# Patient Record
Sex: Female | Born: 1950
Health system: Southern US, Community
[De-identification: ages and names within clinical notes are randomized; demographics above are authoritative.]

## PROBLEM LIST (undated history)

## (undated) DIAGNOSIS — L509 Urticaria, unspecified: Secondary | ICD-10-CM

## (undated) DIAGNOSIS — R55 Syncope and collapse: Secondary | ICD-10-CM

## (undated) DIAGNOSIS — M1990 Unspecified osteoarthritis, unspecified site: Secondary | ICD-10-CM

## (undated) DIAGNOSIS — I6523 Occlusion and stenosis of bilateral carotid arteries: Secondary | ICD-10-CM

## (undated) DIAGNOSIS — E119 Type 2 diabetes mellitus without complications: Secondary | ICD-10-CM

## (undated) DIAGNOSIS — K219 Gastro-esophageal reflux disease without esophagitis: Secondary | ICD-10-CM

## (undated) DIAGNOSIS — E669 Obesity, unspecified: Secondary | ICD-10-CM

## (undated) DIAGNOSIS — D649 Anemia, unspecified: Secondary | ICD-10-CM

## (undated) DIAGNOSIS — I10 Essential (primary) hypertension: Secondary | ICD-10-CM

## (undated) DIAGNOSIS — R011 Cardiac murmur, unspecified: Secondary | ICD-10-CM

## (undated) DIAGNOSIS — H9319 Tinnitus, unspecified ear: Secondary | ICD-10-CM

## (undated) HISTORY — DX: Gastro-esophageal reflux disease without esophagitis: K21.9

## (undated) HISTORY — DX: Essential (primary) hypertension: I10

## (undated) HISTORY — DX: Obesity, unspecified: E66.9

## (undated) HISTORY — DX: Syncope and collapse: R55

## (undated) HISTORY — DX: Urticaria, unspecified: L50.9

---

## 1985-10-24 HISTORY — PX: TUBAL LIGATION: SHX77

## 1993-04-20 DIAGNOSIS — E669 Obesity, unspecified: Secondary | ICD-10-CM

## 1993-04-20 HISTORY — DX: Obesity, unspecified: E66.9

## 1993-11-11 DIAGNOSIS — I10 Essential (primary) hypertension: Secondary | ICD-10-CM

## 1993-11-11 HISTORY — DX: Essential (primary) hypertension: I10

## 2000-01-26 DIAGNOSIS — E1165 Type 2 diabetes mellitus with hyperglycemia: Secondary | ICD-10-CM | POA: Insufficient documentation

## 2001-04-02 ENCOUNTER — Other Ambulatory Visit: Admission: RE | Admit: 2001-04-02 | Discharge: 2001-04-02 | Payer: Self-pay | Admitting: Family Medicine

## 2001-04-02 DIAGNOSIS — K219 Gastro-esophageal reflux disease without esophagitis: Secondary | ICD-10-CM

## 2001-04-02 HISTORY — DX: Gastro-esophageal reflux disease without esophagitis: K21.9

## 2004-11-29 ENCOUNTER — Ambulatory Visit: Payer: Self-pay | Admitting: General Surgery

## 2005-12-01 DIAGNOSIS — D509 Iron deficiency anemia, unspecified: Secondary | ICD-10-CM | POA: Insufficient documentation

## 2006-02-10 ENCOUNTER — Ambulatory Visit: Payer: Self-pay | Admitting: General Surgery

## 2006-05-26 ENCOUNTER — Ambulatory Visit: Payer: Self-pay | Admitting: Internal Medicine

## 2008-11-25 DIAGNOSIS — Z8249 Family history of ischemic heart disease and other diseases of the circulatory system: Secondary | ICD-10-CM | POA: Insufficient documentation

## 2008-11-28 DIAGNOSIS — R519 Headache, unspecified: Secondary | ICD-10-CM | POA: Insufficient documentation

## 2008-12-02 ENCOUNTER — Ambulatory Visit: Payer: Self-pay | Admitting: Family Medicine

## 2008-12-08 ENCOUNTER — Ambulatory Visit: Payer: Self-pay | Admitting: Family Medicine

## 2009-01-20 ENCOUNTER — Ambulatory Visit: Payer: Self-pay

## 2009-01-20 DIAGNOSIS — R55 Syncope and collapse: Secondary | ICD-10-CM

## 2009-01-20 HISTORY — DX: Syncope and collapse: R55

## 2009-02-17 ENCOUNTER — Ambulatory Visit: Payer: Self-pay | Admitting: General Surgery

## 2009-02-17 LAB — HM COLONOSCOPY

## 2009-04-15 ENCOUNTER — Ambulatory Visit: Payer: Self-pay | Admitting: Family Medicine

## 2009-04-16 ENCOUNTER — Ambulatory Visit: Payer: Self-pay | Admitting: Family Medicine

## 2009-06-24 DIAGNOSIS — D649 Anemia, unspecified: Secondary | ICD-10-CM | POA: Insufficient documentation

## 2009-06-24 DIAGNOSIS — R141 Gas pain: Secondary | ICD-10-CM | POA: Insufficient documentation

## 2010-01-12 DIAGNOSIS — R0989 Other specified symptoms and signs involving the circulatory and respiratory systems: Secondary | ICD-10-CM | POA: Insufficient documentation

## 2010-01-13 ENCOUNTER — Ambulatory Visit: Payer: Self-pay

## 2010-03-01 ENCOUNTER — Ambulatory Visit: Payer: Self-pay | Admitting: Gastroenterology

## 2010-03-09 ENCOUNTER — Ambulatory Visit: Payer: Self-pay | Admitting: Gastroenterology

## 2010-03-12 DIAGNOSIS — R634 Abnormal weight loss: Secondary | ICD-10-CM | POA: Insufficient documentation

## 2010-03-12 DIAGNOSIS — R209 Unspecified disturbances of skin sensation: Secondary | ICD-10-CM | POA: Insufficient documentation

## 2010-06-17 HISTORY — PX: OTHER SURGICAL HISTORY: SHX169

## 2012-03-21 ENCOUNTER — Ambulatory Visit: Payer: Self-pay | Admitting: Family Medicine

## 2013-02-07 ENCOUNTER — Ambulatory Visit: Payer: Self-pay | Admitting: Family Medicine

## 2013-08-22 ENCOUNTER — Ambulatory Visit: Payer: Self-pay | Admitting: Family Medicine

## 2015-03-30 ENCOUNTER — Other Ambulatory Visit: Payer: Self-pay | Admitting: Family Medicine

## 2015-03-30 DIAGNOSIS — I10 Essential (primary) hypertension: Secondary | ICD-10-CM

## 2015-03-30 DIAGNOSIS — E7800 Pure hypercholesterolemia, unspecified: Secondary | ICD-10-CM | POA: Insufficient documentation

## 2015-04-29 ENCOUNTER — Telehealth: Payer: Self-pay

## 2015-04-29 ENCOUNTER — Encounter: Payer: Self-pay | Admitting: Family Medicine

## 2015-04-29 ENCOUNTER — Ambulatory Visit: Payer: Self-pay | Admitting: Physician Assistant

## 2015-04-29 ENCOUNTER — Ambulatory Visit (INDEPENDENT_AMBULATORY_CARE_PROVIDER_SITE_OTHER): Payer: 59 | Admitting: Family Medicine

## 2015-04-29 VITALS — BP 128/70 | HR 60 | Temp 98.1°F | Resp 16 | Ht 64.0 in | Wt 177.0 lb

## 2015-04-29 DIAGNOSIS — H9313 Tinnitus, bilateral: Secondary | ICD-10-CM | POA: Insufficient documentation

## 2015-04-29 DIAGNOSIS — R079 Chest pain, unspecified: Secondary | ICD-10-CM | POA: Diagnosis not present

## 2015-04-29 DIAGNOSIS — R591 Generalized enlarged lymph nodes: Secondary | ICD-10-CM | POA: Insufficient documentation

## 2015-04-29 DIAGNOSIS — D709 Neutropenia, unspecified: Secondary | ICD-10-CM | POA: Diagnosis not present

## 2015-04-29 DIAGNOSIS — F321 Major depressive disorder, single episode, moderate: Secondary | ICD-10-CM | POA: Diagnosis not present

## 2015-04-29 DIAGNOSIS — K219 Gastro-esophageal reflux disease without esophagitis: Secondary | ICD-10-CM

## 2015-04-29 DIAGNOSIS — E7800 Pure hypercholesterolemia, unspecified: Secondary | ICD-10-CM | POA: Insufficient documentation

## 2015-04-29 DIAGNOSIS — F419 Anxiety disorder, unspecified: Secondary | ICD-10-CM | POA: Insufficient documentation

## 2015-04-29 DIAGNOSIS — R58 Hemorrhage, not elsewhere classified: Secondary | ICD-10-CM | POA: Insufficient documentation

## 2015-04-29 DIAGNOSIS — R599 Enlarged lymph nodes, unspecified: Secondary | ICD-10-CM | POA: Insufficient documentation

## 2015-04-29 DIAGNOSIS — E559 Vitamin D deficiency, unspecified: Secondary | ICD-10-CM | POA: Insufficient documentation

## 2015-04-29 DIAGNOSIS — R0989 Other specified symptoms and signs involving the circulatory and respiratory systems: Secondary | ICD-10-CM | POA: Insufficient documentation

## 2015-04-29 MED ORDER — RANITIDINE HCL 150 MG PO TABS: ORAL_TABLET | ORAL | Status: DC

## 2015-04-29 NOTE — Progress Notes (Signed)
Subjective:    Patient ID: Virginia Weaver, female    DOB: 1951-07-12, 64 y.o.   MRN: [id number]  Chest Pain  This is a new problem. The current episode started in the past 7 days (Has been going on for about three days. ). The onset quality is sudden. The problem occurs daily (The last three days, worse at night or late afternoon. ). The problem has been waxing and waning. The pain is present in the substernal region. The pain is at a severity of 1/10 (Last night was 8/10.). The pain is mild. The quality of the pain is described as pressure. The pain radiates to the lower back. Associated symptoms include back pain, a cough, dizziness, headaches, numbness (Left arm, but only yesterday.) and palpitations. Pertinent negatives include no abdominal pain, diaphoresis, fever, nausea, shortness of breath, vomiting or weakness. The pain is aggravated by nothing. She has tried antacids for the symptoms. The treatment provided no relief.  Pertinent negatives for past medical history include no seizures.   Patient Active Problem List   Diagnosis Date Noted  . Anxiety 04/29/2015  . Weak pulse 04/29/2015  . Ecchymosis 04/29/2015  . Adenopathy 04/29/2015  . Depression, major, single episode, moderate 04/29/2015  . Hypercholesterolemia without hypertriglyceridemia 04/29/2015  . Bilateral tinnitus 04/29/2015  . Avitaminosis D 04/29/2015  . Hypertension 03/30/2015  . Hypercholesteremia 03/30/2015  . Abnormal loss of weight 03/12/2010  . Disturbance of skin sensation 03/12/2010  . Cardiovascular symptoms 01/12/2010  . Absolute anemia 06/24/2009  . Flatulence, eructation and gas pain 06/24/2009  . Syncope and collapse 01/20/2009  . Cephalalgia 11/28/2008  . Fam hx-ischem heart disease 11/25/2008  . Anemia, iron deficiency 12/01/2005  . Acid reflux 04/02/2001  . Diabetes mellitus type 2, uncontrolled 01/26/2000  . Benign essential HTN 11/11/1993  . Adiposity 04/20/1993   Family History  Problem  Relation Age of Onset  . Heart attack Mother   . Stroke Mother   . Diabetes Mother   . Throat cancer Mother   . Heart attack Father   . Hypertension Father   . Glaucoma Father   . AAA (abdominal aortic aneurysm) Father   . Obesity Sister   . Throat cancer Maternal Grandmother   . Heart attack Maternal Grandfather   . CVA Paternal Grandmother   . Congestive Heart Failure Paternal Grandmother   . Heart attack Paternal Grandfather    History   Social History  . Marital Status: Widowed    Spouse Name: N/A  . Number of Children: 2  . Years of Education: H/S   Occupational History  . [employer] [place] History Main Topics  . Smoking status: Never Smoker   . Smokeless tobacco: Never Used  . Alcohol Use: No  . Drug Use: No  . Sexual Activity: Not on file   Other Topics Concern  . Not on file   Social History Narrative   Past Surgical History  Procedure Laterality Date  . Tubal ligation  1987  . Tummy tuck  06/17/2010   Allergies  Allergen Reactions  . Nsaids     Can't take NSAIDS secondary to erosions due to NSAIDS.   Previous Medications   ALPRAZOLAM (XANAX) 0.5 MG TABLET    Take 1 tablet by mouth every 8 (eight) hours as needed.   BIOFLAVONOID PRODUCTS PO    1 tablet daily.   CALCIUM CARBONATE-VITAMIN D 600-200 MG-UNIT CAPS    1 tablet daily.  CHOLECALCIFEROL 1000 UNITS CAPSULE    Take 1 capsule by mouth daily.   CYANOCOBALAMIN 1000 MCG TABLET    Take 1 tablet by mouth daily.   HORSE CHESTNUT 300 MG CAPS    Take 1 capsule by mouth 2 (two) times daily.   LEVOCETIRIZINE (XYZAL) 5 MG TABLET    Take 1 tablet by mouth daily.   MONTELUKAST (SINGULAIR) 10 MG TABLET    Take 10 mg by mouth daily.   OMEGA-3 FATTY ACIDS PO    Take 1 capsule by mouth daily.   PRAVASTATIN (PRAVACHOL) 20 MG TABLET    TAKE 1 TABLET BY MOUTH AT BEDTIME   RAMIPRIL (ALTACE) 2.5 MG CAPSULE    TAKE 1 CAPSULE BY MOUTH EVERY DAY   SERTRALINE (ZOLOFT) 50 MG TABLET    Take  50 mg by mouth daily.   BP 128/70 mmHg  Pulse 60  Temp(Src) 98.1 F (36.7 C) (Oral)  Resp 16  Ht 5\' 4"  (1.626 m)  Wt 177 lb (80.287 kg)  BMI 30.37 kg/m2    Review of Systems  Constitutional: Positive for fatigue. Negative for fever, chills, diaphoresis, activity change, appetite change and unexpected weight change.  HENT: Positive for tinnitus.   Respiratory: Positive for cough and chest tightness. Negative for apnea, choking, shortness of breath, wheezing and stridor.   Cardiovascular: Positive for chest pain, palpitations and leg swelling.  Gastrointestinal: Negative for nausea, vomiting, abdominal pain, diarrhea, constipation, blood in stool, abdominal distention, anal bleeding and rectal pain.  Musculoskeletal: Positive for back pain.  Neurological: Positive for dizziness, numbness (Left arm, but only yesterday.) and headaches. Negative for tremors, seizures, syncope, facial asymmetry, speech difficulty, weakness and light-headedness.  Psychiatric/Behavioral: Negative for agitation. The patient is nervous/anxious (Father recently past away last week.  ).        Objective:   Physical Exam  Constitutional: She is oriented to person, place, and time. She appears well-developed and well-nourished.  Cardiovascular: Normal rate and regular rhythm.   Pulmonary/Chest: Effort normal and breath sounds normal.  Neurological: She is alert and oriented to person, place, and time.  Psychiatric: She has a normal mood and affect. Her behavior is normal. Thought content normal.   BP 128/70 mmHg  Pulse 60  Temp(Src) 98.1 F (36.7 C) (Oral)  Resp 16  Ht 5\' 4"  (1.626 m)  Wt 177 lb (80.287 kg)  BMI 30.37 kg/m2       Assessment & Plan:   1. Chest pain, unspecified chest pain type EKG normal today. No new symptoms. Will follow up with cardiologist as scheduled.  Will call if worsens or does not improve.  - EKG 12-Lead  2. Neutropenia Check labs.  - CBC with  Differential/Platelet  3. Gastroesophageal reflux disease without esophagitis Will start Zantac short term and taper as tolerated. Will call if worsens or does not improve.  4. Depression, major, single episode, moderate Currently stable. Will continue current medication.  Will call if does not continue to improve.    Margarita Rana, MD

## 2015-04-29 NOTE — Telephone Encounter (Signed)
Pt called c/o "tight" chest pain. Pt reports her dad recently died, and believes this pain is secondary to stress. Pt reports she is also experiencing left arm "tingling", and nausea (pt's baseline due to vertigo). Pt denies SOB, jaw pain, neck pain, crushing chest sensation, vomiting, cold sweats. Scheduled appointment with Dr. Venia Minks. Advised pt to go to ED if sx appear/worsen. Pt agrees. Renaldo Fiddler, CMA

## 2015-05-18 ENCOUNTER — Other Ambulatory Visit: Payer: Self-pay | Admitting: Family Medicine

## 2015-05-19 NOTE — Telephone Encounter (Signed)
She is requesting refill on Xanax.

## 2015-05-19 NOTE — Telephone Encounter (Signed)
Ok to call in rx.  Thanks.  

## 2015-06-04 ENCOUNTER — Ambulatory Visit (INDEPENDENT_AMBULATORY_CARE_PROVIDER_SITE_OTHER): Payer: 59 | Admitting: Family Medicine

## 2015-06-04 ENCOUNTER — Encounter: Payer: Self-pay | Admitting: Family Medicine

## 2015-06-04 VITALS — BP 134/66 | HR 58 | Temp 98.0°F | Resp 16 | Wt 178.4 lb

## 2015-06-04 DIAGNOSIS — L509 Urticaria, unspecified: Secondary | ICD-10-CM

## 2015-06-04 DIAGNOSIS — S8010XA Contusion of unspecified lower leg, initial encounter: Secondary | ICD-10-CM | POA: Insufficient documentation

## 2015-06-04 DIAGNOSIS — S8012XA Contusion of left lower leg, initial encounter: Secondary | ICD-10-CM

## 2015-06-04 HISTORY — DX: Urticaria, unspecified: L50.9

## 2015-06-04 MED ORDER — HYDROXYZINE HCL 25 MG PO TABS: 25.0000 mg | ORAL_TABLET | Freq: Three times a day (TID) | ORAL | Status: DC | PRN

## 2015-06-04 NOTE — Progress Notes (Signed)
Subjective:     Patient ID: Virginia Weaver, female   DOB: 07/19/1951, 64 y.o.   MRN: [id number]  HPI  Chief Complaint  Patient presents with  . Cyst    left calf, on June the 28th I hurted with the [employer]  . Follow-up    itching all over, hands, back of neck and feet, started about 3 weeks ago.  She reports increased duress in her life as her father was killed in a motor vehicle accident at the end of June. Reports intermittent itchy bumps have been occurring over the last 3 weeks. She had been cleaning out her dad's house when she bumped her calf with a lawnmower. Remains on Xyzal and takes Xanax once daily.   Review of Systems     Objective:   Physical Exam  Constitutional: She appears well-developed and well-nourished. No distress.  Pulmonary/Chest: Breath sounds normal.  Skin:  Left posterior calf with 4 cm tender mass c/w hematoma. Dependent ecchymosis is noted distally. Two non-specific papules are noted on her upper extremity.       Assessment:    1. Hematoma of leg, left, initial encounter  2. Urticaria: c/w stress/grief reaction - hydrOXYzine (ATARAX/VISTARIL) 25 MG tablet; Take 1 tablet (25 mg total) by mouth 3 (three) times daily as needed.  Dispense: 30 tablet; Refill: 0    Plan:    Continue Xyzal and increase Xanax to twice daily. May use hydroxyzine as needed for rash recurrence. Discussed warm compresses to left calf hematoma.

## 2015-06-04 NOTE — Patient Instructions (Signed)
Increase Xanax to twice daily. May use hydroxyzine as needed for hives. Continue Xyzal.

## 2015-06-05 ENCOUNTER — Telehealth: Payer: Self-pay

## 2015-06-05 LAB — CBC WITH DIFFERENTIAL/PLATELET
BASOS ABS: 0 10*3/uL (ref 0.0–0.2)
Basos: 1 %
EOS (ABSOLUTE): 0.2 10*3/uL (ref 0.0–0.4)
Eos: 4 %
Hematocrit: 41.3 % (ref 34.0–46.6)
Hemoglobin: 13.7 g/dL (ref 11.1–15.9)
IMMATURE GRANS (ABS): 0 10*3/uL (ref 0.0–0.1)
IMMATURE GRANULOCYTES: 0 %
LYMPHS: 28 %
Lymphocytes Absolute: 1.3 10*3/uL (ref 0.7–3.1)
MCH: 30.4 pg (ref 26.6–33.0)
MCHC: 33.2 g/dL (ref 31.5–35.7)
MCV: 92 fL (ref 79–97)
MONOCYTES: 8 %
Monocytes Absolute: 0.4 10*3/uL (ref 0.1–0.9)
Neutrophils Absolute: 2.8 10*3/uL (ref 1.4–7.0)
Neutrophils: 59 %
Platelets: 192 10*3/uL (ref 150–379)
RBC: 4.5 x10E6/uL (ref 3.77–5.28)
RDW: 12.9 % (ref 12.3–15.4)
WBC: 4.7 10*3/uL (ref 3.4–10.8)

## 2015-06-05 NOTE — Telephone Encounter (Signed)
-----   Message from Margarita Rana, MD sent at 06/05/2015  9:13 AM EDT ----- Labs stable.  Please notify patient. Thanks.

## 2015-06-05 NOTE — Telephone Encounter (Signed)
Advised pt of lab results. Pt verbally acknowledges understanding. Shrita Thien Drozdowski, CMA   

## 2015-06-18 ENCOUNTER — Other Ambulatory Visit: Payer: Self-pay | Admitting: Family Medicine

## 2015-07-30 ENCOUNTER — Ambulatory Visit (INDEPENDENT_AMBULATORY_CARE_PROVIDER_SITE_OTHER): Payer: 59 | Admitting: Family Medicine

## 2015-07-30 ENCOUNTER — Encounter: Payer: Self-pay | Admitting: Family Medicine

## 2015-07-30 VITALS — BP 104/58 | HR 60 | Temp 97.7°F | Resp 16 | Ht 64.0 in | Wt 185.4 lb

## 2015-07-30 DIAGNOSIS — M222X1 Patellofemoral disorders, right knee: Secondary | ICD-10-CM | POA: Diagnosis not present

## 2015-07-30 DIAGNOSIS — M7051 Other bursitis of knee, right knee: Secondary | ICD-10-CM | POA: Diagnosis not present

## 2015-07-30 MED ORDER — CELECOXIB 200 MG PO CAPS: 200.0000 mg | ORAL_CAPSULE | Freq: Every day | ORAL | Status: DC

## 2015-07-30 NOTE — Patient Instructions (Signed)
Continue with tylenol as needed and ice your knee for 20 minutes after you get home from work.

## 2015-07-30 NOTE — Progress Notes (Signed)
Subjective:     Patient ID: Virginia Weaver, female   DOB: 09/14/1951, 64 y.o.   MRN: [id number]  HPI  Chief Complaint  Patient presents with  . Knee Pain    Patient comes in office today with concerns of knee pain for several weeks. 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 Patient reports taking otc Tylenol   Continues to have pain with weight bearing especially when going up and down stairs and when she moves her knee laterally: "I have to lift my leg to get out of my car." Has been avoiding nsaid's due to remote hx of ulcer. No prior knee injury or surgery reported but remembers knee injections in the past.   Review of Systems  Musculoskeletal:       Wearing a knee sleeve today which does not help.       Objective:   Physical Exam  Constitutional: She appears well-developed and well-nourished. She appears distressed (mild due to knee pain).  Musculoskeletal:  Antalgic gait. Can not flex her right knee>  90 degrees without pain .Minimal effusion/no erythema. Tender over patella and right lateral knee. Ligaments stable with no increased pain on collateral ligament testing.       Assessment:    1. Patellofemoral disorder, right - celecoxib (CELEBREX) 200 MG capsule; Take 1 capsule (200 mg total) by mouth daily.  Dispense: 15 capsule; Refill: 0  2. Bursitis, knee, right - celecoxib (CELEBREX) 200 MG capsule; Take 1 capsule (200 mg total) by mouth daily.  Dispense: 15 capsule; Refill: 0    Plan:    Ice for 20 minutes after work. If not improving after a week to call for x-ray and referral.

## 2015-08-11 ENCOUNTER — Other Ambulatory Visit: Payer: Self-pay | Admitting: Family Medicine

## 2015-08-11 DIAGNOSIS — I10 Essential (primary) hypertension: Secondary | ICD-10-CM

## 2015-08-11 DIAGNOSIS — E7800 Pure hypercholesterolemia, unspecified: Secondary | ICD-10-CM

## 2015-09-28 ENCOUNTER — Other Ambulatory Visit: Payer: Self-pay | Admitting: Family Medicine

## 2015-11-10 ENCOUNTER — Ambulatory Visit (INDEPENDENT_AMBULATORY_CARE_PROVIDER_SITE_OTHER): Payer: 59 | Admitting: Physician Assistant

## 2015-11-10 ENCOUNTER — Encounter: Payer: Self-pay | Admitting: Physician Assistant

## 2015-11-10 VITALS — BP 120/60 | HR 65 | Temp 97.6°F | Resp 16 | Wt 191.4 lb

## 2015-11-10 DIAGNOSIS — R300 Dysuria: Secondary | ICD-10-CM | POA: Diagnosis not present

## 2015-11-10 DIAGNOSIS — N390 Urinary tract infection, site not specified: Secondary | ICD-10-CM | POA: Diagnosis not present

## 2015-11-10 DIAGNOSIS — IMO0001 Reserved for inherently not codable concepts without codable children: Secondary | ICD-10-CM

## 2015-11-10 DIAGNOSIS — L409 Psoriasis, unspecified: Secondary | ICD-10-CM | POA: Diagnosis not present

## 2015-11-10 DIAGNOSIS — R350 Frequency of micturition: Secondary | ICD-10-CM

## 2015-11-10 LAB — POCT URINALYSIS DIPSTICK
Bilirubin, UA: NEGATIVE
Glucose, UA: NEGATIVE
KETONES UA: NEGATIVE
Leukocytes, UA: NEGATIVE
Nitrite, UA: NEGATIVE
PH UA: 5
PROTEIN UA: NEGATIVE
RBC UA: NEGATIVE
SPEC GRAV UA: 1.015
UROBILINOGEN UA: 0.2

## 2015-11-10 LAB — POCT GLYCOSYLATED HEMOGLOBIN (HGB A1C): HEMOGLOBIN A1C: 5.6

## 2015-11-10 MED ORDER — TRIAMCINOLONE ACETONIDE 0.1 % EX CREA: 1.0000 "application " | TOPICAL_CREAM | Freq: Two times a day (BID) | CUTANEOUS | Status: DC

## 2015-11-10 MED ORDER — PHENAZOPYRIDINE HCL 200 MG PO TABS: 200.0000 mg | ORAL_TABLET | Freq: Three times a day (TID) | ORAL | Status: DC | PRN

## 2015-11-10 NOTE — Progress Notes (Signed)
Patient: Virginia Weaver Female    DOB: [birth year]   65 y.o.   MRN: [id number] Visit Date: 11/10/2015  Today's Provider: [name], PA-C   Chief Complaint  Patient presents with  . Urinary Tract Infection    Patient concer about burning when voiding and frequency   Subjective:    Urinary Tract Infection  This is a new problem. Episode onset: Has been going for a while at least 6 months. The problem occurs every urination. The quality of the pain is described as burning. The pain is at a severity of 8/10. There has been no fever. Associated symptoms include frequency and urgency. Pertinent negatives include no discharge, flank pain, hematuria, nausea or vomiting. She has tried increased fluids and acetaminophen (Patient thinks needs a referral to the Urologist) for the symptoms. The treatment provided no relief.  She has had multiple urine checks which have all been negative for UTI.    Allergies  Allergen Reactions  . Nsaids     Can't take NSAIDS secondary to erosions due to NSAIDS.   Previous Medications   ALPRAZOLAM (XANAX) 0.5 MG TABLET    TAKE 1 TABLET BY MOUTH EVERY 8 HOURS AS NEEDED   BIOFLAVONOID PRODUCTS PO    1 tablet daily.   CALCIUM CARBONATE-VITAMIN D 600-200 MG-UNIT CAPS    1 tablet daily.   CELECOXIB (CELEBREX) 200 MG CAPSULE    Take 1 capsule (200 mg total) by mouth daily.   CHOLECALCIFEROL 1000 UNITS CAPSULE    Take 1 capsule by mouth daily.   CYANOCOBALAMIN 1000 MCG TABLET    Take 1 tablet by mouth daily.   HORSE CHESTNUT 300 MG CAPS    Take 1 capsule by mouth 2 (two) times daily.   HYDROXYZINE (ATARAX/VISTARIL) 25 MG TABLET    Take 1 tablet (25 mg total) by mouth every 8 (eight) hours as needed (for hives/itching).   LEVOCETIRIZINE (XYZAL) 5 MG TABLET    Take 1 tablet by mouth daily.   MONTELUKAST (SINGULAIR) 10 MG TABLET    Take 10 mg by mouth daily.   OMEGA-3 FATTY ACIDS PO    Take 1 capsule by mouth daily.   PRAVASTATIN (PRAVACHOL) 20 MG  TABLET    TAKE 1 TABLET BY MOUTH AT BEDTIME   RAMIPRIL (ALTACE) 2.5 MG CAPSULE    TAKE 1 CAPSULE BY MOUTH EVERY DAY   RANITIDINE (ZANTAC) 150 MG TABLET    Two times daily for 1 to 2 weeks and then taper as tolerated.   SERTRALINE (ZOLOFT) 50 MG TABLET    Take 50 mg by mouth daily.    Review of Systems  Constitutional: Negative.   HENT: Negative.   Eyes: Negative.   Respiratory: Negative.   Cardiovascular: Negative.   Gastrointestinal: Negative for nausea, vomiting, abdominal pain, diarrhea and constipation.  Endocrine: Negative.   Genitourinary: Positive for dysuria, urgency and frequency. Negative for hematuria, flank pain, decreased urine volume, vaginal bleeding, vaginal discharge, enuresis, difficulty urinating, genital sores, vaginal pain, menstrual problem and pelvic pain.  Musculoskeletal: Negative.   Skin: Positive for rash (psoriasis).  Allergic/Immunologic: Negative.   Neurological: Negative.   Hematological: Negative.   Psychiatric/Behavioral: Negative.     Social History  Substance Use Topics  . Smoking status: Never Smoker   . Smokeless tobacco: Never Used  . Alcohol Use: No   Objective:   BP 120/60 mmHg  Pulse 65  Temp(Src) 97.6 F (36.4 C) (Oral)  Resp 16  Wt 191 lb 6.4 oz (86.818 kg)  SpO2 97%  Physical Exam  Constitutional: She is oriented to person, place, and time. She appears well-developed and well-nourished. No distress.  Cardiovascular: Normal rate, regular rhythm and normal heart sounds.  Exam reveals no gallop and no friction rub.   No murmur heard. Pulmonary/Chest: Effort normal and breath sounds normal. No respiratory distress. She has no wheezes. She has no rales.  Abdominal: Soft. Normal appearance and bowel sounds are normal. She exhibits no shifting dullness, no distension, no abdominal bruit, no ascites, no pulsatile midline mass and no mass. There is no hepatosplenomegaly. There is no tenderness. There is no rebound, no guarding and no CVA  tenderness.  Neurological: She is alert and oriented to person, place, and time.  Skin: Skin is warm and dry. Rash noted. She is not diaphoretic.     Vitals reviewed.       Assessment & Plan:     1. Urinary tract infection without hematuria, site unspecified UA was normal.  Will still send for culture due to symptoms. Will give pyridium for discomfort to see if that helps.  She is asking for referral to Urology for further evaluation.  I did discuss with her it could be OAB, interstitial cystitis, or estrogen deficient vaginitis as well.  I will refer her to Urology for further evaluation and treatment. I will add antibiotic treatment if necessary depending on culture results. She is to call if symptoms worsen in the meantime.  - POCT urinalysis dipstick  2. Dysuria See above medical treatment plan. - Urine culture - Ambulatory referral to Urology - phenazopyridine (PYRIDIUM) 200 MG tablet; Take 1 tablet (200 mg total) by mouth 3 (three) times daily as needed for pain.  Dispense: 10 tablet; Refill: 0  3. Frequency See above medical treatment plan. HgBA1c was 5.6. - POCT HgB A1C  4. Psoriasis Will give triamcinolone cream for annular psoriasis flare that is located on left upper arm.  She is to call if symptoms worsen. - triamcinolone cream (KENALOG) 0.1 %; Apply 1 application topically 2 (two) times daily.  Dispense: 30 g; Refill: 0       [name], PA-C  [facility]

## 2015-11-10 NOTE — Patient Instructions (Addendum)
Interstitial Cystitis Interstitial cystitis is a condition that causes inflammation of the bladder. The bladder is a hollow organ in the lower part of your abdomen. It stores urine after the urine is made by your kidneys. With interstitial cystitis, you may have pain in the bladder area. You may also have a frequent and urgent need to urinate. The severity of interstitial cystitis can vary from person to person. You may have flare-ups of the condition, and then it may go away for a while. For many people who have this condition, it becomes a long-term problem. CAUSES The cause of this condition is not known. RISK FACTORS This condition is more likely to develop in women. SYMPTOMS Symptoms of interstitial cystitis vary, and they can change over time. Symptoms may include:  Discomfort or pain in the bladder area. This can range from mild to severe. The pain may change in intensity as the bladder fills with urine or as it empties.  Pelvic pain.  An urgent need to urinate.  Frequent urination.  Pain during sexual intercourse.  Pinpoint bleeding on the bladder wall. For women, the symptoms often get worse during menstruation. DIAGNOSIS This condition is diagnosed by evaluating your symptoms and ruling out other causes. A physical exam will be done. Various tests may be done to rule out other conditions. Common tests include:  Urine tests.  Cystoscopy. In this test, a tool that is like a very thin telescope is used to look into your bladder.  Biopsy. This involves taking a sample of tissue from the bladder wall to be examined under a microscope. TREATMENT There is no cure for interstitial cystitis, but treatment methods are available to control your symptoms. Work closely with your health care provider to find the treatments that will be most effective for you. Treatment options may include:  Medicines to relieve pain and to help reduce the number of times that you feel the need to  urinate.  Bladder training. This involves learning ways to control when you urinate, such as:  Urinating at scheduled times.  Training yourself to delay urination.  Doing exercises (Kegel exercises) to strengthen the muscles that control urine flow.  Lifestyle changes, such as changing your diet or taking steps to control stress.  Use of a device that provides electrical stimulation in order to reduce pain.  A procedure that stretches your bladder by filling it with air or fluid.  Surgery. This is rare. It is only done for extreme cases if other treatments do not help. HOME CARE INSTRUCTIONS  Take medicines only as directed by your health care provider.  Use bladder training techniques as directed.  Keep a bladder diary to find out which foods, liquids, or activities make your symptoms worse.  Use your bladder diary to schedule bathroom trips. If you are away from home, plan to be near a bathroom at each of your scheduled times.  Make sure you urinate just before you leave the house and just before you go to bed.  Do Kegel exercises as directed by your health care provider.  Do not drink alcohol.  Do not use any tobacco products, including cigarettes, chewing tobacco, or electronic cigarettes. If you need help quitting, ask your health care provider.  Make dietary changes as directed by your health care provider. You may need to avoid spicy foods and foods that contain a high amount of potassium.  Limit your drinking of beverages that stimulate urination. These include soda, coffee, and tea.  Keep all follow-up   visits as directed by your health care provider. This is important. SEEK MEDICAL CARE IF:  Your symptoms do not get better after treatment.  Your pain and discomfort are getting worse.  You have more frequent urges to urinate.  You have a fever. SEEK IMMEDIATE MEDICAL CARE IF:  You are not able to control your bladder at all.   This information is not  intended to replace advice given to you by your health care provider. Make sure you discuss any questions you have with your health care provider.   Document Released: 06/10/2004 Document Revised: 10/31/2014 Document Reviewed: 06/17/2014 Elsevier Interactive Patient Education 2016 [employer]  Dysuria Dysuria is pain or discomfort while urinating. The pain or discomfort may be felt in the tube that carries urine out of the bladder (urethra) or in the surrounding tissue of the genitals. The pain may also be felt in the groin area, lower abdomen, and lower back. You may have to urinate frequently or have the sudden feeling that you have to urinate (urgency). Dysuria can affect both men and women, but is more common in women. Dysuria can be caused by many different things, including:  Urinary tract infection in women.  Infection of the kidney or bladder.  Kidney stones or bladder stones.  Certain sexually transmitted infections (STIs), such as chlamydia.  Dehydration.  Inflammation of the vagina.  Use of certain medicines.  Use of certain soaps or scented products that cause irritation. HOME CARE INSTRUCTIONS Watch your dysuria for any changes. The following actions may help to reduce any discomfort you are feeling:  Drink enough fluid to keep your urine clear or pale yellow.  Empty your bladder often. Avoid holding urine for long periods of time.  After a bowel movement or urination, women should cleanse from front to back, using each tissue only once.  Empty your bladder after sexual intercourse.  Take medicines only as directed by your health care provider.  If you were prescribed an antibiotic medicine, finish it all even if you start to feel better.  Avoid caffeine, tea, and alcohol. They can irritate the bladder and make dysuria worse. In men, alcohol may irritate the prostate.  Keep all follow-up visits as directed by your health care provider. This is important.  If  you had any tests done to find the cause of dysuria, it is your responsibility to obtain your test results. Ask the lab or department performing the test when and how you will get your results. Talk with your health care provider if you have any questions about your results. SEEK MEDICAL CARE IF:  You develop pain in your back or sides.  You have a fever.  You have nausea or vomiting.  You have blood in your urine.  You are not urinating as often as you usually do. SEEK IMMEDIATE MEDICAL CARE IF:  You pain is severe and not relieved with medicines.  You are unable to hold down any fluids.  You or someone else notices a change in your mental function.  You have a rapid heartbeat at rest.  You have shaking or chills.  You feel extremely weak.   This information is not intended to replace advice given to you by your health care provider. Make sure you discuss any questions you have with your health care provider.   Document Released: 07/08/2004 Document Revised: 10/31/2014 Document Reviewed: 06/05/2014 Elsevier Interactive Patient Education [employer].

## 2015-11-11 LAB — URINE CULTURE

## 2015-11-11 LAB — PLEASE NOTE

## 2015-11-12 ENCOUNTER — Ambulatory Visit (INDEPENDENT_AMBULATORY_CARE_PROVIDER_SITE_OTHER): Payer: 59 | Admitting: Obstetrics and Gynecology

## 2015-11-12 ENCOUNTER — Telehealth: Payer: Self-pay

## 2015-11-12 ENCOUNTER — Encounter: Payer: Self-pay | Admitting: Obstetrics and Gynecology

## 2015-11-12 VITALS — BP 132/75 | HR 60 | Resp 16 | Ht 64.0 in | Wt 191.8 lb

## 2015-11-12 DIAGNOSIS — R3129 Other microscopic hematuria: Secondary | ICD-10-CM | POA: Diagnosis not present

## 2015-11-12 DIAGNOSIS — R300 Dysuria: Secondary | ICD-10-CM | POA: Diagnosis not present

## 2015-11-12 LAB — URINALYSIS, COMPLETE

## 2015-11-12 LAB — MICROSCOPIC EXAMINATION: EPITHELIAL CELLS (NON RENAL): NONE SEEN /HPF (ref 0–10)

## 2015-11-12 LAB — BLADDER SCAN AMB NON-IMAGING: Scan Result: 14

## 2015-11-12 MED ORDER — ESTROGENS, CONJUGATED 0.625 MG/GM VA CREA: TOPICAL_CREAM | VAGINAL | Status: DC

## 2015-11-12 NOTE — Progress Notes (Signed)
11/12/2015 1:30 PM   Virginia Weaver 1950-12-31 [id number]  Referring provider: Margarita Rana, MD [address] [place], [place] [address]  Chief Complaint  Patient presents with  . Dysuria  . Establish Care    HPI: Patient is a 65 year old female presenting today as a referral from her primary care provider for symptoms of urinary frequency and dysuria over the last 6 months. Her PCP note patient has repeatedly been evaluated for UTIs but her cultures have been negative. Patient also complains of nocturia at least every hour per night. She denies any gross hematuria, fevers or flank pain. She does not have a history of kidney stones though her daughter does suffer from recurrent episodes of stones. He has tried to increase her fluids and take over the counter Tylenol for her discomfort without relief. No other exacerbating or alleviating factors.  Report mild vaginal irritation.  Never smoker.  No known chemical exposures.   PMH: History reviewed. No pertinent past medical history.  Surgical History: Past Surgical History  Procedure Laterality Date  . Tubal ligation  1987  . Tummy tuck  06/17/2010    Home Medications:    Medication List       This list is accurate as of: 11/12/15  1:30 PM.  Always use your most recent med list.               ALPRAZolam 0.5 MG tablet  Commonly known as:  XANAX  TAKE 1 TABLET BY MOUTH EVERY 8 HOURS AS NEEDED     BIOFLAVONOID PRODUCTS PO  1 tablet daily.     Calcium Carbonate-Vitamin D 600-200 MG-UNIT Caps  1 tablet daily.     Cholecalciferol 1000 units capsule  Take 1 capsule by mouth daily.     conjugated estrogens vaginal cream  Commonly known as:  PREMARIN  Vaginal Estrogen Cream apply a blueberry sized amount to vaginal opening using finger tip nightly for 2 weeks then use 3 times weekly before bed.     cyanocobalamin 1000 MCG tablet  Take 1 tablet by mouth daily.     [place] 300 MG Caps  Take 1  capsule by mouth 2 (two) times daily.     hydrOXYzine 25 MG tablet  Commonly known as:  ATARAX/VISTARIL  Take 1 tablet (25 mg total) by mouth every 8 (eight) hours as needed (for hives/itching).     levocetirizine 5 MG tablet  Commonly known as:  XYZAL  Take 1 tablet by mouth daily.     montelukast 10 MG tablet  Commonly known as:  SINGULAIR  Take 10 mg by mouth daily.     OMEGA-3 FATTY ACIDS PO  Take 1 capsule by mouth daily.     pravastatin 20 MG tablet  Commonly known as:  PRAVACHOL  TAKE 1 TABLET BY MOUTH AT BEDTIME     ramipril 2.5 MG capsule  Commonly known as:  ALTACE  TAKE 1 CAPSULE BY MOUTH EVERY DAY     ranitidine 150 MG tablet  Commonly known as:  ZANTAC  Two times daily for 1 to 2 weeks and then taper as tolerated.     triamcinolone cream 0.1 %  Commonly known as:  KENALOG  Apply 1 application topically 2 (two) times daily.        Allergies:  Allergies  Allergen Reactions  . Nsaids     Can't take NSAIDS secondary to erosions due to NSAIDS.    Family History: Family History  Problem Relation Age  of Onset  . Heart attack Mother   . Stroke Mother   . Diabetes Mother   . Throat cancer Mother   . Heart attack Father   . Hypertension Father   . Glaucoma Father   . AAA (abdominal aortic aneurysm) Father   . Obesity Sister   . Throat cancer Maternal Grandmother   . Heart attack Maternal Grandfather   . CVA Paternal Grandmother   . Congestive Heart Failure Paternal Grandmother   . Heart attack Paternal Grandfather     Social History:  reports that she has never smoked. She has never used smokeless tobacco. She reports that she does not drink alcohol or use illicit drugs.  ROS: UROLOGY Frequent Urination?: Yes Hard to postpone urination?: Yes Burning/pain with urination?: Yes Get up at night to urinate?: Yes Leakage of urine?: No Urine stream starts and stops?: Yes Trouble starting stream?: No Do you have to strain to urinate?: Yes Blood in  urine?: No Urinary tract infection?: No Sexually transmitted disease?: No Injury to kidneys or bladder?: No Painful intercourse?: No Weak stream?: No Currently pregnant?: No Vaginal bleeding?: No Last menstrual period?: n/a  Gastrointestinal Nausea?: No Vomiting?: No Indigestion/heartburn?: No Diarrhea?: No Constipation?: No  Constitutional Fever: No Night sweats?: No Weight loss?: No Fatigue?: No  Skin Skin rash/lesions?: Yes Itching?: No  Eyes Blurred vision?: No Double vision?: No  Ears/Nose/Throat Sore throat?: No Sinus problems?: Yes  Hematologic/Lymphatic Swollen glands?: No Easy bruising?: Yes  Cardiovascular Leg swelling?: Yes Chest pain?: No  Respiratory Cough?: No Shortness of breath?: No  Endocrine Excessive thirst?: No  Musculoskeletal Back pain?: No Joint pain?: No  Neurological Headaches?: Yes Dizziness?: Yes  Psychologic Depression?: No Anxiety?: Yes  Physical Exam: BP 132/75 mmHg  Pulse 60  Resp 16  Ht 5\' 4"  (1.626 m)  Wt 191 lb 12.8 oz (87 kg)  BMI 32.91 kg/m2  Constitutional:  Alert and oriented, No acute distress. HEENT: [place] AT, moist mucus membranes.  Trachea midline, no masses. Cardiovascular: No clubbing, cyanosis, or edema. Respiratory: Normal respiratory effort, no increased work of breathing. GI: Abdomen is soft, nontender, nondistended, no abdominal masses GU: No CVA tenderness.  Pelvic Exam: normal urinary meatus, atrophic vaginal mucosa, good vaginal vault support, no demonstrable SUI Skin: No rashes, bruises or suspicious lesions. Lymph: No cervical or inguinal adenopathy. Neurologic: Grossly intact, no focal deficits, moving all 4 extremities. Psychiatric: Normal mood and affect.  Laboratory Data:   Urinalysis Results for orders placed or performed in visit on 11/12/15  BLADDER SCAN AMB NON-IMAGING  Result Value Ref Range   Scan Result 14 mL      Pertinent Imaging: PVR 70mL  Assessment & Plan:     1. Dysuria- Possibly due to vaginal atrophy and/or other GU pathology. We will start vaginal estrogen as well as pursue a hematuria workup for further evaluation of microscopic hematuria. - Urinalysis, Complete - BLADDER SCAN AMB NON-IMAGING  2. Microsocpic hematuria- We discussed the differential diagnosis for microscopic hematuria including nephrolithiasis, renal or upper tract tumors, bladder stones, UTIs, or bladder tumors as well as undetermined etiologies. Per AUA guidelines, I did recommend complete microscopic hematuria evaluation including CTU, possible urine cytology, and office cystoscopy. -BMP  3. Vaginal Atrophy- Prescription for vaginal estrogen cream sent to patient's pharmacy.   Return for CT Urogram results and cystoscopy.  These notes generated with voice recognition software. I apologize for typographical errors.  Herbert Moors, [facility] [address], [place], [place]  [address] [phone]   

## 2015-11-12 NOTE — Telephone Encounter (Signed)
Patient advised as directed below.  Thanks,  -Joseline 

## 2015-11-12 NOTE — Patient Instructions (Signed)
Hematuria, Adult  Hematuria is blood in your urine. It can be caused by a bladder infection, kidney infection, prostate infection, kidney stone, or cancer of your urinary tract. Infections can usually be treated with medicine, and a kidney stone usually will pass through your urine. If neither of these is the cause of your hematuria, further workup to find out the reason may be needed.  It is very important that you tell your health care provider about any blood you see in your urine, even if the blood stops without treatment or happens without causing pain. Blood in your urine that happens and then stops and then happens again can be a symptom of a very serious condition. Also, pain is not a symptom in the initial stages of many urinary cancers.  HOME CARE INSTRUCTIONS   · Drink lots of fluid, 3-4 quarts a day. If you have been diagnosed with an infection, cranberry juice is especially recommended, in addition to large amounts of water.  · Avoid caffeine, tea, and carbonated beverages because they tend to irritate the bladder.  · Avoid alcohol because it may irritate the prostate.  · Take all medicines as directed by your health care provider.  · If you were prescribed an antibiotic medicine, finish it all even if you start to feel better.  · If you have been diagnosed with a kidney stone, follow your health care provider's instructions regarding straining your urine to catch the stone.  · Empty your bladder often. Avoid holding urine for long periods of time.  · After a bowel movement, women should cleanse front to back. Use each tissue only once.  · Empty your bladder before and after sexual intercourse if you are a female.  SEEK MEDICAL CARE IF:  · You develop back pain.  · You have a fever.  · You have a feeling of sickness in your stomach (nausea) or vomiting.  · Your symptoms are not better in 3 days. Return sooner if you are getting worse.  SEEK IMMEDIATE MEDICAL CARE IF:   · You develop severe vomiting and  are unable to keep the medicine down.  · You develop severe back or abdominal pain despite taking your medicines.  · You begin passing a large amount of blood or clots in your urine.  · You feel extremely weak or faint, or you pass out.  MAKE SURE YOU:   · Understand these instructions.  · Will watch your condition.  · Will get help right away if you are not doing well or get worse.     This information is not intended to replace advice given to you by your health care provider. Make sure you discuss any questions you have with your health care provider.     Document Released: 10/10/2005 Document Revised: 10/31/2014 Document Reviewed: 06/10/2013  Elsevier Interactive Patient Education ©2016 [employer]  Cystoscopy  Cystoscopy is a procedure that is used to help your caregiver diagnose and sometimes treat conditions that affect your lower urinary tract. Your lower urinary tract includes your bladder and the tube through which urine passes from your bladder out of your body (urethra). Cystoscopy is performed with a thin, tube-shaped instrument (cystoscope). The cystoscope has lenses and a light at the end so that your caregiver can see inside your bladder. The cystoscope is inserted at the entrance of your urethra. Your caregiver guides it through your urethra and into your bladder. There are two main types of cystoscopy:  · Flexible cystoscopy (with   a flexible cystoscope).  · Rigid cystoscopy (with a rigid cystoscope).  Cystoscopy may be recommended for many conditions, including:  · Urinary tract infections.  · Blood in your urine (hematuria).  · Loss of bladder control (urinary incontinence) or overactive bladder.  · Unusual cells found in a urine sample.  · Urinary blockage.  · Painful urination.  Cystoscopy may also be done to remove a sample of your tissue to be checked under a microscope (biopsy). It may also be done to remove or destroy bladder stones.  LET YOUR CAREGIVER KNOW ABOUT:  · Allergies to food or  medicine.  · Medicines taken, including vitamins, herbs, eyedrops, over-the-counter medicines, and creams.  · Use of steroids (by mouth or creams).  · Previous problems with anesthetics or numbing medicines.  · History of bleeding problems or blood clots.  · Previous surgery.  · Other health problems, including diabetes and kidney problems.  · Possibility of pregnancy, if this applies.  PROCEDURE  The area around the opening to your urethra will be cleaned. A medicine to numb your urethra (local anesthetic) is used. If a tissue sample or stone is removed during the procedure, you may be given a medicine to make you sleep (general anesthetic).  Your caregiver will gently insert the tip of the cystoscope into your urethra. The cystoscope will be slowly glided through your urethra and into your bladder. Sterile fluid will flow through the cystoscope and into your bladder. The fluid will expand and stretch your bladder. This gives your caregiver a better view of your bladder walls. The procedure lasts about 15-20 minutes.  AFTER THE PROCEDURE  If a local anesthetic is used, you will be allowed to go home as soon as you are ready. If a general anesthetic is used, you will be taken to a recovery area until you are stable. You may have temporary bleeding and burning on urination.     This information is not intended to replace advice given to you by your health care provider. Make sure you discuss any questions you have with your health care provider.     Document Released: 10/07/2000 Document Revised: 10/31/2014 Document Reviewed: 04/02/2012  Elsevier Interactive Patient Education ©2016 Elsevier Inc.

## 2015-11-12 NOTE — Telephone Encounter (Signed)
-----   Message from [name], [place] sent at 11/11/2015  4:49 PM EST ----- Culture did not grow any abnormal bacteria.

## 2015-11-13 LAB — BASIC METABOLIC PANEL
BUN / CREAT RATIO: 29 — AB (ref 11–26)
BUN: 23 mg/dL (ref 8–27)
CO2: 26 mmol/L (ref 18–29)
CREATININE: 0.78 mg/dL (ref 0.57–1.00)
Calcium: 9.5 mg/dL (ref 8.7–10.3)
Chloride: 102 mmol/L (ref 96–106)
GFR, EST AFRICAN AMERICAN: 94 mL/min/{1.73_m2} (ref >59)
GFR, EST NON AFRICAN AMERICAN: 82 mL/min/{1.73_m2} (ref >59)
Glucose: 96 mg/dL (ref 65–99)
POTASSIUM: 4.7 mmol/L (ref 3.5–5.2)
SODIUM: 142 mmol/L (ref 134–144)

## 2015-11-14 ENCOUNTER — Other Ambulatory Visit: Payer: Self-pay | Admitting: Family Medicine

## 2015-11-14 DIAGNOSIS — K219 Gastro-esophageal reflux disease without esophagitis: Secondary | ICD-10-CM

## 2015-11-16 NOTE — Telephone Encounter (Signed)
Dr. Maloney's pt.   Thanks,   -Laura  

## 2015-11-26 ENCOUNTER — Ambulatory Visit
Admission: RE | Admit: 2015-11-26 | Discharge: 2015-11-26 | Disposition: A | Discharge status: Home / Self Care | Payer: 59 | Source: Ambulatory Visit | Attending: Obstetrics and Gynecology | Admitting: Obstetrics and Gynecology

## 2015-11-26 DIAGNOSIS — R3129 Other microscopic hematuria: Secondary | ICD-10-CM | POA: Diagnosis not present

## 2015-11-26 DIAGNOSIS — R300 Dysuria: Secondary | ICD-10-CM

## 2015-11-26 HISTORY — DX: Essential (primary) hypertension: I10

## 2015-11-26 HISTORY — DX: Type 2 diabetes mellitus without complications: E11.9

## 2015-11-26 MED ORDER — IOHEXOL 350 MG/ML SOLN
125.0000 mL | Freq: Once | INTRAVENOUS | Status: AC | PRN
  Administered 2015-11-26: 125 mL via INTRAVENOUS

## 2015-11-30 ENCOUNTER — Telehealth: Payer: Self-pay | Admitting: Radiology

## 2015-11-30 ENCOUNTER — Encounter: Payer: Self-pay | Admitting: Urology

## 2015-11-30 ENCOUNTER — Ambulatory Visit (INDEPENDENT_AMBULATORY_CARE_PROVIDER_SITE_OTHER): Payer: 59 | Admitting: Urology

## 2015-11-30 VITALS — BP 125/79 | HR 60 | Ht 64.0 in | Wt 189.7 lb

## 2015-11-30 DIAGNOSIS — N3289 Other specified disorders of bladder: Secondary | ICD-10-CM | POA: Diagnosis not present

## 2015-11-30 DIAGNOSIS — R3129 Other microscopic hematuria: Secondary | ICD-10-CM | POA: Diagnosis not present

## 2015-11-30 LAB — MICROSCOPIC EXAMINATION: BACTERIA UA: NONE SEEN

## 2015-11-30 LAB — URINALYSIS, COMPLETE
Bilirubin, UA: NEGATIVE
GLUCOSE, UA: NEGATIVE
Ketones, UA: NEGATIVE
Leukocytes, UA: NEGATIVE
Nitrite, UA: NEGATIVE
PH UA: 5.5 (ref 5.0–7.5)
PROTEIN UA: NEGATIVE
Specific Gravity, UA: 1.02 (ref 1.005–1.030)
Urobilinogen, Ur: 0.2 mg/dL (ref 0.2–1.0)

## 2015-11-30 MED ORDER — CIPROFLOXACIN HCL 500 MG PO TABS
500.0000 mg | ORAL_TABLET | Freq: Once | ORAL | Status: AC
  Administered 2015-11-30: 500 mg via ORAL

## 2015-11-30 MED ORDER — LIDOCAINE HCL 2 % EX GEL
1.0000 "application " | Freq: Once | CUTANEOUS | Status: AC
  Administered 2015-11-30: 1 via URETHRAL

## 2015-11-30 NOTE — Telephone Encounter (Signed)
Notified pt of surgery scheduled 12/22/15, pre-admit testing appt on 2/17 @9 :30 and to call day prior to surgery for arrival time to SDS. Pt voices understanding.

## 2015-11-30 NOTE — Progress Notes (Signed)
Patient returns in continuing management of microscopic hematuria and irritative voiding symptoms. She was started on transvaginal estrogen cream.  She underwent CT hematuria protocol February 2017 with delayed imaging which was normal. I reviewed the images.  UA today shows 3-10 red blood cells per high-powered field.  Physical exam: Mare Ferrari was chaperone. Abdomen-soft nontender nondistended. No palpable masses. Lower midline scar from prior plastic surgery well healed.  GU-vaginal atrophy. No vaginal or vulvar masses. No prolapse. Bladder and urethra were palpably normal.   Cystoscopy: The patient was prepped and draped in the usual sterile fashion. Findings: The urethra appeared normal. The trigone and ureteral orifices were in their normal orthotopic position. There was clear efflux. Was no stone or foreign body in the bladder. There were no papillary tumors there were some bright areas of erythematous patches at the dome and one left posterior.  Assessment/plan: Microscopic hematuria-areas of bladder erythema and patches. Needs biopsy and fulguration. Urine cytology was also sent. I discussed with the patient the nature risks benefits and alternatives to cystoscopy with bladder biopsy and fulguration. All questions answered and she elects to proceed.  Dysuria, frequency-continue to monitor.  Pt has not started TV estrogen.

## 2015-11-30 NOTE — Addendum Note (Signed)
Addended by: Festus Aloe R on: 11/30/2015 10:20 AM   Modules accepted: Level of Service

## 2015-12-03 ENCOUNTER — Encounter: Payer: Self-pay | Admitting: Urology

## 2015-12-04 ENCOUNTER — Other Ambulatory Visit: Payer: Self-pay | Admitting: Physician Assistant

## 2015-12-04 DIAGNOSIS — R300 Dysuria: Secondary | ICD-10-CM

## 2015-12-09 MED ORDER — ACETAMINOPHEN 10 MG/ML IV SOLN
INTRAVENOUS | Status: AC
  Filled 2015-12-09: qty 100

## 2015-12-10 ENCOUNTER — Encounter: Payer: Self-pay | Admitting: Urology

## 2015-12-11 ENCOUNTER — Encounter
Admission: RE | Admit: 2015-12-11 | Discharge: 2015-12-11 | Disposition: A | Discharge status: Home / Self Care | Payer: 59 | Source: Ambulatory Visit | Attending: Urology | Admitting: Urology

## 2015-12-11 DIAGNOSIS — Z029 Encounter for administrative examinations, unspecified: Secondary | ICD-10-CM | POA: Diagnosis not present

## 2015-12-11 HISTORY — DX: Cardiac murmur, unspecified: R01.1

## 2015-12-11 HISTORY — DX: Tinnitus, unspecified ear: H93.19

## 2015-12-11 NOTE — Patient Instructions (Addendum)
  Your procedure is scheduled on: 12/22/15 Report to Day Surgery. [facility] SECOND FLOOR To find out your arrival time please call [phone] between 1PM - 3PM on 12/21/15.  Remember: Instructions that are not followed completely may result in serious medical risk, up to and including death, or upon the discretion of your surgeon and anesthesiologist your surgery may need to be rescheduled.    __X__ 1. Do not eat food or drink liquids after midnight. No gum chewing or hard candies.     __X__ 2. No Alcohol for 24 hours before or after surgery.   ____ 3. Bring all medications with you on the day of surgery if instructed.    __X__ 4. Notify your doctor if there is any change in your medical condition     (cold, fever, infections).     Do not wear jewelry, make-up, hairpins, clips or nail polish.  Do not wear lotions, powders, or perfumes. You may wear deodorant.  Do not shave 48 hours prior to surgery. Men may shave face and neck.  Do not bring valuables to the hospital.    [facility] is not responsible for any belongings or valuables.               Contacts, dentures or bridgework may not be worn into surgery.  Leave your suitcase in the car. After surgery it may be brought to your room.  For patients admitted to the hospital, discharge time is determined by your                treatment team.   Patients discharged the day of surgery will not be allowed to drive home.   Please read over the following fact sheets that you were given:   Surgical Site Infection Prevention   ____ Take these medicines the morning of surgery with A SIP OF WATER:    1. NONE  2.   3.   4.  5.  6.  ____ Fleet Enema (as directed)   ____ Use CHG Soap as directed  ____ Use inhalers on the day of surgery  ____ Stop metformin 2 days prior to surgery    ____ Take 1/2 of usual insulin dose the night before surgery and none on the morning of surgery.   ____ Stop Coumadin/Plavix/aspirin on  ____  Stop Anti-inflammatories on  __X__ Stop supplements until after surgery.    ____ Bring C-Pap to the hospital.

## 2015-12-21 ENCOUNTER — Other Ambulatory Visit: Payer: Self-pay | Admitting: Family Medicine

## 2015-12-22 ENCOUNTER — Ambulatory Visit: Payer: 59 | Admitting: Anesthesiology

## 2015-12-22 ENCOUNTER — Encounter: Payer: Self-pay | Admitting: *Deleted

## 2015-12-22 ENCOUNTER — Encounter
Admission: RE | Disposition: A | Discharge status: Home / Self Care | Payer: Self-pay | Source: Ambulatory Visit | Attending: Urology

## 2015-12-22 ENCOUNTER — Ambulatory Visit
Admission: RE | Admit: 2015-12-22 | Discharge: 2015-12-22 | Disposition: A | Discharge status: Home / Self Care | Payer: 59 | Source: Ambulatory Visit | Attending: Urology | Admitting: Urology

## 2015-12-22 DIAGNOSIS — I10 Essential (primary) hypertension: Secondary | ICD-10-CM | POA: Insufficient documentation

## 2015-12-22 DIAGNOSIS — N3021 Other chronic cystitis with hematuria: Secondary | ICD-10-CM | POA: Diagnosis not present

## 2015-12-22 DIAGNOSIS — F418 Other specified anxiety disorders: Secondary | ICD-10-CM | POA: Diagnosis not present

## 2015-12-22 DIAGNOSIS — K219 Gastro-esophageal reflux disease without esophagitis: Secondary | ICD-10-CM | POA: Insufficient documentation

## 2015-12-22 DIAGNOSIS — E119 Type 2 diabetes mellitus without complications: Secondary | ICD-10-CM | POA: Insufficient documentation

## 2015-12-22 DIAGNOSIS — D494 Neoplasm of unspecified behavior of bladder: Secondary | ICD-10-CM | POA: Diagnosis not present

## 2015-12-22 DIAGNOSIS — R3129 Other microscopic hematuria: Secondary | ICD-10-CM | POA: Diagnosis present

## 2015-12-22 DIAGNOSIS — R311 Benign essential microscopic hematuria: Secondary | ICD-10-CM | POA: Diagnosis not present

## 2015-12-22 DIAGNOSIS — N329 Bladder disorder, unspecified: Secondary | ICD-10-CM

## 2015-12-22 HISTORY — PX: CYSTOSCOPY WITH BIOPSY: SHX5122

## 2015-12-22 LAB — GLUCOSE, CAPILLARY: GLUCOSE-CAPILLARY: 123 mg/dL — AB (ref 65–99)

## 2015-12-22 SURGERY — CYSTOSCOPY, WITH BIOPSY
Anesthesia: General | Wound class: Clean Contaminated

## 2015-12-22 MED ORDER — OXYCODONE HCL 5 MG/5ML PO SOLN: 5.0000 mg | Freq: Once | ORAL | Status: DC | PRN

## 2015-12-22 MED ORDER — HYDROCODONE-ACETAMINOPHEN 5-325 MG PO TABS: 1.0000 | ORAL_TABLET | Freq: Four times a day (QID) | ORAL | Status: DC | PRN

## 2015-12-22 MED ORDER — CEFAZOLIN SODIUM-DEXTROSE 2-3 GM-% IV SOLR
2.0000 g | INTRAVENOUS | Status: AC
  Administered 2015-12-22: 2 g via INTRAVENOUS

## 2015-12-22 MED ORDER — OXYCODONE HCL 5 MG PO TABS: 5.0000 mg | ORAL_TABLET | Freq: Once | ORAL | Status: DC | PRN

## 2015-12-22 MED ORDER — OXYBUTYNIN CHLORIDE 5 MG PO TABS: 5.0000 mg | ORAL_TABLET | Freq: Three times a day (TID) | ORAL | Status: DC | PRN

## 2015-12-22 MED ORDER — CEFAZOLIN SODIUM-DEXTROSE 2-3 GM-% IV SOLR
INTRAVENOUS | Status: AC
  Administered 2015-12-22: 2 g via INTRAVENOUS
  Filled 2015-12-22: qty 50

## 2015-12-22 MED ORDER — LIDOCAINE HCL (CARDIAC) 20 MG/ML IV SOLN
INTRAVENOUS | Status: DC | PRN
  Administered 2015-12-22: 100 mg via INTRAVENOUS

## 2015-12-22 MED ORDER — FAMOTIDINE 20 MG PO TABS
20.0000 mg | ORAL_TABLET | Freq: Once | ORAL | Status: AC
Start: 2015-12-22 — End: 2015-12-22
  Administered 2015-12-22: 20 mg via ORAL

## 2015-12-22 MED ORDER — FENTANYL CITRATE (PF) 100 MCG/2ML IJ SOLN
INTRAMUSCULAR | Status: DC | PRN
  Administered 2015-12-22 (×2): 25 ug via INTRAVENOUS

## 2015-12-22 MED ORDER — FAMOTIDINE 20 MG PO TABS
ORAL_TABLET | ORAL | Status: AC
Start: 2015-12-22 — End: 2015-12-22
  Administered 2015-12-22: 20 mg via ORAL
  Filled 2015-12-22: qty 1

## 2015-12-22 MED ORDER — ONDANSETRON HCL 4 MG/2ML IJ SOLN
4.0000 mg | Freq: Once | INTRAMUSCULAR | Status: AC
  Administered 2015-12-22: 4 mg via INTRAVENOUS

## 2015-12-22 MED ORDER — FENTANYL CITRATE (PF) 100 MCG/2ML IJ SOLN
INTRAMUSCULAR | Status: AC
  Filled 2015-12-22: qty 2

## 2015-12-22 MED ORDER — HYDROCODONE-ACETAMINOPHEN 5-325 MG PO TABS
ORAL_TABLET | ORAL | Status: AC
  Administered 2015-12-22: 1
  Filled 2015-12-22: qty 1

## 2015-12-22 MED ORDER — DEXAMETHASONE SODIUM PHOSPHATE 10 MG/ML IJ SOLN
INTRAMUSCULAR | Status: DC | PRN
  Administered 2015-12-22: 4 mg via INTRAVENOUS

## 2015-12-22 MED ORDER — ONDANSETRON HCL 4 MG/2ML IJ SOLN
INTRAMUSCULAR | Status: AC
  Administered 2015-12-22: 4 mg
  Filled 2015-12-22: qty 2

## 2015-12-22 MED ORDER — SODIUM CHLORIDE 0.9 % IV SOLN
INTRAVENOUS | Status: DC
  Administered 2015-12-22 (×2): via INTRAVENOUS

## 2015-12-22 MED ORDER — PROPOFOL 10 MG/ML IV BOLUS
INTRAVENOUS | Status: DC | PRN
  Administered 2015-12-22: 150 mg via INTRAVENOUS

## 2015-12-22 MED ORDER — ONDANSETRON HCL 4 MG/2ML IJ SOLN
INTRAMUSCULAR | Status: DC | PRN
  Administered 2015-12-22: 4 mg via INTRAVENOUS

## 2015-12-22 MED ORDER — FENTANYL CITRATE (PF) 100 MCG/2ML IJ SOLN
25.0000 ug | INTRAMUSCULAR | Status: DC | PRN
  Administered 2015-12-22 (×2): 25 ug via INTRAVENOUS
  Administered 2015-12-22: 50 ug via INTRAVENOUS

## 2015-12-22 SURGICAL SUPPLY — 15 items
BAG DRAIN CYSTO-URO LG1000N (MISCELLANEOUS) ×2 IMPLANT
CORD URO TURP 10FT (MISCELLANEOUS) ×2 IMPLANT
ELECT REM PT RETURN 9FT ADLT (ELECTROSURGICAL) ×2
ELECTRODE REM PT RTRN 9FT ADLT (ELECTROSURGICAL) ×1 IMPLANT
GLOVE BIO SURGEON STRL SZ 6.5 (GLOVE) ×2 IMPLANT
GLOVE BIO SURGEON STRL SZ7 (GLOVE) ×4 IMPLANT
GOWN STRL REUS W/ TWL LRG LVL3 (GOWN DISPOSABLE) ×2 IMPLANT
GOWN STRL REUS W/TWL LRG LVL3 (GOWN DISPOSABLE) ×4
KIT RM TURNOVER CYSTO AR (KITS) ×2 IMPLANT
PACK CYSTO AR (MISCELLANEOUS) ×2 IMPLANT
PREP PVP WINGED SPONGE (MISCELLANEOUS) ×2 IMPLANT
SET CYSTO W/LG BORE CLAMP LF (SET/KITS/TRAYS/PACK) ×2 IMPLANT
SURGILUBE 2OZ TUBE FLIPTOP (MISCELLANEOUS) ×2 IMPLANT
WATER STERILE IRR 1000ML POUR (IV SOLUTION) ×2 IMPLANT
WATER STERILE IRR 3000ML UROMA (IV SOLUTION) ×2 IMPLANT

## 2015-12-22 NOTE — Op Note (Signed)
Date of procedure: 12/22/2015  Preoperative diagnosis:  1. Microscopic hematuria 2. Bladder lesion/erythema  Postoperative diagnosis:  1. Same as above   Procedure: 1. Cystoscopy 2. Bladder biopsy  Surgeon: [name], MD  Anesthesia: General  Complications: None  Intraoperative findings: patchy velvety erythema involving the dome, posterior bladder wall along with right bladder wall and just beyond the left hemitrigone. Highly concerning for CIS.  EBL: normal  Specimens: targeted bladder biopsy 5  Drains: none  Indication: Virginia Weaver is a 65 y.o. patient with scopic hematuria who underwent routine cystoscopy as part of her workup. In clinic, she was found have significant patchy erythema concerning for possible CIS. She was counseled to undergo bladder biopsy in the OR.Marland Kitchen  After reviewing the management options for treatment, she elected to proceed with the above surgical procedure(s). We have discussed the potential benefits and risks of the procedure, side effects of the proposed treatment, the likelihood of the patient achieving the goals of the procedure, and any potential problems that might occur during the procedure or recuperation. Informed consent has been obtained.  Description of procedure:  The patient was taken to the operating room and general anesthesia was induced.  The patient was placed in the dorsal lithotomy position, prepped and draped in the usual sterile fashion, and preoperative antibiotics were administered. A preoperative time-out was performed.   A 22 French rigid cystoscope was advanced per urethra into the bladder. Careful inspection of the bladder revealed several areas of patchy erythema with a somewhat velvety appearance involving the posterior wall, right lateral wall, dome, and a small patch just be on the left UO. There is no papillary tumors identified within the bladder. The trigone was normal with clear reflux from both UOs. Each UO  was in orthotopic position. There were no other lesions arm for bodies within the bladder. At this point time, cold cup biopsy forceps were used to biopsy approximate 5 areas in the bladder as described above which appeared suspicious. Once the biopsies were complete and passed off altogether, but the electrocautery was used to fulgurate the bases of each of the lesions for hemostasis. Once adequate hemostasis was achieved, the scope was removed. The patient was then repositioned in the supine position after her bladder was drained, she was reversed from anesthesia and taken to the PACU in stable condition.  Plan: Patient will follow-up in 1 week to pathology results. If these are negative, we'll call her sooner with those results.  [name], M.D.

## 2015-12-22 NOTE — Anesthesia Procedure Notes (Signed)
Procedure Name: LMA Insertion Date/Time: 12/22/2015 1:08 PM Performed by: Alda Berthold Pre-anesthesia Checklist: Patient identified, Patient being monitored, Timeout performed, Emergency Drugs available and Suction available Patient Re-evaluated:Patient Re-evaluated prior to inductionOxygen Delivery Method: Circle system utilized Preoxygenation: Pre-oxygenation with 100% oxygen Intubation Type: IV induction Ventilation: Mask ventilation without difficulty LMA: LMA inserted LMA Size: 4.0 Tube type: Oral Number of attempts: 1 Placement Confirmation: positive ETCO2 and breath sounds checked- equal and bilateral Tube secured with: Tape Dental Injury: Teeth and Oropharynx as per pre-operative assessment

## 2015-12-22 NOTE — Transfer of Care (Signed)
Immediate Anesthesia Transfer of Care Note  Patient: Virginia Weaver  Procedure(s) Performed: Procedure(s): CYSTOSCOPY WITH BIOPSY (N/A)  Patient Location: PACU  Anesthesia Type:General  Level of Consciousness: awake, alert , oriented and patient cooperative  Airway & Oxygen Therapy: Patient Spontanous Breathing and Patient connected to nasal cannula oxygen  Post-op Assessment: Report given to RN, Post -op Vital signs reviewed and stable and Patient moving all extremities  Post vital signs: Reviewed and stable  Last Vitals:  Filed Vitals:   12/22/15 1105 12/22/15 1343  BP: 138/63 167/84  Pulse: 66 69  Temp: 36.7 C 36.4 C  Resp: 18 12    Complications: No apparent anesthesia complications

## 2015-12-22 NOTE — Anesthesia Preprocedure Evaluation (Signed)
Anesthesia Evaluation  Patient identified by MRN, date of birth, ID band Patient awake    Reviewed: Allergy & Precautions, H&P , NPO status , Patient's Chart, lab work & pertinent test results  History of Anesthesia Complications Negative for: history of anesthetic complications  Airway Mallampati: III  TM Distance: >3 FB Neck ROM: full    Dental  (+) Poor Dentition   Pulmonary neg pulmonary ROS, neg shortness of breath,    Pulmonary exam normal breath sounds clear to auscultation       Cardiovascular Exercise Tolerance: Good hypertension, (-) angina(-) Past MI and (-) DOE Normal cardiovascular exam+ Valvular Problems/Murmurs  Rhythm:regular Rate:Normal     Neuro/Psych PSYCHIATRIC DISORDERS Anxiety Depression negative neurological ROS     GI/Hepatic Neg liver ROS, GERD  Controlled,  Endo/Other  negative endocrine ROSdiabetes, Well Controlled, Type 2  Renal/GU negative Renal ROS  negative genitourinary   Musculoskeletal negative musculoskeletal ROS (+)   Abdominal   Peds negative pediatric ROS (+)  Hematology negative hematology ROS (+)   Anesthesia Other Findings Past Medical History:   Hypertension                                                 Diabetes mellitus without complication (HCC)                   Comment:diet    Benign essential HTN                            11/11/1993    Syncope and collapse                            01/20/2009    Urticaria                                       06/04/2015    Adiposity                                       04/20/1993    Acid reflux                                     04/02/2001    Heart murmur                                                 Tinnitus                                                       Comment:left ear  Past Surgical History:   TUBAL LIGATION  1987         TUMMY TUCK                                        06/17/2010  BMI    Body Mass Index   32.76 kg/m 2      Reproductive/Obstetrics negative OB ROS                             Anesthesia Physical Anesthesia Plan  ASA: III  Anesthesia Plan: General LMA   Post-op Pain Management:    Induction:   Airway Management Planned:   Additional Equipment:   Intra-op Plan:   Post-operative Plan:   Informed Consent: I have reviewed the patients History and Physical, chart, labs and discussed the procedure including the risks, benefits and alternatives for the proposed anesthesia with the patient or authorized representative who has indicated his/her understanding and acceptance.   Dental Advisory Given  Plan Discussed with: Anesthesiologist, CRNA and Surgeon  Anesthesia Plan Comments:         Anesthesia Quick Evaluation

## 2015-12-22 NOTE — Interval H&P Note (Signed)
History and Physical Interval Note:  12/22/2015 12:31 PM  Virginia Weaver  has presented today for surgery, with the diagnosis of BLADDER LESION  The various methods of treatment have been discussed with the patient and family. After consideration of risks, benefits and other options for treatment, the patient has consented to  Procedure(s): CYSTOSCOPY WITH BIOPSY (N/A) as a surgical intervention .  The patient's history has been reviewed, patient examined, no change in status, stable for surgery.  I have reviewed the patient's chart and labs.  Questions were answered to the patient's satisfaction.    RRR CTAB   [name]

## 2015-12-22 NOTE — Discharge Instructions (Signed)
Transurethral Resection of Bladder Tumor (TURBT) or Bladder Biopsy   Definition:  Transurethral Resection of the Bladder Tumor is a surgical procedure used to diagnose and remove tumors within the bladder. TURBT is the most common treatment for early stage bladder cancer.  General instructions:     Your recent bladder surgery requires very little post hospital care but some definite precautions.  Despite the fact that no skin incisions were used, the area around the bladder incisions are raw and covered with scabs to promote healing and prevent bleeding. Certain precautions are needed to insure that the scabs are not disturbed over the next 2-4 weeks while the healing proceeds.  Because the raw surface inside your bladder and the irritating effects of urine you may expect frequency of urination and/or urgency (a stronger desire to urinate) and perhaps even getting up at night more often. This will usually resolve or improve slowly over the healing period. You may see some blood in your urine over the first 6 weeks. Do not be alarmed, even if the urine was clear for a while. Get off your feet and drink lots of fluids until clearing occurs. If you start to pass clots or don't improve call us.  Diet:  You may return to your normal diet immediately. Because of the raw surface of your bladder, alcohol, spicy foods, foods high in acid and drinks with caffeine may cause irritation or frequency and should be used in moderation. To keep your urine flowing freely and avoid constipation, drink plenty of fluids during the day (8-10 glasses). Tip: Avoid cranberry juice because it is very acidic.  Activity:  Your physical activity doesn't need to be restricted. However, if you are very active, you may see some blood in the urine. We suggest that you reduce your activity under the circumstances until the bleeding has stopped.  Bowels:  It is important to keep your bowels regular during the postoperative  period. Straining with bowel movements can cause bleeding. A bowel movement every other day is reasonable. Use a mild laxative if needed, such as milk of magnesia 2-3 tablespoons, or 2 Dulcolax tablets. Call if you continue to have problems. If you had been taking narcotics for pain, before, during or after your surgery, you may be constipated. Take a laxative if necessary.    Medication:  You should resume your pre-surgery medications unless told not to. In addition you may be given an antibiotic to prevent or treat infection. Antibiotics are not always necessary. All medication should be taken as prescribed until the bottles are finished unless you are having an unusual reaction to one of the drugs.   [facility] [address], [place], [place] [address] [phone]    AMBULATORY SURGERY  DISCHARGE INSTRUCTIONS   1) The drugs that you were given will stay in your system until tomorrow so for the next 24 hours you should not:  A) Drive an automobile B) Make any legal decisions C) Drink any alcoholic beverage   2) You may resume regular meals tomorrow.  Today it is better to start with liquids and gradually work up to solid foods.  You may eat anything you prefer, but it is better to start with liquids, then soup and crackers, and gradually work up to solid foods.   3) Please notify your doctor immediately if you have any unusual bleeding, trouble breathing, redness and pain at the surgery site, drainage, fever, or pain not relieved by medication.   Please  contact your physician with any problems or Same Day Surgery at [phone], Monday through Friday 6 am to 4 pm, or [place] at [facility] number at [phone].

## 2015-12-22 NOTE — H&P (View-Only) (Signed)
Patient returns in continuing management of microscopic hematuria and irritative voiding symptoms. She was started on transvaginal estrogen cream.  She underwent CT hematuria protocol February 2017 with delayed imaging which was normal. I reviewed the images.  UA today shows 3-10 red blood cells per high-powered field.  Physical exam: Mare Ferrari was chaperone. Abdomen-soft nontender nondistended. No palpable masses. Lower midline scar from prior plastic surgery well healed.  GU-vaginal atrophy. No vaginal or vulvar masses. No prolapse. Bladder and urethra were palpably normal.   Cystoscopy: The patient was prepped and draped in the usual sterile fashion. Findings: The urethra appeared normal. The trigone and ureteral orifices were in their normal orthotopic position. There was clear efflux. Was no stone or foreign body in the bladder. There were no papillary tumors there were some bright areas of erythematous patches at the dome and one left posterior.  Assessment/plan: Microscopic hematuria-areas of bladder erythema and patches. Needs biopsy and fulguration. Urine cytology was also sent. I discussed with the patient the nature risks benefits and alternatives to cystoscopy with bladder biopsy and fulguration. All questions answered and she elects to proceed.  Dysuria, frequency-continue to monitor.  Pt has not started TV estrogen.

## 2015-12-23 LAB — SURGICAL PATHOLOGY

## 2015-12-23 NOTE — Anesthesia Postprocedure Evaluation (Signed)
Anesthesia Post Note  Patient: Virginia Weaver  Procedure(s) Performed: Procedure(s) (LRB): CYSTOSCOPY WITH BIOPSY (N/A)  Patient location during evaluation: PACU Anesthesia Type: General Level of consciousness: awake and alert Pain management: pain level controlled Vital Signs Assessment: post-procedure vital signs reviewed and stable Respiratory status: spontaneous breathing, nonlabored ventilation, respiratory function stable and patient connected to nasal cannula oxygen Cardiovascular status: blood pressure returned to baseline and stable Postop Assessment: no signs of nausea or vomiting Anesthetic complications: no    Last Vitals:  Filed Vitals:   12/22/15 1445 12/22/15 1520  BP: 139/50 156/62  Pulse: 55 60  Temp:    Resp: 14 15    Last Pain:  Filed Vitals:   12/22/15 1522  PainSc: 2                  Molli Barrows

## 2015-12-24 ENCOUNTER — Telehealth: Payer: Self-pay | Admitting: Urology

## 2015-12-24 NOTE — Telephone Encounter (Signed)
Called patient to review pathology results. Biopsies consistent with chronic cystitis. No evidence of malignancy.   Given the appearance of her bladder and relatively high suspicion, recommend follow-up in 3 months with cystoscopy and urine cytology.  Patient is otherwise doing well postop. No complaints today.  [name], MD

## 2015-12-24 NOTE — Telephone Encounter (Signed)
I cx her path results appt for the 7th and made her a cysto appt for 03-25-16 with you.  Thanks,  Sharyn Lull

## 2015-12-29 ENCOUNTER — Ambulatory Visit: Payer: 59 | Admitting: Urology

## 2016-01-13 ENCOUNTER — Other Ambulatory Visit: Payer: Self-pay | Admitting: Urology

## 2016-02-17 ENCOUNTER — Other Ambulatory Visit: Payer: Self-pay | Admitting: Family Medicine

## 2016-02-17 DIAGNOSIS — E7800 Pure hypercholesterolemia, unspecified: Secondary | ICD-10-CM

## 2016-02-17 DIAGNOSIS — I10 Essential (primary) hypertension: Secondary | ICD-10-CM

## 2016-02-18 ENCOUNTER — Other Ambulatory Visit: Payer: Self-pay | Admitting: Family Medicine

## 2016-02-18 DIAGNOSIS — J309 Allergic rhinitis, unspecified: Secondary | ICD-10-CM

## 2016-03-05 ENCOUNTER — Other Ambulatory Visit: Payer: Self-pay | Admitting: Family Medicine

## 2016-03-17 ENCOUNTER — Encounter: Payer: Self-pay | Admitting: Physician Assistant

## 2016-03-17 ENCOUNTER — Ambulatory Visit (INDEPENDENT_AMBULATORY_CARE_PROVIDER_SITE_OTHER): Payer: 59 | Admitting: Physician Assistant

## 2016-03-17 VITALS — BP 122/68 | HR 60 | Temp 97.0°F | Resp 16 | Ht 63.0 in | Wt 189.8 lb

## 2016-03-17 DIAGNOSIS — E559 Vitamin D deficiency, unspecified: Secondary | ICD-10-CM | POA: Diagnosis not present

## 2016-03-17 DIAGNOSIS — Z8249 Family history of ischemic heart disease and other diseases of the circulatory system: Secondary | ICD-10-CM | POA: Diagnosis not present

## 2016-03-17 DIAGNOSIS — N952 Postmenopausal atrophic vaginitis: Secondary | ICD-10-CM

## 2016-03-17 DIAGNOSIS — Z1239 Encounter for other screening for malignant neoplasm of breast: Secondary | ICD-10-CM

## 2016-03-17 DIAGNOSIS — Z0000 Encounter for general adult medical examination without abnormal findings: Secondary | ICD-10-CM | POA: Diagnosis not present

## 2016-03-17 DIAGNOSIS — E538 Deficiency of other specified B group vitamins: Secondary | ICD-10-CM | POA: Diagnosis not present

## 2016-03-17 DIAGNOSIS — E7800 Pure hypercholesterolemia, unspecified: Secondary | ICD-10-CM | POA: Diagnosis not present

## 2016-03-17 MED ORDER — ESTRADIOL 0.1 MG/GM VA CREA: 1.0000 | TOPICAL_CREAM | Freq: Every day | VAGINAL | Status: DC

## 2016-03-17 NOTE — Patient Instructions (Signed)

## 2016-03-17 NOTE — Progress Notes (Signed)
Patient: Virginia Weaver, Female    DOB: [birth year], 65 y.o.   MRN: [id number] Visit Date: 03/17/2016  Today's Provider: [name], PA-C   Chief Complaint  Patient presents with  . Annual Exam   Subjective:    Annual physical exam Virginia Weaver is a 65 y.o. female who presents today for health maintenance and complete physical. She feels well. She reports exercising -she enroll in boot camp again. She reports she is sleeping poorly.She reports that in order to get 5 hours of sleep she needs to take 2 Melatonin, Xanax and Benadryl.She reports that the majority of her sleep issues are secondary to stress as she has to be on call 24-7 for her job. She is considering a change in job to reduce her stress level.    Of note she does have a history of having positive stool occult test.  She has underwent a colonoscopy in 2010 and then also underwent  a endoscopy and colonoscopy with a PillCam in September 2011.  She was found to have gastric erosion secondary to nonsteroidal  anti-inflammatory therapies. Being that she had completely normal  colonoscopy and PillCam endoscopy/colonoscopy she has not had  any stool occult test as we know they will be positive.   She also has had urinary issues recently and was referred to  [facility] urological with Dr. [name]. She did undergo a  cystoscopy and was found to have a mass in her bladder. Biopsies  were negative. She returns next month for follow-up.   She also was referred to Dr. Nehemiah Massed, cardiology, for chest pain and  very strong family history of cardiovascular disease. She recently  underwent an exercise stress test and reports it was normal. She will  continue to be followed by Dr. Nehemiah Massed.   She does have a complaint today of vaginal itching and dryness. She  states that her Erlene Quan had told her that she had atrophic vaginitis.  She was to prescribe her medication but she did not do that. She has  been using Vagisil  for the itch. That helps some but has not helped  Completely.  Colonoscopy:02/17/2009 Normal Pap: 03/18/15 Normal -----------------------------------------------------------------   Review of Systems  Constitutional: Positive for fatigue.  HENT: Positive for congestion, hearing loss, sinus pressure and tinnitus.   Eyes: Positive for photophobia and redness.  Respiratory: Negative.   Cardiovascular: Positive for chest pain and leg swelling.  Gastrointestinal: Positive for blood in stool.  Endocrine: Negative.   Genitourinary: Positive for dysuria.  Musculoskeletal: Positive for gait problem.  Skin: Negative.   Allergic/Immunologic: Positive for environmental allergies.  Neurological: Negative.   Hematological: Bruises/bleeds easily.  Psychiatric/Behavioral: Negative.     Social History      She  reports that she has never smoked. She has never used smokeless tobacco. She reports that she does not drink alcohol or use illicit drugs.       Social History   Social History  . Marital Status: Widowed    Spouse Name: N/A  . Number of Children: 2  . Years of Education: H/S   Occupational History  . [employer] [place] History Main Topics  . Smoking status: Never Smoker   . Smokeless tobacco: Never Used  . Alcohol Use: No  . Drug Use: No  . Sexual Activity: Not Asked   Other Topics Concern  . None   Social History Narrative  Past Medical History  Diagnosis Date  . Hypertension   . Diabetes mellitus without complication (HCC)     diet   . Benign essential HTN 11/11/1993  . Syncope and collapse 01/20/2009  . Urticaria 06/04/2015  . Adiposity 04/20/1993  . Acid reflux 04/02/2001  . Heart murmur   . Tinnitus     left ear     Patient Active Problem List   Diagnosis Date Noted  . Allergic rhinitis 02/18/2016  . Urticaria 06/04/2015  . Anxiety 04/29/2015  . Depression, major, single episode, moderate ([place]) 04/29/2015  . Bilateral  tinnitus 04/29/2015  . Avitaminosis D 04/29/2015  . Hypertension 03/30/2015  . Hypercholesteremia 03/30/2015  . Cardiovascular symptoms 01/12/2010  . Syncope and collapse 01/20/2009  . Fam hx-ischem heart disease 11/25/2008  . Acid reflux 04/02/2001  . Benign essential HTN 11/11/1993  . Adiposity 04/20/1993    Past Surgical History  Procedure Laterality Date  . Tubal ligation  1987  . Tummy tuck  06/17/2010  . Cystoscopy with biopsy N/A 12/22/2015    Procedure: CYSTOSCOPY WITH BIOPSY;  Surgeon: [name], MD;  Location: [facility] ORS;  Service: Urology;  Laterality: N/A;    Family History        Family Status  Relation Status Death Age  . Mother Deceased 62    12-Jul-2013  . Father Deceased     Recently past away 03/2015 secondary to MVA.   Marland Kitchen Sister Alive   . Maternal Grandmother Deceased   . Maternal Grandfather Deceased   . Paternal Grandmother Deceased   . Paternal Grandfather Deceased   . Daughter Alive   . Son Alive   . Sister Alive         Her family history includes AAA (abdominal aortic aneurysm) in her father; CVA in her paternal grandmother; Congestive Heart Failure in her paternal grandmother; Diabetes in her mother; Glaucoma in her father; Heart attack in her father, maternal grandfather, mother, and paternal grandfather; Hypertension in her father; Obesity in her sister; Stroke in her mother; Throat cancer in her maternal grandmother and mother.    Allergies  Allergen Reactions  . Ibuprofen     Other reaction(s): Abdominal Pain Can't take NSAIDS secondary to erosions due to NSAIDS.  Marland Kitchen Nsaids     Can't take NSAIDS secondary to erosions due to NSAIDS.    Previous Medications   ALPRAZOLAM (XANAX) 0.5 MG TABLET    TAKE 1 TABLET BY MOUTH EVERY 8 HOURS AS NEEDED   CALCIUM CARBONATE (OS-CAL - DOSED IN MG OF ELEMENTAL CALCIUM) 1250 (500 CA) MG TABLET    Take 1 tablet by mouth daily with breakfast.   CHOLECALCIFEROL 1000 UNITS CAPSULE    Take 500 Units by mouth daily.     CINNAMON 500 MG CAPSULE    Take 500 mg by mouth daily.   COLLAGEN PO    Take 1 tablet by mouth.   CYANOCOBALAMIN 1000 MCG TABLET    Take 1 tablet by mouth daily.   DIPHENHYDRAMINE (BENADRYL) 25 MG TABLET    Take 25 mg by mouth every 6 (six) hours as needed.   FLUTICASONE (FLONASE) 50 MCG/ACT NASAL SPRAY       GLUTAMINE 500 MG CAPS    Take 500 mg by mouth daily. Reported on 03/17/2016   HYDROCODONE-ACETAMINOPHEN (NORCO/VICODIN) 5-325 MG TABLET    Take 1-2 tablets by mouth every 6 (six) hours as needed for moderate pain.   HYDROXYZINE (ATARAX/VISTARIL) 25 MG TABLET    TAKE 1 TABLET (  25 MG TOTAL) BY MOUTH EVERY 8 (EIGHT) HOURS AS NEEDED (FOR HIVES/ITCHING).   LACTOBACILLUS (PROBIOTIC ACIDOPHILUS PO)    Take 1 capsule by mouth daily.   LEVOCETIRIZINE (XYZAL) 5 MG TABLET    TAKE 1 TABLET EVERY DAY   MELATONIN 10 MG CAPS    Take 10 mg by mouth at bedtime.   MONTELUKAST (SINGULAIR) 10 MG TABLET       MULTIPLE VITAMINS-MINERALS (OCUVITE ADULT 50+) CAPS    Take 1 capsule by mouth daily. Reported on 03/17/2016   OXYBUTYNIN (DITROPAN) 5 MG TABLET    Take 1 tablet (5 mg total) by mouth every 8 (eight) hours as needed for bladder spasms.   PRAVASTATIN (PRAVACHOL) 20 MG TABLET    TAKE 1 TABLET BY MOUTH AT BEDTIME   RAMIPRIL (ALTACE) 2.5 MG CAPSULE    TAKE 1 CAPSULE BY MOUTH EVERY DAY    Patient Care Team: Margarita Rana, MD as PCP - General (Family Medicine)     Objective:   Vitals: BP 122/68 mmHg  Pulse 60  Temp(Src) 97 F (36.1 C) (Oral)  Resp 16  Ht 5\' 3"  (1.6 m)  Wt 189 lb 12.8 oz (86.093 kg)  BMI 33.63 kg/m2   Physical Exam  Constitutional: She is oriented to person, place, and time. She appears well-developed and well-nourished. No distress.  HENT:  Head: Normocephalic and atraumatic.  Right Ear: External ear normal.  Left Ear: External ear normal.  Nose: Nose normal.  Mouth/Throat: Oropharynx is clear and moist. No oropharyngeal exudate.  Eyes: Conjunctivae and EOM are normal.  Pupils are equal, round, and reactive to light. Right eye exhibits no discharge. Left eye exhibits no discharge. No scleral icterus.  Neck: Normal range of motion. Neck supple. No JVD present. No tracheal deviation present. No thyromegaly present.  Cardiovascular: Normal rate, regular rhythm, normal heart sounds and intact distal pulses.  Exam reveals no gallop and no friction rub.   No murmur heard. Pulmonary/Chest: Effort normal and breath sounds normal. No respiratory distress. She has no wheezes. She has no rales. She exhibits no tenderness. Right breast exhibits no inverted nipple, no mass, no nipple discharge, no skin change and no tenderness. Left breast exhibits no inverted nipple, no mass, no nipple discharge, no skin change and no tenderness. Breasts are symmetrical.  Abdominal: Soft. Bowel sounds are normal. She exhibits no distension and no mass. There is no tenderness. There is no rebound and no guarding.  Genitourinary:  Patient deferred as pap was not needed  Musculoskeletal: Normal range of motion. She exhibits no edema or tenderness.  Lymphadenopathy:    She has no cervical adenopathy.  Neurological: She is alert and oriented to person, place, and time.  Skin: Skin is warm and dry. No rash noted. She is not diaphoretic.  Psychiatric: She has a normal mood and affect. Her behavior is normal. Judgment and thought content normal.  Vitals reviewed.    Depression Screen No flowsheet data found.    Assessment & Plan:     Routine Health Maintenance and Physical Exam  Exercise Activities and Dietary recommendations Goals    None      Immunization History  Administered Date(s) Administered  . Td 10/21/2004  . Tdap 01/12/2010  . Zoster 10/09/2012    Health Maintenance  Topic Date Due  . Hepatitis C Screening  [birth year]  . PNEUMOCOCCAL POLYSACCHARIDE VACCINE (1) 08/16/1955  . FOOT EXAM  08/16/1963  . OPHTHALMOLOGY EXAM  08/16/1963  . HIV Screening  08/15/1968  .  PAP SMEAR  08/15/1974  . MAMMOGRAM  08/16/2003  . COLONOSCOPY  08/16/2003  . HEMOGLOBIN A1C  05/09/2016  . INFLUENZA VACCINE  05/24/2016  . TETANUS/TDAP  01/13/2020  . ZOSTAVAX  Completed      Discussed health benefits of physical activity, and encouraged her to engage in regular exercise appropriate for her age and condition.   1. Annual physical exam Normal physical exam today. Will check labs as below and f/u pending lab results. If labs are stable and WNL she will not need to have these rechecked for one year at her next annual physical exam. She is to call the office in the meantime if she has any acute issue, questions or concerns. - CBC with Differential/Platelet - Comprehensive metabolic panel - Hemoglobin A1c - TSH  2. Breast cancer screening Breast exam today was normal. There is no family history of breast cancer. She does perform regular self breast exams. Mammogram was ordered as below. Information for [facility] was given to patient so she may schedule her mammogram at her convenience. - MM DIGITAL SCREENING BILATERAL; Future  3. Hypercholesteremia History of this with strong family history of cardiovascular disease. I will check cholesterol labs as below and follow-up pending results. - Lipid panel  4. Fam hx-ischem heart disease See above medical treatment plan. - Lipid panel  5. Avitaminosis D History of this and currently taking a vitamin D over-the-counter supplement. I will check labs as below and follow-up pending results. - Vitamin D (25 hydroxy)  6. B12 deficiency History of this and currently taking a B12 over-the-counter supplement. I will check labs as below and follow-up pending results. - B12  7. Postmenopausal atrophic vaginitis Was told by Dr. Erlene Quan, urology, that she had atrophic vaginitis causing the dryness and itching symptoms she is having. She was going to prescribe her oral Estrace but the patient did not want to take this due  to side effects. She stated she will call her in something else but she never did. Patient is asking for treatment today. I will send in Estrace vaginal cream as below. Advised her to use nightly until symptoms subside then go to every other day use. Patient agrees. She is to call the office if she has any adverse reactions, questions or concerns. - estradiol (ESTRACE) 0.1 MG/GM vaginal cream; Place 1 Applicatorful vaginally at bedtime.  Dispense: 42.5 g; Refill: 12  --------------------------------------------------------------------

## 2016-03-18 ENCOUNTER — Telehealth: Payer: Self-pay

## 2016-03-18 LAB — COMPREHENSIVE METABOLIC PANEL
A/G RATIO: 2.3 — AB (ref 1.2–2.2)
ALBUMIN: 4.4 g/dL (ref 3.6–4.8)
ALT: 17 IU/L (ref 0–32)
AST: 25 IU/L (ref 0–40)
Alkaline Phosphatase: 65 IU/L (ref 39–117)
BUN/Creatinine Ratio: 26 (ref 12–28)
BUN: 19 mg/dL (ref 8–27)
Bilirubin Total: 0.8 mg/dL (ref 0.0–1.2)
CALCIUM: 9.6 mg/dL (ref 8.7–10.3)
CO2: 26 mmol/L (ref 18–29)
CREATININE: 0.74 mg/dL (ref 0.57–1.00)
Chloride: 101 mmol/L (ref 96–106)
GFR calc non Af Amer: 87 mL/min/{1.73_m2} (ref >59)
GFR, EST AFRICAN AMERICAN: 100 mL/min/{1.73_m2} (ref >59)
GLOBULIN, TOTAL: 1.9 g/dL (ref 1.5–4.5)
Glucose: 111 mg/dL — ABNORMAL HIGH (ref 65–99)
POTASSIUM: 4.5 mmol/L (ref 3.5–5.2)
SODIUM: 143 mmol/L (ref 134–144)
TOTAL PROTEIN: 6.3 g/dL (ref 6.0–8.5)

## 2016-03-18 LAB — CBC WITH DIFFERENTIAL/PLATELET
BASOS: 1 %
Basophils Absolute: 0 10*3/uL (ref 0.0–0.2)
EOS (ABSOLUTE): 0.1 10*3/uL (ref 0.0–0.4)
EOS: 4 %
HEMATOCRIT: 41.7 % (ref 34.0–46.6)
HEMOGLOBIN: 14.2 g/dL (ref 11.1–15.9)
IMMATURE GRANS (ABS): 0 10*3/uL (ref 0.0–0.1)
IMMATURE GRANULOCYTES: 0 %
LYMPHS: 31 %
Lymphocytes Absolute: 0.9 10*3/uL (ref 0.7–3.1)
MCH: 30.9 pg (ref 26.6–33.0)
MCHC: 34.1 g/dL (ref 31.5–35.7)
MCV: 91 fL (ref 79–97)
MONOCYTES: 10 %
MONOS ABS: 0.3 10*3/uL (ref 0.1–0.9)
NEUTROS PCT: 54 %
Neutrophils Absolute: 1.5 10*3/uL (ref 1.4–7.0)
Platelets: 167 10*3/uL (ref 150–379)
RBC: 4.6 x10E6/uL (ref 3.77–5.28)
RDW: 13.1 % (ref 12.3–15.4)
WBC: 2.8 10*3/uL — ABNORMAL LOW (ref 3.4–10.8)

## 2016-03-18 LAB — VITAMIN B12: VITAMIN B 12: 580 pg/mL (ref 211–946)

## 2016-03-18 LAB — LIPID PANEL
Chol/HDL Ratio: 2.3 ratio units (ref 0.0–4.4)
Cholesterol, Total: 170 mg/dL (ref 100–199)
HDL: 74 mg/dL (ref >39)
LDL CALC: 84 mg/dL (ref 0–99)
Triglycerides: 58 mg/dL (ref 0–149)
VLDL CHOLESTEROL CAL: 12 mg/dL (ref 5–40)

## 2016-03-18 LAB — HEMOGLOBIN A1C
ESTIMATED AVERAGE GLUCOSE: 117 mg/dL
Hgb A1c MFr Bld: 5.7 % — ABNORMAL HIGH (ref 4.8–5.6)

## 2016-03-18 LAB — TSH: TSH: 1.31 u[IU]/mL (ref 0.450–4.500)

## 2016-03-18 LAB — VITAMIN D 25 HYDROXY (VIT D DEFICIENCY, FRACTURES): Vit D, 25-Hydroxy: 43.7 ng/mL (ref 30.0–100.0)

## 2016-03-18 NOTE — Telephone Encounter (Signed)
-----   Message from [name], PA-C sent at 03/18/2016  8:27 AM EDT ----- All labs are within normal limits and stable.  Thanks! -JB

## 2016-03-18 NOTE — Telephone Encounter (Signed)
LMTCB  Thanks,  -Joseline 

## 2016-03-18 NOTE — Telephone Encounter (Signed)
Pt advised-aa 

## 2016-03-20 ENCOUNTER — Other Ambulatory Visit: Payer: Self-pay | Admitting: Family Medicine

## 2016-03-20 DIAGNOSIS — J309 Allergic rhinitis, unspecified: Secondary | ICD-10-CM

## 2016-03-25 ENCOUNTER — Encounter: Payer: Self-pay | Admitting: Urology

## 2016-03-25 ENCOUNTER — Ambulatory Visit (INDEPENDENT_AMBULATORY_CARE_PROVIDER_SITE_OTHER): Payer: 59 | Admitting: Urology

## 2016-03-25 VITALS — BP 126/69 | HR 60 | Ht 63.0 in | Wt 190.3 lb

## 2016-03-25 DIAGNOSIS — N3289 Other specified disorders of bladder: Secondary | ICD-10-CM

## 2016-03-25 DIAGNOSIS — R3129 Other microscopic hematuria: Secondary | ICD-10-CM | POA: Diagnosis not present

## 2016-03-25 DIAGNOSIS — N3020 Other chronic cystitis without hematuria: Secondary | ICD-10-CM | POA: Diagnosis not present

## 2016-03-25 LAB — URINALYSIS, COMPLETE
Bilirubin, UA: NEGATIVE
GLUCOSE, UA: NEGATIVE
Ketones, UA: NEGATIVE
NITRITE UA: NEGATIVE
PH UA: 6 (ref 5.0–7.5)
Protein, UA: NEGATIVE
RBC, UA: NEGATIVE
Specific Gravity, UA: 1.015 (ref 1.005–1.030)
UUROB: 0.2 mg/dL (ref 0.2–1.0)

## 2016-03-25 LAB — MICROSCOPIC EXAMINATION: RBC MICROSCOPIC, UA: NONE SEEN /HPF (ref 0–?)

## 2016-03-25 MED ORDER — CIPROFLOXACIN HCL 500 MG PO TABS
500.0000 mg | ORAL_TABLET | Freq: Once | ORAL | Status: AC
  Administered 2016-03-25: 500 mg via ORAL

## 2016-03-25 MED ORDER — LIDOCAINE HCL 2 % EX GEL
1.0000 "application " | Freq: Once | CUTANEOUS | Status: AC
  Administered 2016-03-25: 1 via URETHRAL

## 2016-03-25 MED ORDER — NITROFURANTOIN MONOHYD MACRO 100 MG PO CAPS: 100.0000 mg | ORAL_CAPSULE | Freq: Every day | ORAL | Status: DC

## 2016-03-27 NOTE — Progress Notes (Addendum)
03/25/2016 1:49 PM   Virginia Weaver [birth year] [id number]  Referring provider: Margarita Rana, MD [address] [place], [place] [address]  Chief Complaint  Patient presents with  . Cysto    HPI: 65 year old female with a history of microscopic hematuria was taken to the operating room on 12/22/2015 for bladder biopsy of some suspicious patchy erythematous areas within the bladder. She does also have a history of dysuria. Biopsy results was consistent with chronic cystitis with lymphoid aggregates and mixed inflammatory infiltrates including eosinophils and plasma cells.  She returns today for repeat cystoscopy to assess for resolution of this abnormal finding within the bladder.  She continues to have intermittent dysuria.  Previous urine cultures have been negative for bacterial infection.  No recent gross hematuria.   PMH: Past Medical History  Diagnosis Date  . Hypertension   . Diabetes mellitus without complication (HCC)     diet   . Benign essential HTN 11/11/1993  . Syncope and collapse 01/20/2009  . Urticaria 06/04/2015  . Adiposity 04/20/1993  . Acid reflux 04/02/2001  . Heart murmur   . Tinnitus     left ear    Surgical History: Past Surgical History  Procedure Laterality Date  . Tubal ligation  1987  . Tummy tuck  06/17/2010  . Cystoscopy with biopsy N/A 12/22/2015    Procedure: CYSTOSCOPY WITH BIOPSY;  Surgeon: [name], MD;  Location: [facility] ORS;  Service: Urology;  Laterality: N/A;    Home Medications:    Medication List       This list is accurate as of: 03/25/16 11:59 PM.  Always use your most recent med list.               ALPRAZolam 0.5 MG tablet  Commonly known as:  XANAX  TAKE 1 TABLET BY MOUTH EVERY 8 HOURS AS NEEDED     calcium carbonate 1250 (500 Ca) MG tablet  Commonly known as:  OS-CAL - dosed in mg of elemental calcium  Take 1 tablet by mouth daily with breakfast.     Cholecalciferol 1000 units capsule  Take 500  Units by mouth daily.     Cinnamon 500 MG capsule  Take 500 mg by mouth daily.     COLLAGEN PO  Take 1 tablet by mouth.     cyanocobalamin 1000 MCG tablet  Take 1 tablet by mouth daily.     diphenhydrAMINE 25 MG tablet  Commonly known as:  BENADRYL  Take 25 mg by mouth every 6 (six) hours as needed.     estradiol 0.1 MG/GM vaginal cream  Commonly known as:  ESTRACE  Place 1 Applicatorful vaginally at bedtime.     hydrOXYzine 25 MG tablet  Commonly known as:  ATARAX/VISTARIL  TAKE 1 TABLET (25 MG TOTAL) BY MOUTH EVERY 8 (EIGHT) HOURS AS NEEDED (FOR HIVES/ITCHING).     levocetirizine 5 MG tablet  Commonly known as:  XYZAL  TAKE 1 TABLET EVERY DAY     Melatonin 10 MG Caps  Take 10 mg by mouth at bedtime.     montelukast 10 MG tablet  Commonly known as:  SINGULAIR  TAKE 1 TABLET BY MOUTH DAILY     nitrofurantoin (macrocrystal-monohydrate) 100 MG capsule  Commonly known as:  MACROBID  Take 1 capsule (100 mg total) by mouth daily.     OCUVITE ADULT 50+ Caps  Take 1 capsule by mouth daily. Reported on 03/17/2016     oxybutynin 5 MG tablet  Commonly  known as:  DITROPAN  Take 1 tablet (5 mg total) by mouth every 8 (eight) hours as needed for bladder spasms.     pravastatin 20 MG tablet  Commonly known as:  PRAVACHOL  TAKE 1 TABLET BY MOUTH AT BEDTIME     PROBIOTIC ACIDOPHILUS PO  Take 1 capsule by mouth daily.     ramipril 2.5 MG capsule  Commonly known as:  ALTACE  TAKE 1 CAPSULE BY MOUTH EVERY DAY        Allergies:  Allergies  Allergen Reactions  . Ibuprofen     Other reaction(s): Abdominal Pain Can't take NSAIDS secondary to erosions due to NSAIDS.  Marland Kitchen Nsaids     Can't take NSAIDS secondary to erosions due to NSAIDS.    Family History: Family History  Problem Relation Age of Onset  . Heart attack Mother   . Stroke Mother   . Diabetes Mother   . Throat cancer Mother   . Heart attack Father   . Hypertension Father   . Glaucoma Father   . AAA  (abdominal aortic aneurysm) Father   . Obesity Sister   . Throat cancer Maternal Grandmother   . Heart attack Maternal Grandfather   . CVA Paternal Grandmother   . Congestive Heart Failure Paternal Grandmother   . Heart attack Paternal Grandfather     Social History:  reports that she has never smoked. She has never used smokeless tobacco. She reports that she does not drink alcohol or use illicit drugs.  Physical Exam: BP 126/69 mmHg  Pulse 60  Ht 5\' 3"  (1.6 m)  Wt 190 lb 4.8 oz (86.32 kg)  BMI 33.72 kg/m2  Constitutional:  Alert and oriented, No acute distress. HEENT: [place] AT, moist mucus membranes.  Trachea midline, no masses. Cardiovascular: No clubbing, cyanosis, or edema. Respiratory: Normal respiratory effort, no increased work of breathing. GI: Abdomen is soft, nontender, nondistended, no abdominal masses. GU: No CVA tenderness.   Normal external genitialia and urethral meatus.   Skin: No rashes, bruises or suspicious lesions. Neurologic: Grossly intact, no focal deficits, moving all 4 extremities. Psychiatric: Normal mood and affect.  Laboratory Data: Lab Results  Component Value Date   WBC 2.8* 03/17/2016   HCT 41.7 03/17/2016   MCV 91 03/17/2016   PLT 167 03/17/2016    Lab Results  Component Value Date   CREATININE 0.74 03/17/2016    Lab Results  Component Value Date   HGBA1C 5.7* 03/17/2016    Urinalysis Results for orders placed or performed in visit on 03/25/16  Microscopic Examination  Result Value Ref Range   WBC, UA >30 (A) 0 -  5 /hpf   RBC, UA None seen 0 -  2 /hpf   Epithelial Cells (non renal) >10 (A) 0 - 10 /hpf   Bacteria, UA Few None seen/Few  Urinalysis, Complete  Result Value Ref Range   Specific Gravity, UA 1.015 1.005 - 1.030   pH, UA 6.0 5.0 - 7.5   Color, UA Yellow Yellow   Appearance Ur Cloudy (A) Clear   Leukocytes, UA 2+ (A) Negative   Protein, UA Negative Negative/Trace   Glucose, UA Negative Negative   Ketones, UA  Negative Negative   RBC, UA Negative Negative   Bilirubin, UA Negative Negative   Urobilinogen, Ur 0.2 0.2 - 1.0 mg/dL   Nitrite, UA Negative Negative   Microscopic Examination See below:     Pertinent Imaging: CT Urogram 11/26/15 negative for GU patholog   Cystoscopy Procedure  Note  Patient identification was confirmed, informed consent was obtained, and patient was prepped using Betadine solution.  Lidocaine jelly was administered per urethral meatus.    Preoperative abx where received prior to procedure.    Procedure: - Flexible cystoscope introduced, without any difficulty.   - Thorough search of the bladder revealed:    normal urethral meatus    Mucosa with persistent erythematous patches on posterior wall, anterior wall, and lateral walls of bladder bilaterally.    no stones    no ulcers     no tumors    no urethral polyps    no trabeculation  - Ureteral orifices were normal in position and appearance.  Post-Procedure: - Patient tolerated the procedure well   Assessment & Plan:    1. Microscopic hematuria H/o microscopic hematuria s/y cystoscopy/ bladder biopsy 11/2015 - Urinalysis, Complete - lidocaine (XYLOCAINE) 2 % jelly 1 application; Place 1 application into the urethra once. - ciprofloxacin (CIPRO) tablet 500 mg; Take 1 tablet (500 mg total) by mouth once.  2. Chronic cystitis Discussed possibility of treating with daily suppressive chronic antibiotic 3 months to help with bladder inflammation and potentially dysuria She is agreeable with this plan Macrobid 100 mg daily 3 months  3. Bladder erythema Remain suspicious for carcinoma in situ, previous biopsy negative Cystoscopy today relatively stable from previous Will send urine cytology and repeat again in 3 months   Return in about 3 months (around 06/25/2016) for cysto/ possible cytology.  [name], MD  [facility] [address] [place],   [address] [phone]  Addendum: Urine cytology negative.

## 2016-03-30 ENCOUNTER — Telehealth: Payer: Self-pay | Admitting: Urology

## 2016-03-30 ENCOUNTER — Other Ambulatory Visit: Payer: Self-pay | Admitting: Urology

## 2016-03-30 ENCOUNTER — Encounter: Payer: Self-pay | Admitting: Urology

## 2016-03-30 NOTE — Telephone Encounter (Signed)
Please let Virginia Weaver know that her urine cytology was negative. This is great news.  [name], MD

## 2016-03-31 NOTE — Telephone Encounter (Signed)
Patient notified of results on vmail/SW

## 2016-05-30 ENCOUNTER — Other Ambulatory Visit: Payer: Self-pay | Admitting: Family Medicine

## 2016-05-30 DIAGNOSIS — K219 Gastro-esophageal reflux disease without esophagitis: Secondary | ICD-10-CM

## 2016-05-30 NOTE — Telephone Encounter (Signed)
Refill request

## 2016-06-07 ENCOUNTER — Other Ambulatory Visit: Payer: Self-pay | Admitting: Physician Assistant

## 2016-06-07 DIAGNOSIS — K219 Gastro-esophageal reflux disease without esophagitis: Secondary | ICD-10-CM

## 2016-06-24 ENCOUNTER — Ambulatory Visit (INDEPENDENT_AMBULATORY_CARE_PROVIDER_SITE_OTHER): Payer: 59 | Admitting: Urology

## 2016-06-24 VITALS — BP 129/75 | HR 60 | Ht 66.0 in | Wt 189.5 lb

## 2016-06-24 DIAGNOSIS — N3289 Other specified disorders of bladder: Secondary | ICD-10-CM

## 2016-06-24 DIAGNOSIS — R3129 Other microscopic hematuria: Secondary | ICD-10-CM | POA: Diagnosis not present

## 2016-06-24 DIAGNOSIS — N3020 Other chronic cystitis without hematuria: Secondary | ICD-10-CM

## 2016-06-24 LAB — MICROSCOPIC EXAMINATION: RBC MICROSCOPIC, UA: NONE SEEN /HPF (ref 0–?)

## 2016-06-24 LAB — URINALYSIS, COMPLETE
Bilirubin, UA: NEGATIVE
KETONES UA: NEGATIVE
Leukocytes, UA: NEGATIVE
NITRITE UA: POSITIVE — AB
Protein, UA: NEGATIVE
RBC, UA: NEGATIVE
Urobilinogen, Ur: 0.2 mg/dL (ref 0.2–1.0)
pH, UA: 6.5 (ref 5.0–7.5)

## 2016-06-24 MED ORDER — OXYBUTYNIN CHLORIDE 5 MG PO TABS: 5.0000 mg | ORAL_TABLET | Freq: Three times a day (TID) | ORAL | 11 refills | Status: DC | PRN

## 2016-06-24 MED ORDER — LIDOCAINE HCL 2 % EX GEL
1.0000 "application " | Freq: Once | CUTANEOUS | Status: AC
  Administered 2016-06-24: 1 via URETHRAL

## 2016-06-24 MED ORDER — CIPROFLOXACIN HCL 500 MG PO TABS
500.0000 mg | ORAL_TABLET | Freq: Once | ORAL | Status: AC
  Administered 2016-06-24: 500 mg via ORAL

## 2016-06-24 NOTE — Progress Notes (Signed)
06/24/2016 2:17 PM   Virginia Weaver 1951-08-18 [id number]  Referring provider: Margarita Rana, MD [address] [place], [place] [address]  Chief Complaint  Patient presents with  . Cysto    HPI: 65 year old female with a history of microscopic hematuria was taken to the operating room on 12/22/2015 for bladder biopsy of some suspicious patchy erythematous areas within the bladder. She does also have a history of dysuria. Biopsy results was consistent with chronic cystitis with lymphoid aggregates and mixed inflammatory infiltrates including eosinophils and plasma cells.  Previous urine cultures have been negative for bacterial infection.  No recent gross hematuria.   She does have chronic intermittent dysuria. She reports that on Sunday she has no issues but other days are uncomfortable. She is been keeping track of her habits and dietary log and cannot discern any triggers.  She is currently estrace cream and has found relief with using Ditropan 5 mg tid prn. She occasionally takes peridium as needed but finds Ditropan more helpful.   PMH: Past Medical History:  Diagnosis Date  . Acid reflux 04/02/2001  . Adiposity 04/20/1993  . Benign essential HTN 11/11/1993  . Diabetes mellitus without complication (HCC)    diet   . Heart murmur   . Hypertension   . Syncope and collapse 01/20/2009  . Tinnitus    left ear  . Urticaria 06/04/2015    Surgical History: Past Surgical History:  Procedure Laterality Date  . CYSTOSCOPY WITH BIOPSY N/A 12/22/2015   Procedure: CYSTOSCOPY WITH BIOPSY;  Surgeon: [name], MD;  Location: [facility] ORS;  Service: Urology;  Laterality: N/A;  . TUBAL LIGATION  1987  . TUMMY TUCK  06/17/2010    Home Medications:    Medication List       Accurate as of 06/24/16  2:17 PM. Always use your most recent med list.          ALPRAZolam 0.5 MG tablet Commonly known as:  XANAX TAKE 1 TABLET BY MOUTH EVERY 8 HOURS AS NEEDED   calcium  carbonate 1250 (500 Ca) MG tablet Commonly known as:  OS-CAL - dosed in mg of elemental calcium Take 1 tablet by mouth daily with breakfast.   Cholecalciferol 1000 units capsule Take 500 Units by mouth daily.   Cinnamon 500 MG capsule Take 500 mg by mouth daily.   COLLAGEN PO Take 1 tablet by mouth.   cyanocobalamin 1000 MCG tablet Take 1 tablet by mouth daily.   diphenhydrAMINE 25 MG tablet Commonly known as:  BENADRYL Take 25 mg by mouth every 6 (six) hours as needed.   estradiol 0.1 MG/GM vaginal cream Commonly known as:  ESTRACE Place 1 Applicatorful vaginally at bedtime.   hydrOXYzine 25 MG tablet Commonly known as:  ATARAX/VISTARIL TAKE 1 TABLET (25 MG TOTAL) BY MOUTH EVERY 8 (EIGHT) HOURS AS NEEDED (FOR HIVES/ITCHING).   levocetirizine 5 MG tablet Commonly known as:  XYZAL TAKE 1 TABLET EVERY DAY   Melatonin 10 MG Caps Take 10 mg by mouth at bedtime.   montelukast 10 MG tablet Commonly known as:  SINGULAIR TAKE 1 TABLET BY MOUTH DAILY   nitrofurantoin (macrocrystal-monohydrate) 100 MG capsule Commonly known as:  MACROBID Take 1 capsule (100 mg total) by mouth daily.   OCUVITE ADULT 50+ Caps Take 1 capsule by mouth daily. Reported on 03/17/2016   oxybutynin 5 MG tablet Commonly known as:  DITROPAN Take 1 tablet (5 mg total) by mouth every 8 (eight) hours as needed for bladder spasms.  pravastatin 20 MG tablet Commonly known as:  PRAVACHOL TAKE 1 TABLET BY MOUTH AT BEDTIME   PROBIOTIC ACIDOPHILUS PO Take 1 capsule by mouth daily.   ramipril 2.5 MG capsule Commonly known as:  ALTACE TAKE 1 CAPSULE BY MOUTH EVERY DAY       Allergies:  Allergies  Allergen Reactions  . Ibuprofen     Other reaction(s): Abdominal Pain Can't take NSAIDS secondary to erosions due to NSAIDS.  Marland Kitchen Nsaids     Can't take NSAIDS secondary to erosions due to NSAIDS.    Family History: Family History  Problem Relation Age of Onset  . Heart attack Mother   . Stroke  Mother   . Diabetes Mother   . Throat cancer Mother   . Heart attack Father   . Hypertension Father   . Glaucoma Father   . AAA (abdominal aortic aneurysm) Father   . Obesity Sister   . Throat cancer Maternal Grandmother   . Heart attack Maternal Grandfather   . CVA Paternal Grandmother   . Congestive Heart Failure Paternal Grandmother   . Heart attack Paternal Grandfather     Social History:  reports that she has never smoked. She has never used smokeless tobacco. She reports that she does not drink alcohol or use drugs.  Physical Exam: BP 129/75   Pulse 60   Ht 5\' 6"  (1.676 m)   Wt 189 lb 8 oz (86 kg)   BMI 30.59 kg/m   Constitutional:  Alert and oriented, No acute distress. HEENT: [place] AT, moist mucus membranes.  Trachea midline, no masses. Cardiovascular: No clubbing, cyanosis, or edema. Respiratory: Normal respiratory effort, no increased work of breathing. GI: Abdomen is soft, nontender, nondistended, no abdominal masses. GU: No CVA tenderness.   Normal external genitialia and urethral meatus.   Skin: No rashes, bruises or suspicious lesions. Neurologic: Grossly intact, no focal deficits, moving all 4 extremities. Psychiatric: Normal mood and affect.  Laboratory Data: Lab Results  Component Value Date   WBC 2.8 (L) 03/17/2016   HCT 41.7 03/17/2016   MCV 91 03/17/2016   PLT 167 03/17/2016    Lab Results  Component Value Date   CREATININE 0.74 03/17/2016    Lab Results  Component Value Date   HGBA1C 5.7 (H) 03/17/2016    Urinalysis UA today +nitirite but taking pyridium, otherwise negative, see EPIC  Pertinent Imaging: CT Urogram 11/26/15 negative for GU patholog  Cystoscopy Procedure Note  Patient identification was confirmed, informed consent was obtained, and patient was prepped using Betadine solution.  Lidocaine jelly was administered per urethral meatus.    Preoperative abx where received prior to procedure.    Procedure: - Flexible cystoscope  introduced, without any difficulty.   - Thorough search of the bladder revealed:    normal urethral meatus    Mucosa with improved small patches of erythema adjacent to biopsy scar, <1 cm on right posterior wall and anterior wall but significantly less than previously appreciated    no stones    no ulcers     no tumors    no urethral polyps    no trabeculation  - Ureteral orifices were normal in position and appearance.  Post-Procedure: - Patient tolerated the procedure well   Assessment & Plan:    1. Microscopic hematuria H/o microscopic hematuria s/y cystoscopy/ bladder biopsy and CTU 11/2015 - Urinalysis, Complete - lidocaine (XYLOCAINE) 2 % jelly 1 application; Place 1 application into the urethra once. - ciprofloxacin (CIPRO) tablet 500 mg;  Take 1 tablet (500 mg total) by mouth once.  2. Chronic cystitis Previous biopsy consistent with chronic cystitis, not infectious Relief with Ditropan 5 mg 3 times a day when necessary, refilled today  3. Bladder erythema Previous biopsy negative for CIS Cystoscopy today improved from previous cystoscopy with improved erythema  Continue estrace Repeat urine cytology today, will call with result Given improved appearance of bladder today, reassuring as long as cytology remains unremarkble   Return in about 1 year (around 06/24/2017) for cytology, symptoms recheck.  [name], MD  [facility] [address] [place], [place] [address] [phone]  Addendum: Urine cytology negative.

## 2016-07-05 ENCOUNTER — Other Ambulatory Visit: Payer: Self-pay | Admitting: Urology

## 2016-08-02 ENCOUNTER — Encounter: Payer: Self-pay | Admitting: Physician Assistant

## 2016-08-02 ENCOUNTER — Ambulatory Visit (INDEPENDENT_AMBULATORY_CARE_PROVIDER_SITE_OTHER): Payer: 59 | Admitting: Physician Assistant

## 2016-08-02 VITALS — BP 118/72 | HR 60 | Temp 98.5°F | Resp 16 | Wt 193.0 lb

## 2016-08-02 DIAGNOSIS — R252 Cramp and spasm: Secondary | ICD-10-CM

## 2016-08-02 DIAGNOSIS — Z87440 Personal history of urinary (tract) infections: Secondary | ICD-10-CM | POA: Diagnosis not present

## 2016-08-02 DIAGNOSIS — R5383 Other fatigue: Secondary | ICD-10-CM | POA: Diagnosis not present

## 2016-08-02 LAB — POCT URINALYSIS DIPSTICK
Bilirubin, UA: NEGATIVE
Blood, UA: NEGATIVE
Glucose, UA: NEGATIVE
Ketones, UA: NEGATIVE
Leukocytes, UA: NEGATIVE
Nitrite, UA: NEGATIVE
Protein, UA: NEGATIVE
Spec Grav, UA: 1.01
Urobilinogen, UA: 0.2
pH, UA: 7.5

## 2016-08-02 NOTE — Progress Notes (Signed)
Patient: Virginia Weaver Female    DOB: 1951/02/10   65 y.o.   MRN: [id number] Visit Date: 08/02/2016  Today's Provider: Trinna Post, PA-C   Chief Complaint  Patient presents with  . Fatigue    Has been going on for several months.   Subjective:    HPI   Fatigue: Patient is 65 year old with PMH significant for gastric erosions, chronic cysitits and insomnia who presents complaining of fatigue. Symptoms began several months ago. Sentinal symptom the patient feels fatigue began with: none. Symptoms of her fatigue have been feelings of depression and general malaise. Patient describes the following psychologic symptoms: depression and stress at work.  Patient denies fever, exercise intolerance, unusual rashes, cold intolerance, constipation and change in hair texture., GI blood loss and witnessed or suspected sleep apnea. Symptoms have gradually worsened. Severity has been symptoms bothersome, but easily able to carry out all usual work/school/family activities. Previous visits for this problem: none.   Patient reports some swelling her legs bilaterally. This is improved with use of compression hose, but patient doesn't like wearing these. 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 Patient cites her job as a significant source of stress. Patient also cites the death of her parents, three years ago and one year ago, as another source of stress. She reports three to four hours of sleep on a good night. She often will go to bed at 2-3 Am. Patient also reports muscle cramps in her feet and legs. She started taking OTC potassium and magnesium, which she said helped.  Depression screen PHQ 2/9 08/02/2016  Decreased Interest 0  Down, Depressed, Hopeless 0  PHQ - 2 Score 0    Allergies  Allergen Reactions  . Ibuprofen     Other reaction(s): Abdominal Pain Can't take NSAIDS secondary to  erosions due to NSAIDS.  Marland Kitchen Nsaids     Can't take NSAIDS secondary to erosions due to NSAIDS.     Current Outpatient Prescriptions:  .  ALPRAZolam (XANAX) 0.5 MG tablet, TAKE 1 TABLET BY MOUTH EVERY 8 HOURS AS NEEDED, Disp: 30 tablet, Rfl: 5 .  calcium carbonate (OS-CAL - DOSED IN MG OF ELEMENTAL CALCIUM) 1250 (500 Ca) MG tablet, Take 1 tablet by mouth daily with breakfast., Disp: , Rfl:  .  Cholecalciferol 1000 UNITS capsule, Take 500 Units by mouth daily. , Disp: , Rfl:  .  Cinnamon 500 MG capsule, Take 500 mg by mouth daily., Disp: , Rfl:  .  COLLAGEN PO, Take 1 tablet by mouth., Disp: , Rfl:  .  cyanocobalamin 1000 MCG tablet, Take 1 tablet by mouth daily., Disp: , Rfl:  .  diphenhydrAMINE (BENADRYL) 25 MG tablet, Take 25 mg by mouth every 6 (six) hours as needed., Disp: , Rfl:  .  estradiol (ESTRACE) 0.1 MG/GM vaginal cream, Place 1 Applicatorful vaginally at bedtime., Disp: 42.5 g, Rfl: 12 .  hydrOXYzine (ATARAX/VISTARIL) 25 MG tablet, TAKE 1 TABLET (25 MG TOTAL) BY MOUTH EVERY 8 (EIGHT) HOURS AS NEEDED (FOR HIVES/ITCHING)., Disp: 30 tablet, Rfl: 0 .  Lactobacillus (PROBIOTIC ACIDOPHILUS PO), Take 1 capsule by mouth daily., Disp: , Rfl:  .  levocetirizine (XYZAL) 5 MG tablet, TAKE 1 TABLET EVERY DAY, Disp: 30 tablet, Rfl: 5 .  Melatonin 10 MG CAPS, Take 10 mg by mouth at bedtime., Disp: , Rfl:  .  montelukast (  SINGULAIR) 10 MG tablet, TAKE 1 TABLET BY MOUTH DAILY, Disp: 30 tablet, Rfl: 6 .  Multiple Vitamins-Minerals (OCUVITE ADULT 50+) CAPS, Take 1 capsule by mouth daily. Reported on 03/17/2016, Disp: , Rfl:  .  pravastatin (PRAVACHOL) 20 MG tablet, TAKE 1 TABLET BY MOUTH AT BEDTIME, Disp: 30 tablet, Rfl: 5 .  ramipril (ALTACE) 2.5 MG capsule, TAKE 1 CAPSULE BY MOUTH EVERY DAY, Disp: 30 capsule, Rfl: 5 .  nitrofurantoin, macrocrystal-monohydrate, (MACROBID) 100 MG capsule, Take 1 capsule (100 mg total) by mouth daily. (Patient not taking: Reported on 08/02/2016), Disp: 30 capsule, Rfl:  2 .  oxybutynin (DITROPAN) 5 MG tablet, Take 1 tablet (5 mg total) by mouth every 8 (eight) hours as needed for bladder spasms. (Patient not taking: Reported on 08/02/2016), Disp: 30 tablet, Rfl: 11  Review of Systems  Constitutional: Positive for fatigue. Negative for activity change, appetite change, chills, diaphoresis, fever and unexpected weight change.  Respiratory: Negative.   Cardiovascular: Positive for palpitations (Chronic issue) and leg swelling (Feet are swelling left worse than right.  Just started a few weeks ago. ). Negative for chest pain.  Gastrointestinal: Negative.   Musculoskeletal: Positive for myalgias (Muscle cramps ).  Allergic/Immunologic: Positive for environmental allergies.  Neurological: Positive for weakness and headaches. Negative for dizziness and light-headedness.  Hematological: Bruises/bleeds easily (More bruising than usual. ).  Psychiatric/Behavioral: Positive for decreased concentration and sleep disturbance. Negative for agitation, behavioral problems, hallucinations, self-injury and suicidal ideas. The patient is nervous/anxious (Xanax helps). The patient is not hyperactive.     Social History  Substance Use Topics  . Smoking status: Never Smoker  . Smokeless tobacco: Never Used  . Alcohol use No   Objective:   BP 118/72 (BP Location: Right Arm, Patient Position: Sitting, Cuff Size: Large)   Pulse 60   Temp 98.5 F (36.9 C) (Oral)   Resp 16   Wt 193 lb (87.5 kg)   BMI 31.15 kg/m   Physical Exam  Constitutional: She is oriented to person, place, and time. Vital signs are normal. She appears well-developed and well-nourished. No distress.  Eyes: Conjunctivae are normal. Pupils are equal, round, and reactive to light.  Neck: No JVD present. Carotid bruit is not present.  Cardiovascular: Normal rate, regular rhythm and normal heart sounds.   Pulmonary/Chest: Effort normal and breath sounds normal. No respiratory distress. She has no wheezes.   Abdominal: Soft. Bowel sounds are normal.  Musculoskeletal: She exhibits no edema, tenderness or deformity.  Neurological: She is alert and oriented to person, place, and time. She has normal strength. She exhibits normal muscle tone.  Skin: Skin is warm and dry. She is not diaphoretic.  Psychiatric: Her speech is normal and behavior is normal. Thought content normal. Her mood appears anxious. Her affect is not blunt and not labile. She does not exhibit a depressed mood.        Assessment & Plan:      Problem List Items Addressed This Visit    None    Visit Diagnoses    Fatigue, unspecified type    -  Primary   Relevant Orders   TSH   HgB A1c   Ambulatory referral to Psychology   CBC   Muscle cramps       Relevant Orders   Magnesium   History of chronic urinary tract infection       Relevant Orders   POCT urinalysis dipstick (Completed)     Fatigue  Plan addressed as above. Cardiology ran  exercise stress test which was normal. Last echo a few years ago showed normal LV function and some mitral/tricuspid insufficiency. Will order Tsh to check for thyroid dysfunction. Last A1c 4 months ago was prediabetic and patient is aware of her weight gain and impact on this. Will check again to see if this has increased and is contributing to fatigue. Counseled on lifestyle modifications like healthy eating and exercise, which patient agrees to. CBC to check for anemia and platelets. Last CMP 5 mo ago was normal. Patient aware of how environmental and job stressors are contributing to her energy. Referred to psychology.   Muscle Cramps  Last potassium in May was normal. Advised pt to stop taking supplementary potassium.  Hx of chronic UTI  Advised patient to stop taking benadryl and Vistaril, as they could contribute to urinary retention and promote UTI. Also counseled patient that these medications when taken at night can cause "hungover" feeling in morning due to long half life. Patient  agreed to stop taking these. Urinalysis in office today was normal. Patient no longer on Macrobid suppression.  Will follow up with patient on lab results.  The entirety of the information documented in the History of Present Illness, Review of Systems and Physical Exam were personally obtained by me. Portions of this information were initially documented by Ashley Royalty, CMA and reviewed by me for thoroughness and accuracy.   Patient Instructions  Fatigue Fatigue is feeling tired all of the time, a lack of energy, or a lack of motivation. Occasional or mild fatigue is often a normal response to activity or life in general. However, long-lasting (chronic) or extreme fatigue may indicate an underlying medical condition. HOME CARE INSTRUCTIONS  Watch your fatigue for any changes. The following actions may help to lessen any discomfort you are feeling:  Talk to your health care provider about how much sleep you need each night. Try to get the required amount every night.  Take medicines only as directed by your health care provider.  Eat a healthy and nutritious diet. Ask your health care provider if you need help changing your diet.  Drink enough fluid to keep your urine clear or pale yellow.  Practice ways of relaxing, such as yoga, meditation, massage therapy, or acupuncture.  Exercise regularly.   Change situations that cause you stress. Try to keep your work and personal routine reasonable.  Do not abuse illegal drugs.  Limit alcohol intake to no more than 1 drink per day for nonpregnant women and 2 drinks per day for men. One drink equals 12 ounces of beer, 5 ounces of wine, or 1 ounces of hard liquor.  Take a multivitamin, if directed by your health care provider. SEEK MEDICAL CARE IF:   Your fatigue does not get better.  You have a fever.   You have unintentional weight loss or gain.  You have headaches.   You have difficulty:   Falling asleep.  Sleeping  throughout the night.  You feel angry, guilty, anxious, or sad.   You are unable to have a bowel movement (constipation).   You skin is dry.   Your legs or another part of your body is swollen.  SEEK IMMEDIATE MEDICAL CARE IF:   You feel confused.   Your vision is blurry.  You feel faint or pass out.   You have a severe headache.   You have severe abdominal, pelvic, or back pain.   You have chest pain, shortness of breath, or an irregular or  fast heartbeat.   You are unable to urinate or you urinate less than normal.   You develop abnormal bleeding, such as bleeding from the rectum, vagina, nose, lungs, or nipples.  You vomit blood.   You have thoughts about harming yourself or committing suicide.   You are worried that you might harm someone else.    This information is not intended to replace advice given to you by your health care provider. Make sure you discuss any questions you have with your health care provider.   Document Released: 08/07/2007 Document Revised: 10/31/2014 Document Reviewed: 02/11/2014 Elsevier Interactive Patient Education 2016 [place], [facility]

## 2016-08-02 NOTE — Patient Instructions (Signed)
Fatigue  Fatigue is feeling tired all of the time, a lack of energy, or a lack of motivation. Occasional or mild fatigue is often a normal response to activity or life in general. However, long-lasting (chronic) or extreme fatigue may indicate an underlying medical condition.  HOME CARE INSTRUCTIONS   Watch your fatigue for any changes. The following actions may help to lessen any discomfort you are feeling:  · Talk to your health care provider about how much sleep you need each night. Try to get the required amount every night.  · Take medicines only as directed by your health care provider.  · Eat a healthy and nutritious diet. Ask your health care provider if you need help changing your diet.  · Drink enough fluid to keep your urine clear or pale yellow.  · Practice ways of relaxing, such as yoga, meditation, massage therapy, or acupuncture.  · Exercise regularly.    · Change situations that cause you stress. Try to keep your work and personal routine reasonable.  · Do not abuse illegal drugs.  · Limit alcohol intake to no more than 1 drink per day for nonpregnant women and 2 drinks per day for men. One drink equals 12 ounces of beer, 5 ounces of wine, or 1½ ounces of hard liquor.  · Take a multivitamin, if directed by your health care provider.  SEEK MEDICAL CARE IF:   · Your fatigue does not get better.  · You have a fever.    · You have unintentional weight loss or gain.  · You have headaches.    · You have difficulty:      Falling asleep.    Sleeping throughout the night.  · You feel angry, guilty, anxious, or sad.     · You are unable to have a bowel movement (constipation).    · You skin is dry.     · Your legs or another part of your body is swollen.    SEEK IMMEDIATE MEDICAL CARE IF:   · You feel confused.    · Your vision is blurry.  · You feel faint or pass out.    · You have a severe headache.    · You have severe abdominal, pelvic, or back pain.    · You have chest pain, shortness of breath, or an  irregular or fast heartbeat.    · You are unable to urinate or you urinate less than normal.    · You develop abnormal bleeding, such as bleeding from the rectum, vagina, nose, lungs, or nipples.  · You vomit blood.     · You have thoughts about harming yourself or committing suicide.    · You are worried that you might harm someone else.       This information is not intended to replace advice given to you by your health care provider. Make sure you discuss any questions you have with your health care provider.     Document Released: 08/07/2007 Document Revised: 10/31/2014 Document Reviewed: 02/11/2014  Elsevier Interactive Patient Education ©2016 Elsevier Inc[employer]

## 2016-08-03 ENCOUNTER — Telehealth: Payer: Self-pay

## 2016-08-03 LAB — MAGNESIUM: Magnesium: 2.2 mg/dL (ref 1.6–2.3)

## 2016-08-03 LAB — CBC
Hematocrit: 40.3 % (ref 34.0–46.6)
Hemoglobin: 13.6 g/dL (ref 11.1–15.9)
MCH: 29.6 pg (ref 26.6–33.0)
MCHC: 33.7 g/dL (ref 31.5–35.7)
MCV: 88 fL (ref 79–97)
Platelets: 154 10*3/uL (ref 150–379)
RBC: 4.6 x10E6/uL (ref 3.77–5.28)
RDW: 13.1 % (ref 12.3–15.4)
WBC: 4 10*3/uL (ref 3.4–10.8)

## 2016-08-03 LAB — HEMOGLOBIN A1C
Est. average glucose Bld gHb Est-mCnc: 111 mg/dL
Hgb A1c MFr Bld: 5.5 % (ref 4.8–5.6)

## 2016-08-03 LAB — TSH: TSH: 1.21 u[IU]/mL (ref 0.450–4.500)

## 2016-08-03 NOTE — Telephone Encounter (Signed)
LMTCB 08/03/2016  Thanks,   -Mickel Baas

## 2016-08-03 NOTE — Telephone Encounter (Signed)
-----   Message from Trinna Post, [place] sent at 08/03/2016  8:23 AM EDT ----- Labs came back within normal limits. A1C is actually lower than last time. Continue with lifestyle modifications. Patient may discontinue magnesium and potassium supplements. Patient may keep psychology referral if she chooses. Please advise patient.

## 2016-08-03 NOTE — Progress Notes (Signed)
Labs came back within normal limits. A1C is actually lower than last time. Continue with lifestyle modifications. Patient may discontinue magnesium and potassium supplements. Patient may keep psychology referral if she chooses. Please advise patient.

## 2016-08-04 NOTE — Telephone Encounter (Signed)
Advised pt of lab results. Pt verbally acknowledges understanding. Emily Drozdowski, CMA   

## 2016-08-04 NOTE — Telephone Encounter (Signed)
Pt is returning call.  CB#[phone]/MW °

## 2016-08-24 ENCOUNTER — Ambulatory Visit (INDEPENDENT_AMBULATORY_CARE_PROVIDER_SITE_OTHER): Payer: 59 | Admitting: Licensed Clinical Social Worker

## 2016-08-24 DIAGNOSIS — F4323 Adjustment disorder with mixed anxiety and depressed mood: Secondary | ICD-10-CM | POA: Diagnosis not present

## 2016-08-25 ENCOUNTER — Other Ambulatory Visit: Payer: Self-pay | Admitting: Family Medicine

## 2016-08-25 DIAGNOSIS — I10 Essential (primary) hypertension: Secondary | ICD-10-CM

## 2016-08-25 DIAGNOSIS — E7800 Pure hypercholesterolemia, unspecified: Secondary | ICD-10-CM

## 2016-08-25 NOTE — Progress Notes (Signed)
Comprehensive Clinical Assessment (CCA) Note  08/25/2016 Virginia Weaver [id number]  Visit Diagnosis:      ICD-9-CM ICD-10-CM   1. Adjustment disorder with mixed anxiety and depressed mood 309.28 F43.23       CCA Part One  Part One has been completed on paper by the patient.  (See scanned document in Chart Review)  CCA Part Two A  Intake/Chief Complaint:  CCA Intake With Chief Complaint CCA Part Two Date: 08/24/16 CCA Part Two Time: 02-17-04 Chief Complaint/Presenting Problem: My doctor wants me to see somebody since I am stressed. Patients Currently Reported Symptoms/Problems: I feel like I am pulling a thousand ball chain.  I gained about 44pounds.  My husband died in Feb 17, 2008.  I lost 150pounds a few years ago.  My mom died about 3 years ago.  My dad died almost a year ago.  I don't like going to the gym anymore.  I don't care about much anymore.   Individual's Strengths: tough as nails, first born, no or quit is not in my vocabulary,  Individual's Preferences: to learn to relax Individual's Abilities: motivated to change Type of Services Patient Feels Are Needed: therapy  Mental Health Symptoms Depression:  Depression: Change in energy/activity, Fatigue, Increase/decrease in appetite, Sleep (too much or little), Weight gain/loss, Irritability  Mania:  Mania: N/A  Anxiety:   Anxiety: Worrying, Tension, Sleep, Irritability  Psychosis:  Psychosis: N/A  Trauma:  Trauma: N/A  Obsessions:  Obsessions: N/A  Compulsions:  Compulsions: N/A  Inattention:  Inattention: N/A  Hyperactivity/Impulsivity:  Hyperactivity/Impulsivity: N/A  Oppositional/Defiant Behaviors:  Oppositional/Defiant Behaviors: N/A  Borderline Personality:  Emotional Irregularity: N/A  Other Mood/Personality Symptoms:      Mental Status Exam Appearance and self-care  Stature:  Stature: Average  Weight:  Weight: Overweight  Clothing:  Clothing: Neat/clean  Grooming:  Grooming: Normal  Cosmetic use:  Cosmetic Use:  Age appropriate  Posture/gait:  Posture/Gait: Normal  Motor activity:  Motor Activity: Not Remarkable  Sensorium  Attention:  Attention: Normal  Concentration:  Concentration: Normal  Orientation:  Orientation: X5  Recall/memory:  Recall/Memory: Normal  Affect and Mood  Affect:  Affect: Appropriate  Mood:  Mood: Depressed  Relating  Eye contact:  Eye Contact: None  Facial expression:  Facial Expression: Responsive  Attitude toward examiner:  Attitude Toward Examiner: Cooperative  Thought and Language  Speech flow: Speech Flow: Normal  Thought content:     Preoccupation:     Hallucinations:     Organization:     Transport planner of Knowledge:  Fund of Knowledge: Average  Intelligence:  Intelligence: Average  Abstraction:  Abstraction: Normal  Judgement:  Judgement: Normal  Reality Testing:  Reality Testing: Adequate  Insight:  Insight: Good  Decision Making:  Decision Making: Normal  Social Functioning  Social Maturity:  Social Maturity: Responsible  Social Judgement:  Social Judgement: Normal  Stress  Stressors:  Stressors: Work  Coping Ability:  Coping Ability: English as a second language teacher Deficits:     Supports:      Family and Psychosocial History: Family history Marital status: Widowed Widowed, when?: 02-17-2008 Are you sexually active?: No What is your sexual orientation?: heterosexual Does patient have children?: Yes Virginia Weaver 02-16-2037, Virginia Weaver 30) How many children?: 2 How is patient's relationship with their children?: we are all very close.  we hang out all of the time.  We go gambling.  Childhood History:  Childhood History By whom was/is the patient raised?: Both parents Additional childhood history information: I had  a wonderful childhood.  I was raised in a good [place] home with wonderful parents. Description of patient's relationship with caregiver when they were a child: Mother: very close.  Father: very close Patient's description of current relationship  with people who raised him/her: Mother: deceased Father: deceased How were you disciplined when you got in trouble as a child/adolescent?: spanking Does patient have siblings?: Yes (Virginia Weaver 53, Virginia Weaver 60) Number of Siblings: 2 Description of patient's current relationship with siblings: I don't have a good relationship with Virginia Weaver; we have never gotten along.  I have a good relationship with Virginia Weaver. 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   What is the longest time patient has a held a job?: 78yrs Where was the patient employed at that time?: [employer] Has patient ever been in the [employer]?: No  Education: Education Name of [place]: [place]  Did Teacher, adult education From [employer]?: Yes Did Physicist, medical?: No Did Heritage manager?: No Did You Have An Individualized Education Program (IIEP): No Did You Have Any Difficulty At School?: No  Religion: Religion/Spirituality Are You A Religious Person?: Yes What is Your Religious Affiliation?: Baptist How Might This Affect Treatment?: denies  Leisure/Recreation: Leisure / Recreation Leisure and Hobbies: gambling, church, quiet time   Exercise/Diet: Exercise/Diet Do You Exercise?: No Have You Gained or Lost A  Significant Amount of Weight in the Past Six Months?: Yes-Gained Number of Pounds Gained: 41 Do You Follow a Special Diet?: No Do You Have Any Trouble Sleeping?: Yes Explanation of Sleeping Difficulties: about 4 hours per night; difficulty falling asleep  CCA Part Two C  Alcohol/Drug Use: Alcohol / Drug Use Pain Medications: denies Prescriptions: Altaz, Provocal, Xanex, Singular, vaginal cream Over the Counter: Calcium, Vitamin D, Collagen, Hair & Nails Pill, Cinnamon, Vitamin B12, Eye Vitamin History of alcohol / drug use?: No history of alcohol / drug abuse                      CCA Part Three  ASAM's:  Six Dimensions of Multidimensional Assessment  Dimension 1:  Acute Intoxication and/or Withdrawal Potential:     Dimension 2:  Biomedical Conditions and Complications:     Dimension 3:  Emotional, Behavioral, or Cognitive Conditions and Complications:     Dimension 4:  Readiness to Change:     Dimension 5:  Relapse, Continued use, or Continued Problem Potential:     Dimension 6:  Recovery/Living Environment:      Substance use Disorder (SUD)    Social Function:  Social Functioning Social Maturity: Responsible Social Judgement: Normal  Stress:  Stress Stressors: Work Coping Ability: Overwhelmed Patient Takes Medications The Way The Doctor Instructed?: Yes Priority Risk: Moderate Risk  Risk Assessment- Self-Harm Potential: Risk Assessment For Self-Harm Potential Thoughts of Self-Harm: No current thoughts Method: No plan Availability of Means:  No access/NA  Risk Assessment -Dangerous to Others Potential: Risk Assessment For Dangerous to Others Potential Method: No Plan Availability of Means: No access or NA Intent: Vague intent or NA Notification Required: No need or identified person  DSM5 Diagnoses: Patient Active Problem List   Diagnosis Date Noted  . B12 deficiency 03/17/2016  . Allergic rhinitis 02/18/2016  . Urticaria 06/04/2015  . Anxiety  04/29/2015  . Depression, major, single episode, moderate ([place]) 04/29/2015  . Bilateral tinnitus 04/29/2015  . Avitaminosis D 04/29/2015  . Hypertension 03/30/2015  . Hypercholesteremia 03/30/2015  . Cardiovascular symptoms 01/12/2010  . Syncope and collapse 01/20/2009  . Fam hx-ischem heart disease 11/25/2008  . Acid reflux 04/02/2001  . Benign essential HTN 11/11/1993  . Adiposity 04/20/1993    Patient Centered Plan: Patient is on the following Treatment Plan(s):  Pt and counselor to develop plan at next session w/ pt identified goals.  Recommendations for Services/Supports/Treatments:    Treatment Plan Summary:    Referrals to Alternative Service(s): Referred to Alternative Service(s):   Place:   Date:   Time:    Referred to Alternative Service(s):   Place:   Date:   Time:    Referred to Alternative Service(s):   Place:   Date:   Time:    Referred to Alternative Service(s):   Place:   Date:   Time:     [place]

## 2016-09-05 ENCOUNTER — Other Ambulatory Visit: Payer: Self-pay | Admitting: Family Medicine

## 2016-09-05 DIAGNOSIS — J309 Allergic rhinitis, unspecified: Secondary | ICD-10-CM

## 2016-09-05 NOTE — Telephone Encounter (Signed)
Jenni's Pt.

## 2016-09-13 ENCOUNTER — Ambulatory Visit: Payer: 59 | Admitting: Licensed Clinical Social Worker

## 2016-09-13 DIAGNOSIS — F4323 Adjustment disorder with mixed anxiety and depressed mood: Secondary | ICD-10-CM

## 2016-09-13 NOTE — Progress Notes (Signed)
   THERAPIST PROGRESS NOTE  Session Time: 62mn  Participation Level: Active  Behavioral Response: Casual and NeatAlertAnxious  Type of Therapy: Individual Therapy  Treatment Goals addressed: Diagnosis: Adjustment Disorder  Interventions: CBT, Motivational Interviewing and Solution Focused  Summary: Virginia TIMMis a 65y.o. female who presents with continued symptoms of her diagnosis. LCSW discussed what psychotherapy is and is not and the importance of the therapeutic relationship to include open and honest communication between client and therapist and building trust.  Reviewed advantages and disadvantages of the therapeutic process and limitations to the therapeutic relationship including LCSW's role in maintaining the safety of the client, others and those in client's care. Therapist met with Patient in an initial therapy session to assess current mood and to build rapport. Therapist engaged Patient in discussion about her life and what is going well for her. Therapist provided support for Patient as she shared details about her life, her current stressors, mood, coping skills, her past, and her children. Therapist prompted Patient to discuss her support system and ways that she manages her daily stress, anger, and frustrations.    Suicidal/Homicidal: No  Therapist Response: LCSW provided Patient with ongoing emotional support and encouragement.  Normalized her feelings.  Commended Patient on her progress and reinforced the importance of client staying focused on her own strengths and resources and resiliency. Processed various strategies for dealing with stressors. Completed treatment plan.   Plan: Return again in 2 weeks.  Diagnosis: Axis I: Adjustment Disorder with Mixed Emotional Features    Axis II: No diagnosis    NLubertha South LCSW 09/13/2016

## 2016-09-29 ENCOUNTER — Ambulatory Visit: Payer: 59 | Admitting: Licensed Clinical Social Worker

## 2016-10-05 ENCOUNTER — Encounter: Payer: Self-pay | Admitting: Family Medicine

## 2016-10-05 ENCOUNTER — Ambulatory Visit (INDEPENDENT_AMBULATORY_CARE_PROVIDER_SITE_OTHER): Payer: 59 | Admitting: Family Medicine

## 2016-10-05 VITALS — BP 126/68 | HR 76 | Temp 97.7°F | Resp 16 | Wt 197.6 lb

## 2016-10-05 DIAGNOSIS — J0100 Acute maxillary sinusitis, unspecified: Secondary | ICD-10-CM

## 2016-10-05 MED ORDER — AMOXICILLIN-POT CLAVULANATE 875-125 MG PO TABS: 1.0000 | ORAL_TABLET | Freq: Two times a day (BID) | ORAL | 0 refills | Status: DC

## 2016-10-05 NOTE — Progress Notes (Signed)
Subjective:     Patient ID: Virginia Weaver, female   DOB: 1950/12/01, 66 y.o.   MRN: [id number]  HPI  Chief Complaint  Patient presents with  . Sinusitis    Patient comes in office today with concerns of headache, bilateral ear pressure and sinus pressure for the past 2-3 weeks. Patient reports that she has been taking otc Mucinex DM and Robitussin for relief.   States her symptoms started like allergies without cold sx. Has been compliant with allergy medication including steroid nasal spray along with decongestants with little improvement. States congestion and pressure is locked in. Occasionally will cough up a small amount of bloody/green sputum.   Review of Systems     Objective:   Physical Exam  Constitutional: She appears well-developed and well-nourished. No distress.  Ears: T.M's intact without inflammation Sinuses: mild maxillary sinus tenderness Throat: no tonsillar enlargement or exudate Neck: no cervical adenopathy Lungs: clear     Assessment:    1. Acute non-recurrent maxillary sinusitis - amoxicillin-clavulanate (AUGMENTIN) 875-125 MG tablet; Take 1 tablet by mouth 2 (two) times daily.  Dispense: 20 tablet; Refill: 0    Plan:    Add Mucinex D

## 2016-10-05 NOTE — Patient Instructions (Signed)
Get Mucinex D and continue steroid nasal spray.

## 2016-10-31 ENCOUNTER — Other Ambulatory Visit: Payer: Self-pay | Admitting: Physician Assistant

## 2016-10-31 DIAGNOSIS — J309 Allergic rhinitis, unspecified: Secondary | ICD-10-CM

## 2016-11-24 ENCOUNTER — Other Ambulatory Visit: Payer: Self-pay | Admitting: Family Medicine

## 2016-11-24 DIAGNOSIS — L509 Urticaria, unspecified: Secondary | ICD-10-CM

## 2016-11-24 NOTE — Telephone Encounter (Signed)
Please see refill request  She has established with you. I gave this to her originally in 2016 for stress induced urticaria

## 2016-12-14 ENCOUNTER — Ambulatory Visit: Payer: Self-pay | Admitting: Physician Assistant

## 2016-12-15 ENCOUNTER — Telehealth: Payer: Self-pay

## 2016-12-15 ENCOUNTER — Ambulatory Visit (INDEPENDENT_AMBULATORY_CARE_PROVIDER_SITE_OTHER): Payer: 59 | Admitting: Physician Assistant

## 2016-12-15 ENCOUNTER — Encounter: Payer: Self-pay | Admitting: Physician Assistant

## 2016-12-15 VITALS — BP 150/80 | HR 65 | Temp 98.0°F | Resp 16 | Wt 199.0 lb

## 2016-12-15 DIAGNOSIS — R252 Cramp and spasm: Secondary | ICD-10-CM

## 2016-12-15 DIAGNOSIS — F411 Generalized anxiety disorder: Secondary | ICD-10-CM

## 2016-12-15 DIAGNOSIS — L409 Psoriasis, unspecified: Secondary | ICD-10-CM | POA: Diagnosis not present

## 2016-12-15 DIAGNOSIS — T466X5A Adverse effect of antihyperlipidemic and antiarteriosclerotic drugs, initial encounter: Secondary | ICD-10-CM

## 2016-12-15 DIAGNOSIS — M791 Myalgia, unspecified site: Principal | ICD-10-CM

## 2016-12-15 MED ORDER — COQ10 200 MG PO CAPS: 1.0000 | ORAL_CAPSULE | Freq: Every day | ORAL | 0 refills | Status: AC

## 2016-12-15 MED ORDER — ALPRAZOLAM 0.5 MG PO TABS: 0.5000 mg | ORAL_TABLET | Freq: Three times a day (TID) | ORAL | 5 refills | Status: DC | PRN

## 2016-12-15 MED ORDER — TRIAMCINOLONE ACETONIDE 0.1 % EX CREA: 1.0000 "application " | TOPICAL_CREAM | Freq: Two times a day (BID) | CUTANEOUS | 0 refills | Status: DC

## 2016-12-15 MED ORDER — CYCLOBENZAPRINE HCL 5 MG PO TABS: 5.0000 mg | ORAL_TABLET | Freq: Every day | ORAL | 1 refills | Status: DC

## 2016-12-15 NOTE — Telephone Encounter (Signed)
Prescription for Alprazolam 0.5MG  tablet was called in to [facility].  Thanks,  -Chrsitopher Wik

## 2016-12-15 NOTE — Progress Notes (Signed)
Patient: Virginia Weaver Female    DOB: 1950-12-03   66 y.o.   MRN: [id number] Visit Date: 12/15/2016  Today's Provider: [name], PA-C   Chief Complaint  Patient presents with  . Leg and back cramping   Subjective:    HPI Patient is here today with c/o leg and back cramp. She reports that she is not sleeping well because she is cramping all over. She has had this for a while and was seen back in 07/2016 for similar symptoms with intense fatigue. She does have a stressful job but she has had the same job for over 20 years and reports the stress hasn't changed much. She does have a new boss she is under that has caused some new stress, but nothing abnormal.   When she was seen in October it was felt to be secondary to depression/anxiety/stress. She was sent to see a counselor. She had 2 visits with the counselor and did think that helped but she was unable to keep going due to cost. She did not think that helped the cramping and fatigue however.     Allergies  Allergen Reactions  . Ibuprofen     Other reaction(s): Abdominal Pain Can't take NSAIDS secondary to erosions due to NSAIDS.  Marland Kitchen Nsaids     Can't take NSAIDS secondary to erosions due to NSAIDS.     Current Outpatient Prescriptions:  .  ALPRAZolam (XANAX) 0.5 MG tablet, TAKE 1 TABLET BY MOUTH EVERY 8 HOURS AS NEEDED, Disp: 30 tablet, Rfl: 5 .  amoxicillin-clavulanate (AUGMENTIN) 875-125 MG tablet, Take 1 tablet by mouth 2 (two) times daily., Disp: 20 tablet, Rfl: 0 .  calcium carbonate (OS-CAL - DOSED IN MG OF ELEMENTAL CALCIUM) 1250 (500 Ca) MG tablet, Take 1 tablet by mouth daily with breakfast., Disp: , Rfl:  .  Cholecalciferol 1000 UNITS capsule, Take 500 Units by mouth daily. , Disp: , Rfl:  .  Cinnamon 500 MG capsule, Take 500 mg by mouth daily., Disp: , Rfl:  .  COLLAGEN PO, Take 1 tablet by mouth., Disp: , Rfl:  .  cyanocobalamin 1000 MCG tablet, Take 1 tablet by mouth daily., Disp: , Rfl:  .   diphenhydrAMINE (BENADRYL) 25 MG tablet, Take 25 mg by mouth every 6 (six) hours as needed., Disp: , Rfl:  .  estradiol (ESTRACE) 0.1 MG/GM vaginal cream, Place 1 Applicatorful vaginally at bedtime., Disp: 42.5 g, Rfl: 12 .  hydrOXYzine (ATARAX/VISTARIL) 25 MG tablet, TAKE 1 TABLET (25 MG TOTAL) BY MOUTH EVERY 8 (EIGHT) HOURS AS NEEDED (FOR HIVES/ITCHING)., Disp: 30 tablet, Rfl: 3 .  Lactobacillus (PROBIOTIC ACIDOPHILUS PO), Take 1 capsule by mouth daily., Disp: , Rfl:  .  levocetirizine (XYZAL) 5 MG tablet, TAKE 1 TABLET EVERY DAY, Disp: 90 tablet, Rfl: 4 .  Melatonin 10 MG CAPS, Take 10 mg by mouth at bedtime., Disp: , Rfl:  .  montelukast (SINGULAIR) 10 MG tablet, TAKE 1 TABLET BY MOUTH DAILY, Disp: 30 tablet, Rfl: 6 .  Multiple Vitamins-Minerals (OCUVITE ADULT 50+) CAPS, Take 1 capsule by mouth daily. Reported on 03/17/2016, Disp: , Rfl:  .  pravastatin (PRAVACHOL) 20 MG tablet, TAKE 1 TABLET BY MOUTH AT BEDTIME, Disp: 30 tablet, Rfl: 5 .  ramipril (ALTACE) 2.5 MG capsule, TAKE 1 CAPSULE BY MOUTH EVERY DAY, Disp: 30 capsule, Rfl: 5  Review of Systems  Constitutional: Positive for fatigue. Negative for activity change and appetite change.  Respiratory: Negative for cough, chest  tightness, shortness of breath and wheezing.   Cardiovascular: Negative for chest pain, palpitations and leg swelling.  Gastrointestinal: Negative for abdominal distention, abdominal pain, constipation, diarrhea and nausea.  Genitourinary: Negative.   Musculoskeletal: Positive for arthralgias and myalgias.  Neurological: Negative.   Psychiatric/Behavioral: Negative.     Social History  Substance Use Topics  . Smoking status: Never Smoker  . Smokeless tobacco: Never Used  . Alcohol use No   Objective:   BP (!) 150/80 (BP Location: Right Arm, Patient Position: Sitting, Cuff Size: Large)   Pulse 65   Temp 98 F (36.7 C) (Oral)   Resp 16   Wt 199 lb (90.3 kg)   BMI 32.12 kg/m   Physical Exam    Constitutional: She appears well-developed and well-nourished. No distress.  Neck: Normal range of motion. Neck supple. No tracheal deviation present. No thyromegaly present.  Cardiovascular: Normal rate, regular rhythm and normal heart sounds.  Exam reveals no gallop and no friction rub.   No murmur heard. Pulmonary/Chest: Effort normal and breath sounds normal. No respiratory distress. She has no wheezes. She has no rales.  Musculoskeletal: She exhibits no edema.  Lymphadenopathy:    She has no cervical adenopathy.  Skin: She is not diaphoretic.  Psychiatric: She has a normal mood and affect. Her behavior is normal. Judgment and thought content normal.  Vitals reviewed.     Assessment & Plan:     1. Myalgia due to statin I do suspect that her myalgias and cramping may be secondary to her statin medication. She has been on pravastatin a long time. I will add CoQ10 for her to start to see if any benefit is noted. If not, she is to discontinue the statin but continue the CoQ10 for next 2 weeks. I will see her back in 4 weeks to see how she is doing. If there has been no improvement may consider adding Prozac or Cymbalta for the myalgia. I favor more toward Prozac since she is also having some binge eating habits late in the evening after work. She agrees. I will also give her flexeril for cramping. Drowsiness precautions given to patient and she verbalized understanding.  - Coenzyme Q10 (COQ10) 200 MG CAPS; Take 1 capsule by mouth daily.  Dispense: 30 capsule; Refill: 0  2. Muscle cramping See above medical treatment plan. - Coenzyme Q10 (COQ10) 200 MG CAPS; Take 1 capsule by mouth daily.  Dispense: 30 capsule; Refill: 0 - cyclobenzaprine (FLEXERIL) 5 MG tablet; Take 1-2 tablets (5-10 mg total) by mouth at bedtime.  Dispense: 60 tablet; Refill: 1  3. Psoriasis Stable. Diagnosis pulled for medication refill. Continue current medical treatment plan. - triamcinolone cream (KENALOG) 0.1 %;  Apply 1 application topically 2 (two) times daily.  Dispense: 30 g; Refill: 0  4. Generalized anxiety disorder Stable. Diagnosis pulled for medication refill. Continue current medical treatment plan. - ALPRAZolam (XANAX) 0.5 MG tablet; Take 1 tablet (0.5 mg total) by mouth every 8 (eight) hours as needed.  Dispense: 30 tablet; Refill: [place], PA-C  [facility]

## 2016-12-15 NOTE — Patient Instructions (Signed)
CoQ10 x 2 weeks, if no improvement then stop coQ10 and pravastatin x 2 weeks. Cyclobenzaprine 1-2 tabs at bedtime, may take a half to one tablet during the day if needed. I will see you back in 4 weeks.

## 2017-01-12 ENCOUNTER — Ambulatory Visit: Payer: 59 | Admitting: Physician Assistant

## 2017-01-17 ENCOUNTER — Encounter: Payer: Self-pay | Admitting: Physician Assistant

## 2017-01-17 ENCOUNTER — Ambulatory Visit (INDEPENDENT_AMBULATORY_CARE_PROVIDER_SITE_OTHER): Payer: 59 | Admitting: Physician Assistant

## 2017-01-17 DIAGNOSIS — R252 Cramp and spasm: Secondary | ICD-10-CM

## 2017-01-17 MED ORDER — CYCLOBENZAPRINE HCL 5 MG PO TABS: 5.0000 mg | ORAL_TABLET | Freq: Every day | ORAL | 5 refills | Status: DC

## 2017-01-17 NOTE — Progress Notes (Signed)
Patient: Virginia Weaver Female    DOB: 09-Jan-1951   66 y.o.   MRN: [id number] Visit Date: 01/17/2017  Today's Provider: [name], PA-C   Chief Complaint  Patient presents with  . Follow-up    myalgia and cramping   Subjective:    HPI  Follow up for myalgia and cramping  The patient was last seen for this on 12/15/16. Changes made at last visit include started on flexeril and CoQ10.  She reports excellent compliance with treatment. She feels that condition is Improved. She is not having side effects.  Still having some cramping in her hands and feet, but it is not as frequent as previously. She feels this is tolerable. She also has known plantar fasciitis bilaterally. She has seen podiatry in the past and had cortisone injections. She is wearing good support shoes, continues to do stretches and icing. She is going to see a chiropractor to see if this may help now. ------------------------------------------------------------------------------------     Allergies  Allergen Reactions  . Ibuprofen     Other reaction(s): Abdominal Pain Can't take NSAIDS secondary to erosions due to NSAIDS.  Marland Kitchen Nsaids     Can't take NSAIDS secondary to erosions due to NSAIDS.     Current Outpatient Prescriptions:  .  ALPRAZolam (XANAX) 0.5 MG tablet, Take 1 tablet (0.5 mg total) by mouth every 8 (eight) hours as needed., Disp: 30 tablet, Rfl: 5 .  calcium carbonate (OS-CAL - DOSED IN MG OF ELEMENTAL CALCIUM) 1250 (500 Ca) MG tablet, Take 1 tablet by mouth daily with breakfast., Disp: , Rfl:  .  Cholecalciferol 1000 UNITS capsule, Take 500 Units by mouth daily. , Disp: , Rfl:  .  Cinnamon 500 MG capsule, Take 500 mg by mouth daily., Disp: , Rfl:  .  Coenzyme Q10 (COQ10) 200 MG CAPS, Take 1 capsule by mouth daily., Disp: 30 capsule, Rfl: 0 .  COLLAGEN PO, Take 1 tablet by mouth., Disp: , Rfl:  .  cyanocobalamin 1000 MCG tablet, Take 1 tablet by mouth daily., Disp: , Rfl:  .   cyclobenzaprine (FLEXERIL) 5 MG tablet, Take 1-2 tablets (5-10 mg total) by mouth at bedtime., Disp: 60 tablet, Rfl: 1 .  estradiol (ESTRACE) 0.1 MG/GM vaginal cream, Place 1 Applicatorful vaginally at bedtime., Disp: 42.5 g, Rfl: 12 .  hydrOXYzine (ATARAX/VISTARIL) 25 MG tablet, TAKE 1 TABLET (25 MG TOTAL) BY MOUTH EVERY 8 (EIGHT) HOURS AS NEEDED (FOR HIVES/ITCHING)., Disp: 30 tablet, Rfl: 3 .  Lactobacillus (PROBIOTIC ACIDOPHILUS PO), Take 1 capsule by mouth daily., Disp: , Rfl:  .  levocetirizine (XYZAL) 5 MG tablet, TAKE 1 TABLET EVERY DAY, Disp: 90 tablet, Rfl: 4 .  Melatonin 10 MG CAPS, Take 10 mg by mouth at bedtime., Disp: , Rfl:  .  montelukast (SINGULAIR) 10 MG tablet, TAKE 1 TABLET BY MOUTH DAILY, Disp: 30 tablet, Rfl: 6 .  pravastatin (PRAVACHOL) 20 MG tablet, TAKE 1 TABLET BY MOUTH AT BEDTIME, Disp: 30 tablet, Rfl: 5 .  ramipril (ALTACE) 2.5 MG capsule, TAKE 1 CAPSULE BY MOUTH EVERY DAY, Disp: 30 capsule, Rfl: 5 .  triamcinolone cream (KENALOG) 0.1 %, Apply 1 application topically 2 (two) times daily., Disp: 30 g, Rfl: 0  Review of Systems  Constitutional: Negative.   Respiratory: Negative.   Cardiovascular: Negative.   Musculoskeletal: Positive for myalgias.  Neurological: Negative.     Social History  Substance Use Topics  . Smoking status: Never Smoker  . Smokeless tobacco:  Never Used  . Alcohol use No   Objective:   BP 100/66 (BP Location: Left Arm, Patient Position: Sitting, Cuff Size: Large)   Pulse 72   Temp 98.2 F (36.8 C) (Oral)   Resp 16   Ht 5\' 6"  (1.676 m)   Wt 204 lb 3.2 oz (92.6 kg)   SpO2 99%   BMI 32.96 kg/m  Vitals:   01/17/17 0900  BP: 100/66  Pulse: 72  Resp: 16  Temp: 98.2 F (36.8 C)  TempSrc: Oral  SpO2: 99%  Weight: 204 lb 3.2 oz (92.6 kg)  Height: 5\' 6"  (1.676 m)     Physical Exam  Constitutional: She appears well-developed and well-nourished. No distress.  Neck: Normal range of motion. Neck supple.  Cardiovascular: Normal  rate, regular rhythm and normal heart sounds.  Exam reveals no gallop and no friction rub.   No murmur heard. Pulmonary/Chest: Effort normal and breath sounds normal. No respiratory distress. She has no wheezes. She has no rales.  Musculoskeletal: She exhibits no edema.  Skin: She is not diaphoretic.  Psychiatric: She has a normal mood and affect. Her behavior is normal. Judgment and thought content normal.  Vitals reviewed.      Assessment & Plan:     1. Muscle cramping Improved. Patient is wanting to continue current therapy with flexeril at bedtime, CoQ10 and continuing pravastatin 20mg  at this time. If myalgias worsen again we will discontinue pravastatin. She is scheduled for CPE in May 2018. - cyclobenzaprine (FLEXERIL) 5 MG tablet; Take 1-2 tablets (5-10 mg total) by mouth at bedtime.  Dispense: 60 tablet; Refill: [place], PA-C  [facility]

## 2017-01-17 NOTE — Patient Instructions (Signed)
Muscle Pain, Adult Muscle pain (myalgia) may be mild or severe. In most cases, the pain lasts only a short time and it goes away without treatment. It is normal to feel some muscle pain after starting a workout program. Muscles that have not been used often will be sore at first. Muscle pain may also be caused by many other things, including:  Overuse or muscle strain, especially if you are not in shape. This is the most common cause of muscle pain.  Injury.  Bruises.  Viruses, such as the flu.  Infectious diseases.  A chronic condition that causes muscle tenderness, fatigue, and headache (fibromyalgia).  A condition, such as lupus, in which the body's disease-fighting system attacks other organs in the body (autoimmune or rheumatologic diseases).  Certain drugs, including ACE inhibitors and statins. To diagnose the cause of your muscle pain, your health care provider will do a physical exam and ask questions about the pain and when it began. If you have not had muscle pain for very long, your health care provider may want to wait before doing much testing. If your muscle pain has lasted a long time, your health care provider may want to run tests right away. In some cases, this may include tests to rule out certain conditions or illnesses. Treatment for muscle pain depends on the cause. Home care is often enough to relieve muscle pain. Your health care provider may also prescribe anti-inflammatory medicine. Follow these instructions at home: Activity   If overuse is causing your muscle pain:  Slow down your activities until the pain goes away.  Do regular, gentle exercises if you are not usually active.  Warm up before exercising. Stretch before and after exercising. This can help lower the risk of muscle pain.  Do not continue working out if the pain is very bad. Bad pain could mean that you have injured a muscle. Managing pain and discomfort    If directed, apply ice to the  sore muscle:  Put ice in a plastic bag.  Place a towel between your skin and the bag.  Leave the ice on for 20 minutes, 2-3 times a day.  You may also alternate between applying ice and applying heat as told by your health care provider. To apply heat, use the heat source that your health care provider recommends, such as a moist heat pack or a heating pad.  Place a towel between your skin and the heat source.  Leave the heat on for 20-30 minutes.  Remove the heat if your skin turns bright red. This is especially important if you are unable to feel pain, heat, or cold. You may have a greater risk of getting burned. Medicines   Take over-the-counter and prescription medicines only as told by your health care provider.  Do not drive or use heavy machinery while taking prescription pain medicine. Contact a health care provider if:  Your muscle pain gets worse and medicines do not help.  You have muscle pain that lasts longer than 3 days.  You have a rash or fever along with muscle pain.  You have muscle pain after a tick bite.  You have muscle pain while working out, even though you are in good physical condition.  You have redness, soreness, or swelling along with muscle pain.  You have muscle pain after starting a new medicine or changing the dose of a medicine. Get help right away if:  You have trouble breathing.  You have trouble swallowing.  You have muscle pain along with a stiff neck, fever, and vomiting.  You have severe muscle weakness or cannot move part of your body. This information is not intended to replace advice given to you by your health care provider. Make sure you discuss any questions you have with your health care provider. Document Released: 09/01/2006 Document Revised: 04/29/2016 Document Reviewed: 03/01/2016 Elsevier Interactive Patient Education  2017 [employer].

## 2017-01-26 ENCOUNTER — Ambulatory Visit (INDEPENDENT_AMBULATORY_CARE_PROVIDER_SITE_OTHER): Payer: 59 | Admitting: Family Medicine

## 2017-01-26 ENCOUNTER — Encounter: Payer: Self-pay | Admitting: Family Medicine

## 2017-01-26 ENCOUNTER — Ambulatory Visit: Payer: Self-pay | Admitting: Family Medicine

## 2017-01-26 VITALS — BP 130/72 | HR 88 | Temp 97.8°F | Resp 16 | Wt 203.2 lb

## 2017-01-26 DIAGNOSIS — B9789 Other viral agents as the cause of diseases classified elsewhere: Secondary | ICD-10-CM | POA: Diagnosis not present

## 2017-01-26 DIAGNOSIS — J069 Acute upper respiratory infection, unspecified: Secondary | ICD-10-CM

## 2017-01-26 MED ORDER — AMOXICILLIN-POT CLAVULANATE 875-125 MG PO TABS: 1.0000 | ORAL_TABLET | Freq: Two times a day (BID) | ORAL | 0 refills | Status: DC

## 2017-01-26 MED ORDER — HYDROCODONE-HOMATROPINE 5-1.5 MG/5ML PO SYRP: ORAL_SOLUTION | ORAL | 0 refills | Status: DC

## 2017-01-26 NOTE — Patient Instructions (Signed)
Continue Sudafed and expectorant. If sinuses not improving start the antibiotic

## 2017-01-26 NOTE — Progress Notes (Signed)
Subjective:     Patient ID: TREZURE CRONK, female   DOB: 1951/04/18, 66 y.o.   MRN: [id number]  HPI  Chief Complaint  Patient presents with  . Cough    Patient comes in office today with concerns of cough and congestion for over a week. Associated with cough patient complains of aches, runny nose, congestion sinus pain/pressire, ear pressure, shortness of breath and wheezing. Patient has been taking otc Sudafed.   Reports sx onset on 3/28 with scratchy throat. Patient reports increased sinus pressure, purulent sinus drainage, post nasal drainage and accompanying cough   Review of Systems     Objective:   Physical Exam  Constitutional: She appears well-developed and well-nourished. No distress.  Ears: T.M's intact without inflammation Sinuses: moderate maxillary sinus tenderness Throat: no tonsillar enlargement or exudate Neck: no cervical adenopathy Lungs: clear     Assessment:    1. Viral upper respiratory tract infection - HYDROcodone-homatropine (HYCODAN) 5-1.5 MG/5ML syrup; 5 ml 4-6 hours as needed for cough  Dispense: 240 mL; Refill: 0 - amoxicillin-clavulanate (AUGMENTIN) 875-125 MG tablet; Take 1 tablet by mouth 2 (two) times daily.  Dispense: 20 tablet; Refill: 0    Plan:    Continue Sudafed and expectorant. Add abx if sinuses not improving in the next day or two.

## 2017-03-10 ENCOUNTER — Other Ambulatory Visit: Payer: Self-pay | Admitting: Physician Assistant

## 2017-03-10 DIAGNOSIS — I10 Essential (primary) hypertension: Secondary | ICD-10-CM

## 2017-03-22 ENCOUNTER — Ambulatory Visit (INDEPENDENT_AMBULATORY_CARE_PROVIDER_SITE_OTHER): Payer: 59 | Admitting: Physician Assistant

## 2017-03-22 ENCOUNTER — Encounter: Payer: Self-pay | Admitting: Physician Assistant

## 2017-03-22 VITALS — BP 138/70 | HR 73 | Temp 98.1°F | Resp 16 | Ht 64.0 in | Wt 206.2 lb

## 2017-03-22 DIAGNOSIS — Z6835 Body mass index (BMI) 35.0-35.9, adult: Secondary | ICD-10-CM

## 2017-03-22 DIAGNOSIS — I10 Essential (primary) hypertension: Secondary | ICD-10-CM | POA: Diagnosis not present

## 2017-03-22 DIAGNOSIS — Z1231 Encounter for screening mammogram for malignant neoplasm of breast: Secondary | ICD-10-CM

## 2017-03-22 DIAGNOSIS — R5382 Chronic fatigue, unspecified: Secondary | ICD-10-CM

## 2017-03-22 DIAGNOSIS — Z0000 Encounter for general adult medical examination without abnormal findings: Secondary | ICD-10-CM | POA: Diagnosis not present

## 2017-03-22 DIAGNOSIS — Z8249 Family history of ischemic heart disease and other diseases of the circulatory system: Secondary | ICD-10-CM

## 2017-03-22 DIAGNOSIS — E7800 Pure hypercholesterolemia, unspecified: Secondary | ICD-10-CM | POA: Diagnosis not present

## 2017-03-22 DIAGNOSIS — F321 Major depressive disorder, single episode, moderate: Secondary | ICD-10-CM

## 2017-03-22 DIAGNOSIS — Z114 Encounter for screening for human immunodeficiency virus [HIV]: Secondary | ICD-10-CM

## 2017-03-22 DIAGNOSIS — R252 Cramp and spasm: Secondary | ICD-10-CM

## 2017-03-22 DIAGNOSIS — Z1239 Encounter for other screening for malignant neoplasm of breast: Secondary | ICD-10-CM

## 2017-03-22 NOTE — Progress Notes (Signed)
Patient: Virginia Weaver, Female    DOB: [birth year], 66 y.o.   MRN: [id number] Visit Date: 03/22/2017  Today's Provider: [name], PA-C   Chief Complaint  Patient presents with  . Annual Exam   Subjective:    Annual physical exam Virginia Weaver is a 66 y.o. female who presents today for health maintenance and complete physical. She feels fairly well, hurting all over still.  She reports exercising none, but she is a CNA/med Tech and walks daily. She reports she is sleeping well.  Last CPE:03/17/16 Mammogram:03/21/12 BI-RADS 2 Pap;03/16/15 Negative, HPV-Negative -----------------------------------------------------------------   Review of Systems  Constitutional: Positive for appetite change.  HENT: Positive for congestion, hearing loss, sinus pressure and tinnitus.   Eyes: Positive for visual disturbance ("Reading Glasses").  Respiratory: Negative.   Cardiovascular: Positive for leg swelling.  Gastrointestinal: Negative.   Endocrine: Negative.   Genitourinary: Positive for difficulty urinating, dysuria (at times) and frequency.  Musculoskeletal: Positive for arthralgias, back pain and neck pain.  Skin: Negative.   Allergic/Immunologic: Positive for environmental allergies.  Neurological: Positive for light-headedness.  Hematological: Bruises/bleeds easily.    Social History      She  reports that she has never smoked. She has never used smokeless tobacco. She reports that she does not drink alcohol or use drugs.       Social History   Social History  . Marital status: Widowed    Spouse name: N/A  . Number of children: 2  . Years of education: H/S   Occupational History  . [employer] [place] History Main Topics  . Smoking status: Never Smoker  . Smokeless tobacco: Never Used  . Alcohol use No  . Drug use: No  . Sexual activity: Not Asked   Other Topics Concern  . None   Social History Narrative  . None     Past Medical History:  Diagnosis Date  . Acid reflux 04/02/2001  . Adiposity 04/20/1993  . Benign essential HTN 11/11/1993  . Diabetes mellitus without complication (HCC)    diet   . Heart murmur   . Hypertension   . Syncope and collapse 01/20/2009  . Tinnitus    left ear  . Urticaria 06/04/2015     Patient Active Problem List   Diagnosis Date Noted  . B12 deficiency 03/17/2016  . Allergic rhinitis 02/18/2016  . Urticaria 06/04/2015  . Anxiety 04/29/2015  . Depression, major, single episode, moderate ([place]) 04/29/2015  . Bilateral tinnitus 04/29/2015  . Avitaminosis D 04/29/2015  . Hypertension 03/30/2015  . Hypercholesteremia 03/30/2015  . Cardiovascular symptoms 01/12/2010  . Syncope and collapse 01/20/2009  . Fam hx-ischem heart disease 11/25/2008  . Acid reflux 04/02/2001  . Benign essential HTN 11/11/1993  . Adiposity 04/20/1993    Past Surgical History:  Procedure Laterality Date  . CYSTOSCOPY WITH BIOPSY N/A 12/22/2015   Procedure: CYSTOSCOPY WITH BIOPSY;  Surgeon: [name], MD;  Location: [facility] ORS;  Service: Urology;  Laterality: N/A;  . TUBAL LIGATION  1987  . TUMMY TUCK  06/17/2010    Family History        Family Status  Relation Status  . Mother Deceased at age 68       07/11/13  . Father Deceased       Recently past away 03/2015 secondary to MVA.   Marland Kitchen Sister Alive  . MGM Deceased  . MGF Deceased  . PGM Deceased  .  PGF Deceased  . Daughter Alive  . Son Alive  . Sister Alive        Her family history includes AAA (abdominal aortic aneurysm) in her father; CVA in her paternal grandmother; Congestive Heart Failure in her paternal grandmother; Diabetes in her mother; Glaucoma in her father; Heart attack in her father, maternal grandfather, mother, and paternal grandfather; Hypertension in her father; Obesity in her sister; Stroke in her mother; Throat cancer in her maternal grandmother and mother.     Allergies  Allergen Reactions  . Ibuprofen      Other reaction(s): Abdominal Pain Can't take NSAIDS secondary to erosions due to NSAIDS.  Marland Kitchen Nsaids     Can't take NSAIDS secondary to erosions due to NSAIDS.     Current Outpatient Prescriptions:  .  ALPRAZolam (XANAX) 0.5 MG tablet, Take 1 tablet (0.5 mg total) by mouth every 8 (eight) hours as needed., Disp: 30 tablet, Rfl: 5 .  calcium carbonate (OS-CAL - DOSED IN MG OF ELEMENTAL CALCIUM) 1250 (500 Ca) MG tablet, Take 1 tablet by mouth daily with breakfast., Disp: , Rfl:  .  Cholecalciferol 1000 UNITS capsule, Take 500 Units by mouth daily. , Disp: , Rfl:  .  Cinnamon 500 MG capsule, Take 500 mg by mouth daily., Disp: , Rfl:  .  Coenzyme Q10 (COQ10) 200 MG CAPS, Take 1 capsule by mouth daily., Disp: 30 capsule, Rfl: 0 .  COLLAGEN PO, Take 1 tablet by mouth., Disp: , Rfl:  .  cyanocobalamin 1000 MCG tablet, Take 1 tablet by mouth daily., Disp: , Rfl:  .  cyclobenzaprine (FLEXERIL) 5 MG tablet, Take 1-2 tablets (5-10 mg total) by mouth at bedtime., Disp: 60 tablet, Rfl: 5 .  estradiol (ESTRACE) 0.1 MG/GM vaginal cream, Place 1 Applicatorful vaginally at bedtime., Disp: 42.5 g, Rfl: 12 .  hydrOXYzine (ATARAX/VISTARIL) 25 MG tablet, TAKE 1 TABLET (25 MG TOTAL) BY MOUTH EVERY 8 (EIGHT) HOURS AS NEEDED (FOR HIVES/ITCHING)., Disp: 30 tablet, Rfl: 3 .  Lactobacillus (PROBIOTIC ACIDOPHILUS PO), Take 1 capsule by mouth daily., Disp: , Rfl:  .  levocetirizine (XYZAL) 5 MG tablet, TAKE 1 TABLET EVERY DAY, Disp: 90 tablet, Rfl: 4 .  Melatonin 10 MG CAPS, Take 10 mg by mouth at bedtime., Disp: , Rfl:  .  montelukast (SINGULAIR) 10 MG tablet, TAKE 1 TABLET BY MOUTH DAILY, Disp: 30 tablet, Rfl: 6 .  pravastatin (PRAVACHOL) 20 MG tablet, TAKE 1 TABLET BY MOUTH AT BEDTIME, Disp: 30 tablet, Rfl: 5 .  ramipril (ALTACE) 2.5 MG capsule, TAKE 1 CAPSULE BY MOUTH EVERY DAY, Disp: 90 capsule, Rfl: 1 .  triamcinolone cream (KENALOG) 0.1 %, Apply 1 application topically 2 (two) times daily., Disp: 30 g, Rfl: 0 .   amoxicillin-clavulanate (AUGMENTIN) 875-125 MG tablet, Take 1 tablet by mouth 2 (two) times daily. (Patient not taking: Reported on 03/22/2017), Disp: 20 tablet, Rfl: 0 .  HYDROcodone-homatropine (HYCODAN) 5-1.5 MG/5ML syrup, 5 ml 4-6 hours as needed for cough (Patient not taking: Reported on 03/22/2017), Disp: 240 mL, Rfl: 0   Patient Care Team: [name], PA-C as PCP - General (Family Medicine)      Objective:   Vitals: BP 138/70 (BP Location: Right Arm, Patient Position: Sitting, Cuff Size: Normal)   Pulse 73   Temp 98.1 F (36.7 C) (Oral)   Resp 16   Ht _0  (1.626 m)   Wt 206 lb 3.2 oz (93.5 kg)   BMI 35.39 kg/m    Vitals:  03/22/17 0930  BP: 138/70  Pulse: 73  Resp: 16  Temp: 98.1 F (36.7 C)  TempSrc: Oral  Weight: 206 lb 3.2 oz (93.5 kg)  Height: 5\' 4"  (1.626 m)     Physical Exam  Constitutional: She is oriented to person, place, and time. She appears well-developed and well-nourished. No distress.  HENT:  Head: Normocephalic and atraumatic.  Right Ear: Hearing, tympanic membrane, external ear and ear canal normal.  Left Ear: Hearing, tympanic membrane, external ear and ear canal normal.  Nose: Nose normal.  Mouth/Throat: Uvula is midline, oropharynx is clear and moist and mucous membranes are normal. No oropharyngeal exudate.  Eyes: Conjunctivae and EOM are normal. Pupils are equal, round, and reactive to light. Right eye exhibits no discharge. Left eye exhibits no discharge. No scleral icterus.  Neck: Normal range of motion. Neck supple. No JVD present. Carotid bruit is not present. No tracheal deviation present. No thyromegaly present.  Cardiovascular: Normal rate, regular rhythm, normal heart sounds and intact distal pulses.  Exam reveals no gallop and no friction rub.   No murmur heard. Pulmonary/Chest: Effort normal and breath sounds normal. No respiratory distress. She has no wheezes. She has no rales. She exhibits no tenderness. Right breast  exhibits no inverted nipple, no mass, no nipple discharge, no skin change and no tenderness. Left breast exhibits no inverted nipple, no mass, no nipple discharge, no skin change and no tenderness. Breasts are symmetrical.  Abdominal: Soft. Bowel sounds are normal. She exhibits no distension and no mass. There is no tenderness. There is no rebound and no guarding.  Musculoskeletal: Normal range of motion. She exhibits no edema or tenderness.  Lymphadenopathy:    She has no cervical adenopathy.  Neurological: She is alert and oriented to person, place, and time.  Skin: Skin is warm and dry. No rash noted. She is not diaphoretic.  Psychiatric: She has a normal mood and affect. Her behavior is normal. Judgment and thought content normal.  Vitals reviewed.    Depression Screen PHQ 2/9 Scores 03/22/2017 08/02/2016  PHQ - 2 Score 0 0  PHQ- 9 Score 6 -      Assessment & Plan:     Routine Health Maintenance and Physical Exam  Exercise Activities and Dietary recommendations Goals    None      Immunization History  Administered Date(s) Administered  . Td 10/21/2004  . Tdap 01/12/2010  . Zoster 10/09/2012    Health Maintenance  Topic Date Due  . PNEUMOCOCCAL POLYSACCHARIDE VACCINE (1) 08/16/1955  . FOOT EXAM  08/16/1963  . OPHTHALMOLOGY EXAM  08/16/1963  . HIV Screening  08/15/1968  . MAMMOGRAM  08/16/2003  . HEMOGLOBIN A1C  01/31/2017  . INFLUENZA VACCINE  07/20/2017 (Originally 05/24/2017)  . PAP SMEAR  03/17/2018  . COLONOSCOPY  02/18/2019  . TETANUS/TDAP  01/13/2020  . Hepatitis C Screening  Completed     Discussed health benefits of physical activity, and encouraged her to engage in regular exercise appropriate for her age and condition.    1. Annual physical exam Normal physical exam today. Will check labs as below and f/u pending lab results. If labs are stable and WNL she will not need to have these rechecked for one year at her next annual physical exam. She is to  call the office in the meantime if she has any acute issue, questions or concerns. - TSH  2. Breast cancer screening Breast exam today was normal. There is no family history of breast cancer.  She does perform regular self breast exams. Mammogram was ordered as below. Information for [facility] was given to patient so she may schedule her mammogram at her convenience. - MM DIGITAL SCREENING BILATERAL; Future  3. Hypercholesteremia Stable. Continue pravastatin 90m currently. Will check CK levels to make sure myalgias are not secondary to statin use. Also of note, patient did do a trial off statins for 2 weeks without any change in symptoms thus she restarted. She is using CoQ10 and this has helped some, but patient still has myalgias and arthralgias.  - CBC with Differential/Platelet - Comprehensive metabolic panel - Hemoglobin A1c - Lipid panel  4. Essential hypertension Stable. Continue Ramipril 2.585m Will check labs as below and f/u pending results. - CBC with Differential/Platelet - Comprehensive metabolic panel - Hemoglobin A1c  5. Fam hx-ischem heart disease Will check labs as below and f/u pending results. - Comprehensive metabolic panel - Hemoglobin A1c - Lipid panel  6. Class 2 severe obesity due to excess calories with serious comorbidity and body mass index (BMI) of 35.0 to 35.9 in adult ([facility] on healthy lifestyle modifications including dieting and exercise.  - Comprehensive metabolic panel - Hemoglobin A1c - Lipid panel  7. Depression, major, single episode, moderate (HCC) Stable. Uses Xanax prn.   8. Muscle cramping Will check labs as below and f/u pending results. - CK (Creatine Kinase) - Sed Rate (ESR) - C-reactive protein - Rheumatoid Factor - ANA w/Reflex if Positive  9. Chronic fatigue Will check labs as below and f/u pending results. - CBC with Differential/Platelet - CK (Creatine Kinase) - Sed Rate (ESR) - C-reactive  protein - Rheumatoid Factor - ANA w/Reflex if Positive  10. Screening for HIV without presence of risk factors - HIV antibody   *Addend: Lab results were normal with exception of mildly elevated RF. Rheumatology referral placed. --------------------------------------------------------------------    JeMar DaringPA-C  [place]

## 2017-03-22 NOTE — Patient Instructions (Addendum)
Eye doctor appt Call Dr. Nehemiah Massed for cardiology appt Call [facility]i for mammogram [phone]   Health Maintenance for Postmenopausal Women Menopause is a normal process in which your reproductive ability comes to an end. This process happens gradually over a span of months to years, usually between the ages of 58 and 84. Menopause is complete when you have missed 12 consecutive menstrual periods. It is important to talk with your health care provider about some of the most common conditions that affect postmenopausal women, such as heart disease, cancer, and bone loss (osteoporosis). Adopting a healthy lifestyle and getting preventive care can help to promote your health and wellness. Those actions can also lower your chances of developing some of these common conditions. What should I know about menopause? During menopause, you may experience a number of symptoms, such as:  Moderate-to-severe hot flashes.  Night sweats.  Decrease in sex drive.  Mood swings.  Headaches.  Tiredness.  Irritability.  Memory problems.  Insomnia. Choosing to treat or not to treat menopausal changes is an individual decision that you make with your health care provider. What should I know about hormone replacement therapy and supplements? Hormone therapy products are effective for treating symptoms that are associated with menopause, such as hot flashes and night sweats. Hormone replacement carries certain risks, especially as you become older. If you are thinking about using estrogen or estrogen with progestin treatments, discuss the benefits and risks with your health care provider. What should I know about heart disease and stroke? Heart disease, heart attack, and stroke become more likely as you age. This may be due, in part, to the hormonal changes that your body experiences during menopause. These can affect how your body processes dietary fats, triglycerides, and cholesterol. Heart attack and  stroke are both medical emergencies. There are many things that you can do to help prevent heart disease and stroke:  Have your blood pressure checked at least every 1-2 years. High blood pressure causes heart disease and increases the risk of stroke.  If you are 80-7 years old, ask your health care provider if you should take aspirin to prevent a heart attack or a stroke.  Do not use any tobacco products, including cigarettes, chewing tobacco, or electronic cigarettes. If you need help quitting, ask your health care provider.  It is important to eat a healthy diet and maintain a healthy weight.  Be sure to include plenty of vegetables, fruits, low-fat dairy products, and lean protein.  Avoid eating foods that are high in solid fats, added sugars, or salt (sodium).  Get regular exercise. This is one of the most important things that you can do for your health.  Try to exercise for at least 150 minutes each week. The type of exercise that you do should increase your heart rate and make you sweat. This is known as moderate-intensity exercise.  Try to do strengthening exercises at least twice each week. Do these in addition to the moderate-intensity exercise.  Know your numbers.Ask your health care provider to check your cholesterol and your blood glucose. Continue to have your blood tested as directed by your health care provider. What should I know about cancer screening? There are several types of cancer. Take the following steps to reduce your risk and to catch any cancer development as early as possible. Breast Cancer  Practice breast self-awareness.  This means understanding how your breasts normally appear and feel.  It also means doing regular breast self-exams. Let your health care  provider know about any changes, no matter how small.  If you are 57 or older, have a clinician do a breast exam (clinical breast exam or CBE) every year. Depending on your age, family history, and  medical history, it may be recommended that you also have a yearly breast X-ray (mammogram).  If you have a family history of breast cancer, talk with your health care provider about genetic screening.  If you are at high risk for breast cancer, talk with your health care provider about having an MRI and a mammogram every year.  Breast cancer (BRCA) gene test is recommended for women who have family members with BRCA-related cancers. Results of the assessment will determine the need for genetic counseling and BRCA1 and for BRCA2 testing. BRCA-related cancers include these types:  Breast. This occurs in males or females.  Ovarian.  Tubal. This may also be called fallopian tube cancer.  Cancer of the abdominal or pelvic lining (peritoneal cancer).  Prostate.  Pancreatic. Cervical, Uterine, and Ovarian Cancer  Your health care provider may recommend that you be screened regularly for cancer of the pelvic organs. These include your ovaries, uterus, and vagina. This screening involves a pelvic exam, which includes checking for microscopic changes to the surface of your cervix (Pap test).  For women ages 21-65, health care providers may recommend a pelvic exam and a Pap test every three years. For women ages 77-65, they may recommend the Pap test and pelvic exam, combined with testing for human papilloma virus (HPV), every five years. Some types of HPV increase your risk of cervical cancer. Testing for HPV may also be done on women of any age who have unclear Pap test results.  Other health care providers may not recommend any screening for nonpregnant women who are considered low risk for pelvic cancer and have no symptoms. Ask your health care provider if a screening pelvic exam is right for you.  If you have had past treatment for cervical cancer or a condition that could lead to cancer, you need Pap tests and screening for cancer for at least 20 years after your treatment. If Pap tests have  been discontinued for you, your risk factors (such as having a new sexual partner) need to be reassessed to determine if you should start having screenings again. Some women have medical problems that increase the chance of getting cervical cancer. In these cases, your health care provider may recommend that you have screening and Pap tests more often.  If you have a family history of uterine cancer or ovarian cancer, talk with your health care provider about genetic screening.  If you have vaginal bleeding after reaching menopause, tell your health care provider.  There are currently no reliable tests available to screen for ovarian cancer. Lung Cancer  Lung cancer screening is recommended for adults 36-60 years old who are at high risk for lung cancer because of a history of smoking. A yearly low-dose CT scan of the lungs is recommended if you:  Currently smoke.  Have a history of at least 30 pack-years of smoking and you currently smoke or have quit within the past 15 years. A pack-year is smoking an average of one pack of cigarettes per day for one year. Yearly screening should:  Continue until it has been 15 years since you quit.  Stop if you develop a health problem that would prevent you from having lung cancer treatment. Colorectal Cancer  This type of cancer can be detected and  can often be prevented.  Routine colorectal cancer screening usually begins at age 33 and continues through age 74.  If you have risk factors for colon cancer, your health care provider may recommend that you be screened at an earlier age.  If you have a family history of colorectal cancer, talk with your health care provider about genetic screening.  Your health care provider may also recommend using home test kits to check for hidden blood in your stool.  A small camera at the end of a tube can be used to examine your colon directly (sigmoidoscopy or colonoscopy). This is done to check for the earliest  forms of colorectal cancer.  Direct examination of the colon should be repeated every 5-10 years until age 16. However, if early forms of precancerous polyps or small growths are found or if you have a family history or genetic risk for colorectal cancer, you may need to be screened more often. Skin Cancer  Check your skin from head to toe regularly.  Monitor any moles. Be sure to tell your health care provider:  About any new moles or changes in moles, especially if there is a change in a mole's shape or color.  If you have a mole that is larger than the size of a pencil eraser.  If any of your family members has a history of skin cancer, especially at a young age, talk with your health care provider about genetic screening.  Always use sunscreen. Apply sunscreen liberally and repeatedly throughout the day.  Whenever you are outside, protect yourself by wearing long sleeves, pants, a wide-brimmed hat, and sunglasses. What should I know about osteoporosis? Osteoporosis is a condition in which bone destruction happens more quickly than new bone creation. After menopause, you may be at an increased risk for osteoporosis. To help prevent osteoporosis or the bone fractures that can happen because of osteoporosis, the following is recommended:  If you are 62-50 years old, get at least 1,000 mg of calcium and at least 600 mg of vitamin D per day.  If you are older than age 89 but younger than age 69, get at least 1,200 mg of calcium and at least 600 mg of vitamin D per day.  If you are older than age 75, get at least 1,200 mg of calcium and at least 800 mg of vitamin D per day. Smoking and excessive alcohol intake increase the risk of osteoporosis. Eat foods that are rich in calcium and vitamin D, and do weight-bearing exercises several times each week as directed by your health care provider. What should I know about how menopause affects my mental health? Depression may occur at any age, but  it is more common as you become older. Common symptoms of depression include:  Low or sad mood.  Changes in sleep patterns.  Changes in appetite or eating patterns.  Feeling an overall lack of motivation or enjoyment of activities that you previously enjoyed.  Frequent crying spells. Talk with your health care provider if you think that you are experiencing depression. What should I know about immunizations? It is important that you get and maintain your immunizations. These include:  Tetanus, diphtheria, and pertussis (Tdap) booster vaccine.  Influenza every year before the flu season begins.  Pneumonia vaccine.  Shingles vaccine. Your health care provider may also recommend other immunizations. This information is not intended to replace advice given to you by your health care provider. Make sure you discuss any questions you have with  your health care provider. Document Released: 12/02/2005 Document Revised: 04/29/2016 Document Reviewed: 07/14/2015 Elsevier Interactive Patient Education  2017 [employer].

## 2017-03-23 ENCOUNTER — Telehealth: Payer: Self-pay

## 2017-03-23 DIAGNOSIS — R768 Other specified abnormal immunological findings in serum: Secondary | ICD-10-CM

## 2017-03-23 DIAGNOSIS — M791 Myalgia, unspecified site: Secondary | ICD-10-CM

## 2017-03-23 DIAGNOSIS — M2550 Pain in unspecified joint: Secondary | ICD-10-CM

## 2017-03-23 LAB — COMPREHENSIVE METABOLIC PANEL
ALK PHOS: 71 IU/L (ref 39–117)
ALT: 32 IU/L (ref 0–32)
AST: 38 IU/L (ref 0–40)
Albumin/Globulin Ratio: 1.8 (ref 1.2–2.2)
Albumin: 4.4 g/dL (ref 3.6–4.8)
BUN/Creatinine Ratio: 26 (ref 12–28)
BUN: 18 mg/dL (ref 8–27)
Bilirubin Total: 0.9 mg/dL (ref 0.0–1.2)
CO2: 25 mmol/L (ref 18–29)
CREATININE: 0.7 mg/dL (ref 0.57–1.00)
Calcium: 9.8 mg/dL (ref 8.7–10.3)
Chloride: 102 mmol/L (ref 96–106)
GFR calc Af Amer: 107 mL/min/{1.73_m2} (ref >59)
GFR, EST NON AFRICAN AMERICAN: 93 mL/min/{1.73_m2} (ref >59)
GLOBULIN, TOTAL: 2.4 g/dL (ref 1.5–4.5)
GLUCOSE: 147 mg/dL — AB (ref 65–99)
Potassium: 4.6 mmol/L (ref 3.5–5.2)
Sodium: 141 mmol/L (ref 134–144)
Total Protein: 6.8 g/dL (ref 6.0–8.5)

## 2017-03-23 LAB — CBC WITH DIFFERENTIAL/PLATELET
BASOS ABS: 0 10*3/uL (ref 0.0–0.2)
Basos: 1 %
EOS (ABSOLUTE): 0.1 10*3/uL (ref 0.0–0.4)
EOS: 3 %
HEMATOCRIT: 41.3 % (ref 34.0–46.6)
Hemoglobin: 14 g/dL (ref 11.1–15.9)
IMMATURE GRANULOCYTES: 0 %
Immature Grans (Abs): 0 10*3/uL (ref 0.0–0.1)
Lymphocytes Absolute: 1.1 10*3/uL (ref 0.7–3.1)
Lymphs: 34 %
MCH: 29.8 pg (ref 26.6–33.0)
MCHC: 33.9 g/dL (ref 31.5–35.7)
MCV: 88 fL (ref 79–97)
MONOCYTES: 10 %
MONOS ABS: 0.3 10*3/uL (ref 0.1–0.9)
NEUTROS PCT: 52 %
Neutrophils Absolute: 1.6 10*3/uL (ref 1.4–7.0)
PLATELETS: 182 10*3/uL (ref 150–379)
RBC: 4.7 x10E6/uL (ref 3.77–5.28)
RDW: 13.4 % (ref 12.3–15.4)
WBC: 3.1 10*3/uL — AB (ref 3.4–10.8)

## 2017-03-23 LAB — LIPID PANEL
Chol/HDL Ratio: 2.2 ratio (ref 0.0–4.4)
Cholesterol, Total: 184 mg/dL (ref 100–199)
HDL: 84 mg/dL (ref >39)
LDL Calculated: 90 mg/dL (ref 0–99)
TRIGLYCERIDES: 50 mg/dL (ref 0–149)
VLDL CHOLESTEROL CAL: 10 mg/dL (ref 5–40)

## 2017-03-23 LAB — HIV ANTIBODY (ROUTINE TESTING W REFLEX): HIV Screen 4th Generation wRfx: NONREACTIVE

## 2017-03-23 LAB — HEMOGLOBIN A1C
ESTIMATED AVERAGE GLUCOSE: 126 mg/dL
HEMOGLOBIN A1C: 6 % — AB (ref 4.8–5.6)

## 2017-03-23 LAB — SEDIMENTATION RATE: SED RATE: 6 mm/h (ref 0–40)

## 2017-03-23 LAB — ANA W/REFLEX IF POSITIVE: ANA: NEGATIVE

## 2017-03-23 LAB — CK: CK TOTAL: 136 U/L (ref 24–173)

## 2017-03-23 LAB — C-REACTIVE PROTEIN: CRP: 3.8 mg/L (ref 0.0–4.9)

## 2017-03-23 LAB — RHEUMATOID FACTOR: Rhuematoid fact SerPl-aCnc: 16.3 IU/mL — ABNORMAL HIGH (ref 0.0–13.9)

## 2017-03-23 LAB — TSH: TSH: 1.22 u[IU]/mL (ref 0.450–4.500)

## 2017-03-23 NOTE — Telephone Encounter (Signed)
-----   Message from [name], [place] sent at 03/23/2017  2:16 PM EDT ----- Labs are fairly stable with exception of elevated A1c to 6.0 from 5.7. Continue working on dietary changes limiting carbohydrates and sugars. Cholesterol is stable and WNL. Inflammatory markers are normal but RF is borderline elevated. Would recommend referral to rheumatology for further evaluation if agreeable since she is having the muscle and joint pains.

## 2017-03-23 NOTE — Telephone Encounter (Signed)
Referral placed.

## 2017-03-23 NOTE — Telephone Encounter (Signed)
Patient advised as directed below. Pt ok with the rheumatology referral. She doesn't have any preferences. Thanks,  -Joseline

## 2017-03-29 ENCOUNTER — Other Ambulatory Visit: Payer: Self-pay | Admitting: Physician Assistant

## 2017-03-29 DIAGNOSIS — E7800 Pure hypercholesterolemia, unspecified: Secondary | ICD-10-CM

## 2017-04-06 DIAGNOSIS — R252 Cramp and spasm: Secondary | ICD-10-CM | POA: Insufficient documentation

## 2017-04-06 DIAGNOSIS — R768 Other specified abnormal immunological findings in serum: Secondary | ICD-10-CM | POA: Insufficient documentation

## 2017-04-07 ENCOUNTER — Ambulatory Visit
Admission: RE | Admit: 2017-04-07 | Discharge: 2017-04-07 | Disposition: A | Discharge status: Home / Self Care | Payer: 59 | Source: Ambulatory Visit | Attending: Physician Assistant | Admitting: Physician Assistant

## 2017-04-07 DIAGNOSIS — Z1239 Encounter for other screening for malignant neoplasm of breast: Secondary | ICD-10-CM

## 2017-04-07 DIAGNOSIS — Z1231 Encounter for screening mammogram for malignant neoplasm of breast: Secondary | ICD-10-CM | POA: Insufficient documentation

## 2017-04-11 ENCOUNTER — Telehealth: Payer: Self-pay | Admitting: Physician Assistant

## 2017-04-11 NOTE — Telephone Encounter (Signed)
Pt is returning call.  HD#[phone]/[id number]

## 2017-04-11 NOTE — Progress Notes (Signed)
Advised  ED 

## 2017-06-28 ENCOUNTER — Ambulatory Visit (INDEPENDENT_AMBULATORY_CARE_PROVIDER_SITE_OTHER): Payer: 59 | Admitting: Urology

## 2017-06-28 ENCOUNTER — Ambulatory Visit: Payer: 59 | Admitting: Urology

## 2017-06-28 ENCOUNTER — Encounter: Payer: Self-pay | Admitting: Urology

## 2017-06-28 VITALS — BP 147/88 | Ht 64.0 in | Wt 203.0 lb

## 2017-06-28 DIAGNOSIS — R3989 Other symptoms and signs involving the genitourinary system: Secondary | ICD-10-CM

## 2017-06-28 DIAGNOSIS — Z87440 Personal history of urinary (tract) infections: Secondary | ICD-10-CM

## 2017-06-28 MED ORDER — OXYBUTYNIN CHLORIDE 5 MG PO TABS: 5.0000 mg | ORAL_TABLET | Freq: Three times a day (TID) | ORAL | 0 refills | Status: DC | PRN

## 2017-06-28 NOTE — Progress Notes (Signed)
06/28/2017 4:51 PM   [name] 1951/08/25 [id number]  Referring provider: Margarita Rana, MD No address on file  No chief complaint on file.   HPI:  66 year old female with a history of microscopic hematuria, cystitis who underwent bladder biopsy on 11/2015 for suspicious erythematous patches in the bladder. Surgical pathology consistent with chronic cystitis with lymphoid aggregates and mixed inflammatory infiltrates including eosinophils and plasma cells.  Since last year, she's been doing much better. She no longer has significant urinary symptoms. Her dysuria has mostly resolved. Occasionally, she'll have some pressure after voiding but this is not severe.  No UTIs. No gross hematuria.  UA today negative for any microscope of blood.    Never smoker.   PMH: Past Medical History:  Diagnosis Date  . Acid reflux 04/02/2001  . Adiposity 04/20/1993  . Benign essential HTN 11/11/1993  . Diabetes mellitus without complication (HCC)    diet   . Heart murmur   . Hypertension   . Syncope and collapse 01/20/2009  . Tinnitus    left ear  . Urticaria 06/04/2015    Surgical History: Past Surgical History:  Procedure Laterality Date  . CYSTOSCOPY WITH BIOPSY N/A 12/22/2015   Procedure: CYSTOSCOPY WITH BIOPSY;  Surgeon: [name], MD;  Location: [facility] ORS;  Service: Urology;  Laterality: N/A;  . TUBAL LIGATION  1987  . TUMMY TUCK  06/17/2010    Home Medications:  Allergies as of 06/28/2017      Reactions   Ibuprofen    Other reaction(s): Abdominal Pain Can't take NSAIDS secondary to erosions due to NSAIDS.   Nsaids    Can't take NSAIDS secondary to erosions due to NSAIDS.      Medication List       Accurate as of 06/28/17 11:59 PM. Always use your most recent med list.          ALPRAZolam 0.5 MG tablet Commonly known as:  XANAX Take 1 tablet (0.5 mg total) by mouth every 8 (eight) hours as needed.   calcium carbonate 1250 (500 Ca) MG tablet Commonly  known as:  OS-CAL - dosed in mg of elemental calcium Take 1 tablet by mouth daily with breakfast.   Cholecalciferol 1000 units capsule Take 500 Units by mouth daily.   Cinnamon 500 MG capsule Take 500 mg by mouth daily.   COLLAGEN PO Take 1 tablet by mouth.   CoQ10 200 MG Caps Take 1 capsule by mouth daily.   cyanocobalamin 1000 MCG tablet Take 1 tablet by mouth daily.   cyclobenzaprine 5 MG tablet Commonly known as:  FLEXERIL Take 1-2 tablets (5-10 mg total) by mouth at bedtime.   gabapentin 100 MG capsule Commonly known as:  NEURONTIN Take 2 capsules by mouth 2 (two) times daily.   hydrOXYzine 25 MG tablet Commonly known as:  ATARAX/VISTARIL TAKE 1 TABLET (25 MG TOTAL) BY MOUTH EVERY 8 (EIGHT) HOURS AS NEEDED (FOR HIVES/ITCHING).   levocetirizine 5 MG tablet Commonly known as:  XYZAL TAKE 1 TABLET EVERY DAY   montelukast 10 MG tablet Commonly known as:  SINGULAIR TAKE 1 TABLET BY MOUTH DAILY   oxybutynin 5 MG tablet Commonly known as:  DITROPAN Take 1 tablet (5 mg total) by mouth every 8 (eight) hours as needed for bladder spasms.   pravastatin 20 MG tablet Commonly known as:  PRAVACHOL TAKE 1 TABLET BY MOUTH AT BEDTIME   PROBIOTIC ACIDOPHILUS PO Take 1 capsule by mouth daily.   ramipril 2.5 MG capsule Commonly known  as:  ALTACE TAKE 1 CAPSULE BY MOUTH EVERY DAY   triamcinolone cream 0.1 % Commonly known as:  KENALOG Apply 1 application topically 2 (two) times daily.            Discharge Care Instructions        Start     Ordered   06/28/17 0000  Urinalysis, Complete     06/28/17 1629   06/28/17 0000  oxybutynin (DITROPAN) 5 MG tablet  Every 8 hours PRN     06/28/17 1653   06/28/17 0000  Microscopic Examination     06/28/17 0000      Allergies:  Allergies  Allergen Reactions  . Ibuprofen     Other reaction(s): Abdominal Pain Can't take NSAIDS secondary to erosions due to NSAIDS.  Marland Kitchen Nsaids     Can't take NSAIDS secondary to erosions  due to NSAIDS.    Family History: Family History  Problem Relation Age of Onset  . Heart attack Mother   . Stroke Mother   . Diabetes Mother   . Throat cancer Mother   . Heart attack Father   . Hypertension Father   . Glaucoma Father   . AAA (abdominal aortic aneurysm) Father   . Obesity Sister   . Throat cancer Maternal Grandmother   . Heart attack Maternal Grandfather   . CVA Paternal Grandmother   . Congestive Heart Failure Paternal Grandmother   . Heart attack Paternal Grandfather     Social History:  reports that she has never smoked. She has never used smokeless tobacco. She reports that she does not drink alcohol or use drugs.  ROS: UROLOGY Frequent Urination?: Yes Hard to postpone urination?: No Burning/pain with urination?: No Get up at night to urinate?: No Leakage of urine?: No Urine stream starts and stops?: Yes Trouble starting stream?: No Do you have to strain to urinate?: No Blood in urine?: No Urinary tract infection?: No Sexually transmitted disease?: No Injury to kidneys or bladder?: No Painful intercourse?: No Weak stream?: No Currently pregnant?: No Vaginal bleeding?: No Last menstrual period?: n  Gastrointestinal Nausea?: No Vomiting?: No Indigestion/heartburn?: No Diarrhea?: No Constipation?: No  Constitutional Fever: No Night sweats?: No Weight loss?: No Fatigue?: No  Skin Skin rash/lesions?: No Itching?: No  Eyes Blurred vision?: No Double vision?: No  Ears/Nose/Throat Sore throat?: No Sinus problems?: Yes  Hematologic/Lymphatic Swollen glands?: No Easy bruising?: Yes  Cardiovascular Leg swelling?: Yes Chest pain?: No  Respiratory Cough?: No Shortness of breath?: No  Endocrine Excessive thirst?: No  Musculoskeletal Back pain?: No Joint pain?: Yes  Neurological Headaches?: No Dizziness?: Yes  Psychologic Depression?: No Anxiety?: No  Physical Exam: BP (!) 147/88 (BP Location: Left Arm, Patient  Position: Sitting, Cuff Size: Large)   Ht 5\' 4"  (1.626 m)   Wt 203 lb (92.1 kg)   BMI 34.84 kg/m   Constitutional:  Alert and oriented, No acute distress. HEENT: [place] AT, moist mucus membranes.  Trachea midline, no masses. Cardiovascular: No clubbing, cyanosis, or edema. Respiratory: Normal respiratory effort, no increased work of breathing. GI: Abdomen is soft, nontender, nondistended, no abdominal masses GU: No CVA tenderness.  Skin: No rashes, bruises or suspicious lesions. Neurologic: Grossly intact, no focal deficits, moving all 4 extremities. Psychiatric: Normal mood and affect.  Laboratory Data: Lab Results  Component Value Date   WBC 3.1 (L) 03/22/2017   HGB 14.0 03/22/2017   HCT 41.3 03/22/2017   MCV 88 03/22/2017   PLT 182 03/22/2017    Lab  Results  Component Value Date   CREATININE 0.70 03/22/2017    No results found for: PSA  No results found for: TESTOSTERONE  Lab Results  Component Value Date   HGBA1C 6.0 (H) 03/22/2017    Urinalysis Results for orders placed or performed in visit on 06/28/17  Microscopic Examination  Result Value Ref Range   WBC, UA 0-5 0 - 5 /hpf   RBC, UA None seen 0 - 2 /hpf   Epithelial Cells (non renal) 0-10 0 - 10 /hpf   Bacteria, UA None seen None seen/Few  Urinalysis, Complete  Result Value Ref Range   Specific Gravity, UA <1.005 (L) 1.005 - 1.030   pH, UA 6.5 5.0 - 7.5   Color, UA Yellow Yellow   Appearance Ur Clear Clear   Leukocytes, UA Trace (A) Negative   Protein, UA Negative Negative/Trace   Glucose, UA Negative Negative   Ketones, UA Negative Negative   RBC, UA Negative Negative   Bilirubin, UA Negative Negative   Urobilinogen, Ur 0.2 0.2 - 1.0 mg/dL   Nitrite, UA Negative Negative   Microscopic Examination See below:     Pertinent Imaging: N/a  Assessment & Plan:    1. History of cystitis S/p biopsy 11/2015 without Evidence of malignancy No evidence of blood today on microscopic examination Symptoms  essentially resolved Annual UA by PCP- return if microscopic blood returns, discussed with patient - Urinalysis, Complete  2. Sensation of pressure in bladder area Recommend use of Ditropan when necessary, prescription refilled  F/u prn  [name], MD  [facility] [address] [place], [place] [address] [phone]

## 2017-06-29 LAB — MICROSCOPIC EXAMINATION
BACTERIA UA: NONE SEEN
RBC MICROSCOPIC, UA: NONE SEEN /HPF (ref 0–?)

## 2017-06-29 LAB — URINALYSIS, COMPLETE
Bilirubin, UA: NEGATIVE
Glucose, UA: NEGATIVE
Ketones, UA: NEGATIVE
NITRITE UA: NEGATIVE
PH UA: 6.5 (ref 5.0–7.5)
Protein, UA: NEGATIVE
RBC UA: NEGATIVE
Specific Gravity, UA: <1.005 — ABNORMAL LOW (ref 1.005–1.030)
UUROB: 0.2 mg/dL (ref 0.2–1.0)

## 2017-07-05 ENCOUNTER — Other Ambulatory Visit: Payer: Self-pay | Admitting: Urology

## 2017-08-02 ENCOUNTER — Other Ambulatory Visit: Payer: Self-pay | Admitting: Physician Assistant

## 2017-08-02 DIAGNOSIS — J309 Allergic rhinitis, unspecified: Secondary | ICD-10-CM

## 2017-09-02 ENCOUNTER — Other Ambulatory Visit: Payer: Self-pay | Admitting: Physician Assistant

## 2017-09-02 DIAGNOSIS — F411 Generalized anxiety disorder: Secondary | ICD-10-CM

## 2017-09-04 ENCOUNTER — Other Ambulatory Visit: Payer: Self-pay | Admitting: Physician Assistant

## 2017-09-04 DIAGNOSIS — R252 Cramp and spasm: Secondary | ICD-10-CM

## 2017-09-20 ENCOUNTER — Other Ambulatory Visit: Payer: Self-pay | Admitting: Physician Assistant

## 2017-09-20 DIAGNOSIS — I10 Essential (primary) hypertension: Secondary | ICD-10-CM

## 2017-10-11 ENCOUNTER — Other Ambulatory Visit: Payer: Self-pay | Admitting: Family Medicine

## 2017-10-11 ENCOUNTER — Other Ambulatory Visit: Payer: Self-pay | Admitting: Physician Assistant

## 2017-10-11 DIAGNOSIS — E7800 Pure hypercholesterolemia, unspecified: Secondary | ICD-10-CM

## 2017-10-11 DIAGNOSIS — J309 Allergic rhinitis, unspecified: Secondary | ICD-10-CM

## 2018-01-22 ENCOUNTER — Encounter: Payer: Self-pay | Admitting: Physician Assistant

## 2018-01-22 ENCOUNTER — Ambulatory Visit (INDEPENDENT_AMBULATORY_CARE_PROVIDER_SITE_OTHER): Payer: 59 | Admitting: Physician Assistant

## 2018-01-22 VITALS — BP 120/78 | HR 67 | Temp 97.8°F | Resp 16 | Wt 214.6 lb

## 2018-01-22 DIAGNOSIS — L409 Psoriasis, unspecified: Secondary | ICD-10-CM | POA: Diagnosis not present

## 2018-01-22 DIAGNOSIS — K648 Other hemorrhoids: Secondary | ICD-10-CM

## 2018-01-22 MED ORDER — TRIAMCINOLONE ACETONIDE 0.1 % EX CREA: 1.0000 "application " | TOPICAL_CREAM | Freq: Two times a day (BID) | CUTANEOUS | 0 refills | Status: DC

## 2018-01-22 MED ORDER — HYDROCORTISONE ACETATE 25 MG RE SUPP: 25.0000 mg | Freq: Two times a day (BID) | RECTAL | 0 refills | Status: DC

## 2018-01-22 NOTE — Progress Notes (Signed)
Patient: Virginia Weaver Female    DOB: Nov 02, 1950   67 y.o.   MRN: [id number] Visit Date: 01/22/2018  Today's Provider: [name], PA-C   Chief Complaint  Patient presents with  . Blood In Stools   Subjective:    HPI Blood in Stool: Patient presents for presents evaluation of blood in stool/ rectal bleeding. Patient has associated symptoms of none. The patient denies abdominal pain, alternating loose stools and constipation, constipation, diarrhea and loose stools.  The patient has a known history of: external hemorroids and internal hemorrhoids. The patient has had several episodes of rectal bleeding.  There is not a history of rectal injury. Patient had similar episodes of rectal bleeding in the past.     Allergies  Allergen Reactions  . Ibuprofen     Other reaction(s): Abdominal Pain Can't take NSAIDS secondary to erosions due to NSAIDS.  Marland Kitchen Nsaids     Can't take NSAIDS secondary to erosions due to NSAIDS.     Current Outpatient Medications:  .  ALPRAZolam (XANAX) 0.5 MG tablet, TAKE 1 TABLET BY MOUTH EVERY 8 HOURS AS NEEDED, Disp: 30 tablet, Rfl: 5 .  calcium carbonate (OS-CAL - DOSED IN MG OF ELEMENTAL CALCIUM) 1250 (500 Ca) MG tablet, Take 1 tablet by mouth daily with breakfast., Disp: , Rfl:  .  Cholecalciferol 1000 UNITS capsule, Take 500 Units by mouth daily. , Disp: , Rfl:  .  Cinnamon 500 MG capsule, Take 500 mg by mouth daily., Disp: , Rfl:  .  Coenzyme Q10 (COQ10) 200 MG CAPS, Take 1 capsule by mouth daily., Disp: 30 capsule, Rfl: 0 .  cyanocobalamin 1000 MCG tablet, Take 1 tablet by mouth daily., Disp: , Rfl:  .  gabapentin (NEURONTIN) 100 MG capsule, Take 2 capsules by mouth 2 (two) times daily., Disp: , Rfl:  .  hydrOXYzine (ATARAX/VISTARIL) 25 MG tablet, TAKE 1 TABLET (25 MG TOTAL) BY MOUTH EVERY 8 (EIGHT) HOURS AS NEEDED (FOR HIVES/ITCHING)., Disp: 30 tablet, Rfl: 3 .  Lactobacillus (PROBIOTIC ACIDOPHILUS PO), Take 1 capsule by mouth daily.,  Disp: , Rfl:  .  levocetirizine (XYZAL) 5 MG tablet, TAKE 1 TABLET EVERY DAY, Disp: 90 tablet, Rfl: 3 .  montelukast (SINGULAIR) 10 MG tablet, TAKE 1 TABLET BY MOUTH DAILY, Disp: 30 tablet, Rfl: 6 .  oxybutynin (DITROPAN) 5 MG tablet, Take 1 tablet (5 mg total) by mouth every 8 (eight) hours as needed for bladder spasms., Disp: 30 tablet, Rfl: 0 .  pravastatin (PRAVACHOL) 20 MG tablet, TAKE 1 TABLET BY MOUTH AT BEDTIME, Disp: 90 tablet, Rfl: 1 .  ramipril (ALTACE) 2.5 MG capsule, TAKE 1 CAPSULE BY MOUTH EVERY DAY, Disp: 90 capsule, Rfl: 1 .  COLLAGEN PO, Take 1 tablet by mouth., Disp: , Rfl:   Review of Systems  Cardiovascular: Negative for chest pain, palpitations and leg swelling.  Gastrointestinal: Positive for blood in stool (on toilet paper only when she wiped after BM). Negative for abdominal pain, anal bleeding, constipation and diarrhea.    Social History   Tobacco Use  . Smoking status: Never Smoker  . Smokeless tobacco: Never Used  Substance Use Topics  . Alcohol use: No    Alcohol/week: 0.0 oz   Objective:   BP 120/78 (BP Location: Left Arm, Patient Position: Sitting, Cuff Size: Normal)   Pulse 67   Temp 97.8 F (36.6 C) (Oral)   Resp 16   Wt 214 lb 9.6 oz (97.3 kg)   SpO2  98%   BMI 36.84 kg/m    Physical Exam  Constitutional: She appears well-developed and well-nourished. No distress.  Neck: Normal range of motion. Neck supple.  Cardiovascular: Normal rate, regular rhythm and normal heart sounds.  Pulmonary/Chest: Effort normal and breath sounds normal.  Genitourinary:     Skin: She is not diaphoretic.  Vitals reviewed.       Assessment & Plan:     1. Internal hemorrhoid Improved. No bleeding per patient since Saturday. Patient agrees she felt 3 small hemorrhoids today. Over the weekend there was a 4th larger one, which seems to have been a prolapsed internal hemorrhoid, that has now subsided. Anusol HC suppositories prescribed as below in case it  happens again. She is to call if no improvements or if bleeding worsens.  - hydrocortisone (ANUSOL-HC) 25 MG suppository; Place 1 suppository (25 mg total) rectally 2 (two) times daily.  Dispense: 12 suppository; Refill: 0  2. Psoriasis Stable. Diagnosis pulled for medication refill. Continue current medical treatment plan. - triamcinolone cream (KENALOG) 0.1 %; Apply 1 application topically 2 (two) times daily.  Dispense: 30 g; Refill: 0       [name], PA-C  [facility]

## 2018-02-20 ENCOUNTER — Ambulatory Visit: Payer: 59

## 2018-02-20 ENCOUNTER — Telehealth: Payer: Self-pay | Admitting: Urology

## 2018-02-20 VITALS — BP 145/76 | HR 68 | Resp 16 | Ht 64.0 in | Wt 212.6 lb

## 2018-02-20 DIAGNOSIS — R309 Painful micturition, unspecified: Secondary | ICD-10-CM

## 2018-02-20 LAB — MICROSCOPIC EXAMINATION
BACTERIA UA: NONE SEEN
Epithelial Cells (non renal): NONE SEEN /hpf (ref 0–10)
RBC, UA: NONE SEEN /hpf (ref 0–2)

## 2018-02-20 LAB — URINALYSIS, COMPLETE
Bilirubin, UA: NEGATIVE
Glucose, UA: NEGATIVE
Ketones, UA: NEGATIVE
Leukocytes, UA: NEGATIVE
Nitrite, UA: NEGATIVE
PH UA: 6 (ref 5.0–7.5)
PROTEIN UA: NEGATIVE
RBC, UA: NEGATIVE
Specific Gravity, UA: 1.01 (ref 1.005–1.030)
Urobilinogen, Ur: 0.2 mg/dL (ref 0.2–1.0)

## 2018-02-20 NOTE — Progress Notes (Signed)
Pt presents today for urinary frequency, back/flank pain, nausea, and urinary urgency. Pt provided a urine sample and will wait for UA/CX results.   Blood pressure (!) 145/76, pulse 68, resp. rate 16, height 5\' 4"  (1.626 m), weight 212 lb 9.6 oz (96.4 kg), SpO2 98 %.

## 2018-02-20 NOTE — Telephone Encounter (Signed)
Pt called office, states she's been experiencing pressure and painful urination for a couple weeks, pt wanted to come in to rule out possible UTI. Added pt to nurse schedule @ 3:15 today. Thanks.

## 2018-02-23 ENCOUNTER — Telehealth: Payer: Self-pay

## 2018-02-23 LAB — CULTURE, URINE COMPREHENSIVE

## 2018-02-23 NOTE — Telephone Encounter (Signed)
-----   Message from [name], MD sent at 02/23/2018 11:50 AM EDT ----- Please let this patient know that her urine culture was negative.  No evidence of infection.  If she continues to have urinary symptoms, please make an appointment to discuss these in further detail.  [name], MD

## 2018-02-23 NOTE — Telephone Encounter (Signed)
Called and spoke w/ pt, she would like to come in for appointment, scheduled for 03/02/18 @ 1100AM.

## 2018-03-02 ENCOUNTER — Encounter: Payer: Self-pay | Admitting: Urology

## 2018-03-02 ENCOUNTER — Ambulatory Visit (INDEPENDENT_AMBULATORY_CARE_PROVIDER_SITE_OTHER): Payer: 59 | Admitting: Urology

## 2018-03-02 VITALS — BP 146/86 | HR 69 | Ht 64.0 in | Wt 212.0 lb

## 2018-03-02 DIAGNOSIS — N3020 Other chronic cystitis without hematuria: Secondary | ICD-10-CM | POA: Diagnosis not present

## 2018-03-02 DIAGNOSIS — N3281 Overactive bladder: Secondary | ICD-10-CM | POA: Diagnosis not present

## 2018-03-02 LAB — BLADDER SCAN AMB NON-IMAGING

## 2018-03-02 LAB — URINALYSIS, COMPLETE
BILIRUBIN UA: NEGATIVE
Glucose, UA: NEGATIVE
Ketones, UA: NEGATIVE
NITRITE UA: NEGATIVE
PH UA: 7 (ref 5.0–7.5)
Protein, UA: NEGATIVE
RBC UA: NEGATIVE
Specific Gravity, UA: 1.015 (ref 1.005–1.030)
UUROB: 0.2 mg/dL (ref 0.2–1.0)

## 2018-03-02 LAB — MICROSCOPIC EXAMINATION

## 2018-03-04 NOTE — Progress Notes (Signed)
03/02/2018 8:03 PM   Virginia Weaver 17-May-1951 [id number]  Referring provider: [name], PA-C [place], [place] [address]  Chief Complaint  Patient presents with  . Recurrent UTI    HPI: 67 year old female who presents today to discuss urinary frequency and urgency.  She was initially seen and evaluated in 2017 dysuria found to have chronic cystitis with inflammatory infiltrates including eosinophils and plasma cells.  She was seen in our office with urinary frequency and pressure concerning for possible UTI.  Her UA was fairly unremarkable with a negative culture.  She reports over the past few months, she is developed increasing urinary frequency and urgency.  She does note several lifestyle issues including slow and steady weight gain, possible issues with blood sugar control, and behaviorally continues to drink tea as her primary beverage.  She denies any gross hematuria or dysuria.  She denies any incontinence.   PMH: Past Medical History:  Diagnosis Date  . Acid reflux 04/02/2001  . Adiposity 04/20/1993  . Benign essential HTN 11/11/1993  . Diabetes mellitus without complication (HCC)    diet   . Heart murmur   . Hypertension   . Syncope and collapse 01/20/2009  . Tinnitus    left ear  . Urticaria 06/04/2015    Surgical History: Past Surgical History:  Procedure Laterality Date  . CYSTOSCOPY WITH BIOPSY N/A 12/22/2015   Procedure: CYSTOSCOPY WITH BIOPSY;  Surgeon: [name], MD;  Location: [facility] ORS;  Service: Urology;  Laterality: N/A;  . TUBAL LIGATION  1987  . TUMMY TUCK  06/17/2010    Home Medications:  Allergies as of 03/02/2018      Reactions   Ibuprofen    Other reaction(s): Abdominal Pain Can't take NSAIDS secondary to erosions due to NSAIDS.   Nsaids    Can't take NSAIDS secondary to erosions due to NSAIDS.      Medication List        Accurate as of 03/02/18 11:59 PM. Always use your most recent med  list.          ALPRAZolam 0.5 MG tablet Commonly known as:  XANAX TAKE 1 TABLET BY MOUTH EVERY 8 HOURS AS NEEDED   calcium carbonate 1250 (500 Ca) MG tablet Commonly known as:  OS-CAL - dosed in mg of elemental calcium Take 1 tablet by mouth daily with breakfast.   Cholecalciferol 1000 units capsule Take 500 Units by mouth daily.   Cinnamon 500 MG capsule Take 500 mg by mouth daily.   COLLAGEN PO Take 1 tablet by mouth.   CoQ10 200 MG Caps Take 1 capsule by mouth daily.   cyanocobalamin 1000 MCG tablet Take 1 tablet by mouth daily.   gabapentin 100 MG capsule Commonly known as:  NEURONTIN Take 2 capsules by mouth 2 (two) times daily.   hydrocortisone 25 MG suppository Commonly known as:  ANUSOL-HC Place 1 suppository (25 mg total) rectally 2 (two) times daily.   hydrOXYzine 25 MG tablet Commonly known as:  ATARAX/VISTARIL TAKE 1 TABLET (25 MG TOTAL) BY MOUTH EVERY 8 (EIGHT) HOURS AS NEEDED (FOR HIVES/ITCHING).   levocetirizine 5 MG tablet Commonly known as:  XYZAL TAKE 1 TABLET EVERY DAY   montelukast 10 MG tablet Commonly known as:  SINGULAIR TAKE 1 TABLET BY MOUTH DAILY   oxybutynin 5 MG tablet Commonly known as:  DITROPAN Take 1 tablet (5 mg total) by mouth every 8 (eight) hours as needed for bladder spasms.   pravastatin 20  MG tablet Commonly known as:  PRAVACHOL TAKE 1 TABLET BY MOUTH AT BEDTIME   PROBIOTIC ACIDOPHILUS PO Take 1 capsule by mouth daily.   ramipril 2.5 MG capsule Commonly known as:  ALTACE TAKE 1 CAPSULE BY MOUTH EVERY DAY   triamcinolone cream 0.1 % Commonly known as:  KENALOG Apply 1 application topically 2 (two) times daily.       Allergies:  Allergies  Allergen Reactions  . Ibuprofen     Other reaction(s): Abdominal Pain Can't take NSAIDS secondary to erosions due to NSAIDS.  Marland Kitchen Nsaids     Can't take NSAIDS secondary to erosions due to NSAIDS.    Family History: Family History  Problem Relation Age of Onset  .  Heart attack Mother   . Stroke Mother   . Diabetes Mother   . Throat cancer Mother   . Heart attack Father   . Hypertension Father   . Glaucoma Father   . AAA (abdominal aortic aneurysm) Father   . Obesity Sister   . Throat cancer Maternal Grandmother   . Heart attack Maternal Grandfather   . CVA Paternal Grandmother   . Congestive Heart Failure Paternal Grandmother   . Heart attack Paternal Grandfather     Social History:  reports that she has never smoked. She has never used smokeless tobacco. She reports that she does not drink alcohol or use drugs.  ROS: UROLOGY Frequent Urination?: Yes Hard to postpone urination?: No Burning/pain with urination?: No Get up at night to urinate?: Yes Leakage of urine?: No Urine stream starts and stops?: No Trouble starting stream?: No Do you have to strain to urinate?: No Blood in urine?: No Urinary tract infection?: No Sexually transmitted disease?: No Injury to kidneys or bladder?: No Painful intercourse?: No Weak stream?: No Currently pregnant?: No Vaginal bleeding?: No Last menstrual period?: n  Gastrointestinal Nausea?: Yes Vomiting?: No Indigestion/heartburn?: No Diarrhea?: No Constipation?: No  Constitutional Fever: No Night sweats?: No Weight loss?: No Fatigue?: No  Skin Skin rash/lesions?: No Itching?: No  Eyes Blurred vision?: No Double vision?: No  Ears/Nose/Throat Sore throat?: No Sinus problems?: Yes  Hematologic/Lymphatic Swollen glands?: No Easy bruising?: Yes  Cardiovascular Leg swelling?: Yes Chest pain?: No  Respiratory Cough?: No Shortness of breath?: No  Endocrine Excessive thirst?: Yes  Musculoskeletal Back pain?: Yes Joint pain?: No  Neurological Headaches?: Yes Dizziness?: Yes  Psychologic Depression?: No Anxiety?: Yes  Physical Exam: BP (!) 146/86   Pulse 69   Ht 5\' 4"  (1.626 m)   Wt 212 lb (96.2 kg)   BMI 36.39 kg/m   Constitutional:  Alert and oriented, No  acute distress. HEENT: [place] AT, moist mucus membranes.  Trachea midline, no masses. Cardiovascular: No clubbing, cyanosis, or edema. Respiratory: Normal respiratory effort, no increased work of breathing. Skin: No rashes, bruises or suspicious lesions. Neurologic: Grossly intact, no focal deficits, moving all 4 extremities. Psychiatric: Normal mood and affect.  Laboratory Data: Lab Results  Component Value Date   WBC 3.1 (L) 03/22/2017   HGB 14.0 03/22/2017   HCT 41.3 03/22/2017   MCV 88 03/22/2017   PLT 182 03/22/2017    Lab Results  Component Value Date   CREATININE 0.70 03/22/2017    Lab Results  Component Value Date   HGBA1C 6.0 (H) 03/22/2017     UA/ Pertinent Imaging: Results for orders placed or performed in visit on 03/02/18  Microscopic Examination  Result Value Ref Range   WBC, UA 6-10 (A) 0 - 5 /hpf  RBC, UA 0-2 0 - 2 /hpf   Epithelial Cells (non renal) 0-10 0 - 10 /hpf   Renal Epithel, UA 0-10 (A) None seen /hpf   Mucus, UA Present (A) Not Estab.   Bacteria, UA Moderate (A) None seen/Few  Urinalysis, Complete  Result Value Ref Range   Specific Gravity, UA 1.015 1.005 - 1.030   pH, UA 7.0 5.0 - 7.5   Color, UA Yellow Yellow   Appearance Ur Clear Clear   Leukocytes, UA 1+ (A) Negative   Protein, UA Negative Negative/Trace   Glucose, UA Negative Negative   Ketones, UA Negative Negative   RBC, UA Negative Negative   Bilirubin, UA Negative Negative   Urobilinogen, Ur 0.2 0.2 - 1.0 mg/dL   Nitrite, UA Negative Negative   Microscopic Examination See below:   BLADDER SCAN AMB NON-IMAGING  Result Value Ref Range   Scan Result 70ml     Assessment & Plan:    1. Chronic cystitis No concern for infection  urine cytology sent today although symptoms more consistent with OAB underlying malignancy Status post negative biopsy in 2017 which is reassuring - Urinalysis, Complete - BLADDER SCAN AMB NON-IMAGING  2. OAB (overactive bladder) We discussed  behavioral modification at length today She will cut back on her tea and increase water She will be meeting with her primary care physician in the near future with a hemoglobin A1c and work on sugar control She was given samples of Myrbetriq 25 mg x 4 weeks today and let us know how this works, or scription TBD based on insurance coverage and formulary  Patient to call after 1 month and let us know how the medication is working, if not well will increase dose to 50 mg if well, will prescribe Myrbetriq or anticholinergic alternative  [name], MD  [facility] [address], [place], [place] [address] [phone]

## 2018-03-07 ENCOUNTER — Other Ambulatory Visit: Payer: Self-pay | Admitting: Urology

## 2018-03-20 ENCOUNTER — Telehealth: Payer: Self-pay | Admitting: Urology

## 2018-03-20 MED ORDER — MIRABEGRON ER 25 MG PO TB24: 25.0000 mg | ORAL_TABLET | Freq: Every day | ORAL | 11 refills | Status: DC

## 2018-03-20 NOTE — Telephone Encounter (Signed)
Patient called and wanted to let you know that the Myrbetriq 25 mg samples you gave her seem to be working and that she would like to go ahead with a prescription. If that could be sent to her [facility] on [address]  Thanks, [place]

## 2018-03-20 NOTE — Telephone Encounter (Signed)
Prescription sent to pharmacy.  Please let her know that I like to see her in a year or sooner as needed if her symptoms worsen.    Also, letter know that this medication can cause a slight rise in blood pressure.  I see that she has a follow-up with her primary care physician in the near future.  Please pay close attention to her blood pressure at this visit and call if issues.    [name], MD

## 2018-03-21 NOTE — Telephone Encounter (Signed)
Patient notified

## 2018-03-27 ENCOUNTER — Encounter: Payer: Self-pay | Admitting: Physician Assistant

## 2018-03-27 ENCOUNTER — Other Ambulatory Visit: Payer: Self-pay | Admitting: Physician Assistant

## 2018-03-27 ENCOUNTER — Ambulatory Visit (INDEPENDENT_AMBULATORY_CARE_PROVIDER_SITE_OTHER): Payer: 59 | Admitting: Physician Assistant

## 2018-03-27 VITALS — BP 130/88 | HR 64 | Temp 97.9°F | Resp 16 | Ht 64.0 in | Wt 214.0 lb

## 2018-03-27 DIAGNOSIS — E7800 Pure hypercholesterolemia, unspecified: Secondary | ICD-10-CM | POA: Diagnosis not present

## 2018-03-27 DIAGNOSIS — E538 Deficiency of other specified B group vitamins: Secondary | ICD-10-CM

## 2018-03-27 DIAGNOSIS — I10 Essential (primary) hypertension: Secondary | ICD-10-CM

## 2018-03-27 DIAGNOSIS — E559 Vitamin D deficiency, unspecified: Secondary | ICD-10-CM

## 2018-03-27 DIAGNOSIS — Z1231 Encounter for screening mammogram for malignant neoplasm of breast: Secondary | ICD-10-CM | POA: Diagnosis not present

## 2018-03-27 DIAGNOSIS — Z6835 Body mass index (BMI) 35.0-35.9, adult: Secondary | ICD-10-CM

## 2018-03-27 DIAGNOSIS — E119 Type 2 diabetes mellitus without complications: Secondary | ICD-10-CM

## 2018-03-27 DIAGNOSIS — F321 Major depressive disorder, single episode, moderate: Secondary | ICD-10-CM | POA: Diagnosis not present

## 2018-03-27 DIAGNOSIS — J309 Allergic rhinitis, unspecified: Secondary | ICD-10-CM

## 2018-03-27 DIAGNOSIS — D709 Neutropenia, unspecified: Secondary | ICD-10-CM | POA: Diagnosis not present

## 2018-03-27 DIAGNOSIS — R252 Cramp and spasm: Secondary | ICD-10-CM

## 2018-03-27 DIAGNOSIS — Z1239 Encounter for other screening for malignant neoplasm of breast: Secondary | ICD-10-CM

## 2018-03-27 DIAGNOSIS — Z6836 Body mass index (BMI) 36.0-36.9, adult: Secondary | ICD-10-CM | POA: Diagnosis not present

## 2018-03-27 DIAGNOSIS — Z0000 Encounter for general adult medical examination without abnormal findings: Secondary | ICD-10-CM

## 2018-03-27 DIAGNOSIS — E66812 Obesity, class 2: Secondary | ICD-10-CM | POA: Insufficient documentation

## 2018-03-27 LAB — POCT UA - MICROALBUMIN: Microalbumin Ur, POC: 20 mg/L

## 2018-03-27 NOTE — Progress Notes (Signed)
Patient: [place] Weaver, Female    DOB: [birth year], 67 y.o.   MRN: [id number] Visit Date: 03/27/2018  Today's Provider: [name], PA-C   Chief Complaint  Patient presents with  . Annual Exam   Subjective:    Annual physical exam [place] Weaver is a 67 y.o. female who presents today for health maintenance and complete physical. She feels fairly well. She reports exercising none. She reports she is sleeping well.  03/22/17 CPE 03/16/15 Pap-negative HPV-negative 04/07/17 Mammogram-BI-RADS 1 02/17/09 Colonoscopy- normal  -----------------------------------------------------------------   Review of Systems  Constitutional: Positive for activity change, appetite change and fatigue.  HENT: Negative.   Eyes: Negative.   Respiratory: Negative.   Cardiovascular: Positive for palpitations and leg swelling.  Gastrointestinal: Negative.   Endocrine: Positive for polyphagia and polyuria.  Genitourinary: Positive for urgency.  Musculoskeletal: Positive for arthralgias and neck pain.  Skin: Negative.   Allergic/Immunologic: Positive for environmental allergies.  Neurological: Positive for light-headedness.  Hematological: Negative.   Psychiatric/Behavioral: Negative.     Social History      She  reports that she has never smoked. She has never used smokeless tobacco. She reports that she does not drink alcohol or use drugs.       Social History   Socioeconomic History  . Marital status: Widowed    Spouse name: Not on file  . Number of children: 2  . Years of education: H/S  . Highest education level: Not on file  Occupational History  . Occupation: Development worker, [employer]    Comment: Full-Time  Social Needs  . Financial resource strain: Not on file  . Food insecurity:    Worry: Not on file    Inability: Not on file  . Transportation needs:    Medical: Not on file    Non-medical: Not on file  Tobacco Use  . Smoking status: Never Smoker  .  Smokeless tobacco: Never Used  Substance and Sexual Activity  . Alcohol use: No    Alcohol/week: 0.0 oz  . Drug use: No  . Sexual activity: Not on file  Lifestyle  . Physical activity:    Days per week: Not on file    Minutes per session: Not on file  . Stress: Not on file  Relationships  . Social connections:    Talks on phone: Not on file    Gets together: Not on file    Attends religious service: Not on file    Active member of club or organization: Not on file    Attends meetings of clubs or organizations: Not on file    Relationship status: Not on file  Other Topics Concern  . Not on file  Social History Narrative  . Not on file    Past Medical History:  Diagnosis Date  . Acid reflux 04/02/2001  . Adiposity 04/20/1993  . Benign essential HTN 11/11/1993  . Diabetes mellitus without complication (HCC)    diet   . Heart murmur   . Hypertension   . Syncope and collapse 01/20/2009  . Tinnitus    left ear  . Urticaria 06/04/2015     Patient Active Problem List   Diagnosis Date Noted  . B12 deficiency 03/17/2016  . Allergic rhinitis 02/18/2016  . Urticaria 06/04/2015  . Anxiety 04/29/2015  . Depression, major, single episode, moderate ([place]) 04/29/2015  . Bilateral tinnitus 04/29/2015  . Avitaminosis D 04/29/2015  . Hypertension 03/30/2015  . Hypercholesteremia 03/30/2015  . Cardiovascular  symptoms 01/12/2010  . Syncope and collapse 01/20/2009  . Fam hx-ischem heart disease 11/25/2008  . Acid reflux 04/02/2001    Past Surgical History:  Procedure Laterality Date  . CYSTOSCOPY WITH BIOPSY N/A 12/22/2015   Procedure: CYSTOSCOPY WITH BIOPSY;  Surgeon: [name], MD;  Location: [facility] ORS;  Service: Urology;  Laterality: N/A;  . TUBAL LIGATION  1987  . TUMMY TUCK  06/17/2010    Family History        Family Status  Relation Name Status  . Mother  Deceased [place] age 73       09-Aug-2013  . Father  Deceased       Recently past away 03/2015 secondary to MVA.   Marland Kitchen  Sister 1 Alive  . MGM  Deceased  . MGF  Deceased  . PGM  Deceased  . PGF  Deceased  . Daughter  Alive  . Son  Alive  . Sister 2 Alive        Her family history includes AAA (abdominal aortic aneurysm) in her father; CVA in her paternal grandmother; Congestive Heart Failure in her paternal grandmother; Diabetes in her mother; Glaucoma in her father; Heart attack in her father, maternal grandfather, mother, and paternal grandfather; Hypertension in her father; Obesity in her sister; Stroke in her mother; Throat cancer in her maternal grandmother and mother.      Allergies  Allergen Reactions  . Ibuprofen     Other reaction(s): Abdominal Pain Can't take NSAIDS secondary to erosions due to NSAIDS.  Marland Kitchen Nsaids     Can't take NSAIDS secondary to erosions due to NSAIDS.     Current Outpatient Medications:  .  ALPRAZolam (XANAX) 0.5 MG tablet, TAKE 1 TABLET BY MOUTH EVERY 8 HOURS AS NEEDED, Disp: 30 tablet, Rfl: 5 .  calcium carbonate (OS-CAL - DOSED IN MG OF ELEMENTAL CALCIUM) 1250 (500 Ca) MG tablet, Take 1 tablet by mouth daily with breakfast., Disp: , Rfl:  .  Cholecalciferol 1000 UNITS capsule, Take 500 Units by mouth daily. , Disp: , Rfl:  .  Cinnamon 500 MG capsule, Take 500 mg by mouth daily., Disp: , Rfl:  .  Coenzyme Q10 (COQ10) 200 MG CAPS, Take 1 capsule by mouth daily., Disp: 30 capsule, Rfl: 0 .  COLLAGEN PO, Take 1 tablet by mouth., Disp: , Rfl:  .  cyanocobalamin 1000 MCG tablet, Take 1 tablet by mouth daily., Disp: , Rfl:  .  gabapentin (NEURONTIN) 100 MG capsule, Take 2 capsules by mouth 2 (two) times daily., Disp: , Rfl:  .  hydrocortisone (ANUSOL-HC) 25 MG suppository, Place 1 suppository (25 mg total) rectally 2 (two) times daily., Disp: 12 suppository, Rfl: 0 .  Lactobacillus (PROBIOTIC ACIDOPHILUS PO), Take 1 capsule by mouth daily., Disp: , Rfl:  .  levocetirizine (XYZAL) 5 MG tablet, TAKE 1 TABLET EVERY DAY, Disp: 90 tablet, Rfl: 3 .  mirabegron ER (MYRBETRIQ) 25 MG  TB24 tablet, Take 1 tablet (25 mg total) by mouth daily., Disp: 30 tablet, Rfl: 11 .  montelukast (SINGULAIR) 10 MG tablet, TAKE 1 TABLET BY MOUTH DAILY, Disp: 90 tablet, Rfl: 1 .  oxybutynin (DITROPAN) 5 MG tablet, Take 1 tablet (5 mg total) by mouth every 8 (eight) hours as needed for bladder spasms., Disp: 30 tablet, Rfl: 0 .  pravastatin (PRAVACHOL) 20 MG tablet, TAKE 1 TABLET BY MOUTH [place] BEDTIME, Disp: 90 tablet, Rfl: 1 .  ramipril (ALTACE) 2.5 MG capsule, TAKE 1 CAPSULE BY MOUTH EVERY DAY, Disp: 90 capsule,  Rfl: 1 .  triamcinolone cream (KENALOG) 0.1 %, Apply 1 application topically 2 (two) times daily., Disp: 30 g, Rfl: 0   Patient Care Team: [name], PA-C as PCP - General (Family Medicine)      Objective:   Vitals: BP 130/88 (BP Location: Left Arm, Patient Position: Sitting, Cuff Size: Large)   Pulse 64   Temp 97.9 F (36.6 C) (Oral)   Resp 16   Ht 5\' 4"  (1.626 m)   Wt 214 lb (97.1 kg)   SpO2 96%   BMI 36.73 kg/m    Vitals:   03/27/18 0925  BP: 130/88  Pulse: 64  Resp: 16  Temp: 97.9 F (36.6 C)  TempSrc: Oral  SpO2: 96%  Weight: 214 lb (97.1 kg)  Height: 5\' 4"  (1.626 m)     Physical Exam  Constitutional: She is oriented to person, place, and time. She appears well-developed and well-nourished. No distress.  HENT:  Head: Normocephalic and atraumatic.  Right Ear: Hearing, tympanic membrane, external ear and ear canal normal.  Left Ear: Hearing, tympanic membrane, external ear and ear canal normal.  Nose: Nose normal.  Mouth/Throat: Oropharynx is clear and moist. No oropharyngeal exudate, posterior oropharyngeal edema or posterior oropharyngeal erythema.  Eyes: Pupils are equal, round, and reactive to light. Conjunctivae and EOM are normal. Right eye exhibits no discharge. Left eye exhibits no discharge. No scleral icterus.  Neck: Normal range of motion. Neck supple. No JVD present. No tracheal deviation present. No thyromegaly present.    Cardiovascular: Normal rate, regular rhythm, normal heart sounds and intact distal pulses. Exam reveals no gallop and no friction rub.  No murmur heard. Pulmonary/Chest: Effort normal and breath sounds normal. No respiratory distress. She has no wheezes. She has no rales. She exhibits no tenderness. Right breast exhibits no inverted nipple, no mass, no nipple discharge, no skin change and no tenderness. Left breast exhibits no inverted nipple, no mass, no nipple discharge, no skin change and no tenderness. No breast swelling, tenderness, discharge or bleeding. Breasts are symmetrical.  Abdominal: Soft. Bowel sounds are normal. She exhibits no distension and no mass. There is no tenderness. There is no rebound and no guarding.  Musculoskeletal: Normal range of motion. She exhibits no edema or tenderness.  Lymphadenopathy:    She has no cervical adenopathy.  Neurological: She is alert and oriented to person, place, and time.  Skin: Skin is warm and dry. No rash noted. She is not diaphoretic.  Psychiatric: She has a normal mood and affect. Her behavior is normal. Judgment and thought content normal.  Vitals reviewed.    Depression Screen PHQ 2/9 Scores 03/27/2018 03/22/2017 08/02/2016  PHQ - 2 Score 4 0 0  PHQ- 9 Score 11 6 -      Assessment & Plan:     Routine Health Maintenance and Physical Exam  Exercise Activities and Dietary recommendations Goals    None      Immunization History  Administered Date(s) Administered  . Influenza Inj Mdck Quad Pf 08/23/2017  . Td 10/21/2004  . Tdap 01/12/2010  . Zoster 10/09/2012    Health Maintenance  Topic Date Due  . FOOT EXAM  08/16/1963  . OPHTHALMOLOGY EXAM  08/16/1963  . HEMOGLOBIN A1C  09/22/2017  . PAP SMEAR  03/17/2018  . INFLUENZA VACCINE  05/24/2018  . COLONOSCOPY  02/18/2019  . MAMMOGRAM  04/08/2019  . TETANUS/TDAP  01/13/2020  . Hepatitis C Screening  Completed  . HIV Screening  Completed  Discussed health  benefits of physical activity, and encouraged her to engage in regular exercise appropriate for her age and condition.    1. Annual physical exam Normal physical exam today. Will check labs as below and f/u pending lab results. If labs are stable and WNL she will not need to have these rechecked for one year [place] her next annual physical exam. She is to call the office in the meantime if she has any acute issue, questions or concerns. - CBC w/Diff/Platelet - Comprehensive Metabolic Panel (CMET) - TSH - HgB A1c - Lipid Profile  2. Breast cancer screening Normal breast exam. Mammogram ordered.   3. Diabetes mellitus without complication (HCC) Microalbumin normal. Diet controlled. Will check labs as below and f/u pending results. - POCT UA - Microalbumin - CBC w/Diff/Platelet - Comprehensive Metabolic Panel (CMET) - TSH - HgB A1c - Lipid Profile  4. Essential hypertension Stable. Continue Ramipril 2.5mg . Will check labs as below and f/u pending results. - CBC w/Diff/Platelet - Comprehensive Metabolic Panel (CMET) - TSH - HgB A1c - Lipid Profile  5. Hypercholesteremia Stable on pravastatin 20mg . Will check labs as below and f/u pending results. - CBC w/Diff/Platelet - Comprehensive Metabolic Panel (CMET) - TSH - HgB A1c - Lipid Profile  6. Avitaminosis D H/O this and postmenopausal. Will check labs as below and f/u pending results. - CBC w/Diff/Platelet - Vitamin D (25 hydroxy)  7. B12 deficiency H/O this. Will check labs as below and f/u pending results. - CBC w/Diff/Platelet - B12  8. Neutropenia, unspecified type ([place]) H/O this. No symptoms. Will check labs as below and f/u pending results. - CBC w/Diff/Platelet  9. Class 2 severe obesity due to excess calories with serious comorbidity and body mass index (BMI) of 36.0 to 36.9 in adult [facility]) Counseled patient on healthy lifestyle modifications including dieting and exercise.  - CBC w/Diff/Platelet - Comprehensive  Metabolic Panel (CMET) - TSH - HgB A1c - Lipid Profile  10. Depression, major, single episode, moderate (HCC) Improved. Not on any antidepressants currently.  - TSH  --------------------------------------------------------------------    [name], PA-C  [facility]

## 2018-03-27 NOTE — Patient Instructions (Signed)
Health Maintenance for Postmenopausal Women Menopause is a normal process in which your reproductive ability comes to an end. This process happens gradually over a span of months to years, usually between the ages of 22 and 9. Menopause is complete when you have missed 12 consecutive menstrual periods. It is important to talk with your health care provider about some of the most common conditions that affect postmenopausal women, such as heart disease, cancer, and bone loss (osteoporosis). Adopting a healthy lifestyle and getting preventive care can help to promote your health and wellness. Those actions can also lower your chances of developing some of these common conditions. What should I know about menopause? During menopause, you may experience a number of symptoms, such as:  Moderate-to-severe hot flashes.  Night sweats.  Decrease in sex drive.  Mood swings.  Headaches.  Tiredness.  Irritability.  Memory problems.  Insomnia.  Choosing to treat or not to treat menopausal changes is an individual decision that you make with your health care provider. What should I know about hormone replacement therapy and supplements? Hormone therapy products are effective for treating symptoms that are associated with menopause, such as hot flashes and night sweats. Hormone replacement carries certain risks, especially as you become older. If you are thinking about using estrogen or estrogen with progestin treatments, discuss the benefits and risks with your health care provider. What should I know about heart disease and stroke? Heart disease, heart attack, and stroke become more likely as you age. This may be due, in part, to the hormonal changes that your body experiences during menopause. These can affect how your body processes dietary fats, triglycerides, and cholesterol. Heart attack and stroke are both medical emergencies. There are many things that you can do to help prevent heart disease  and stroke:  Have your blood pressure checked at least every 1-2 years. High blood pressure causes heart disease and increases the risk of stroke.  If you are 53-22 years old, ask your health care provider if you should take aspirin to prevent a heart attack or a stroke.  Do not use any tobacco products, including cigarettes, chewing tobacco, or electronic cigarettes. If you need help quitting, ask your health care provider.  It is important to eat a healthy diet and maintain a healthy weight. ? Be sure to include plenty of vegetables, fruits, low-fat dairy products, and lean protein. ? Avoid eating foods that are high in solid fats, added sugars, or salt (sodium).  Get regular exercise. This is one of the most important things that you can do for your health. ? Try to exercise for at least 150 minutes each week. The type of exercise that you do should increase your heart rate and make you sweat. This is known as moderate-intensity exercise. ? Try to do strengthening exercises at least twice each week. Do these in addition to the moderate-intensity exercise.  Know your numbers.Ask your health care provider to check your cholesterol and your blood glucose. Continue to have your blood tested as directed by your health care provider.  What should I know about cancer screening? There are several types of cancer. Take the following steps to reduce your risk and to catch any cancer development as early as possible. Breast Cancer  Practice breast self-awareness. ? This means understanding how your breasts normally appear and feel. ? It also means doing regular breast self-exams. Let your health care provider know about any changes, no matter how small.  If you are 40  or older, have a clinician do a breast exam (clinical breast exam or CBE) every year. Depending on your age, family history, and medical history, it may be recommended that you also have a yearly breast X-ray (mammogram).  If you  have a family history of breast cancer, talk with your health care provider about genetic screening.  If you are at high risk for breast cancer, talk with your health care provider about having an MRI and a mammogram every year.  Breast cancer (BRCA) gene test is recommended for women who have family members with BRCA-related cancers. Results of the assessment will determine the need for genetic counseling and BRCA1 and for BRCA2 testing. BRCA-related cancers include these types: ? Breast. This occurs in males or females. ? Ovarian. ? Tubal. This may also be called fallopian tube cancer. ? Cancer of the abdominal or pelvic lining (peritoneal cancer). ? Prostate. ? Pancreatic.  Cervical, Uterine, and Ovarian Cancer Your health care provider may recommend that you be screened regularly for cancer of the pelvic organs. These include your ovaries, uterus, and vagina. This screening involves a pelvic exam, which includes checking for microscopic changes to the surface of your cervix (Pap test).  For women ages 21-65, health care providers may recommend a pelvic exam and a Pap test every three years. For women ages 79-65, they may recommend the Pap test and pelvic exam, combined with testing for human papilloma virus (HPV), every five years. Some types of HPV increase your risk of cervical cancer. Testing for HPV may also be done on women of any age who have unclear Pap test results.  Other health care providers may not recommend any screening for nonpregnant women who are considered low risk for pelvic cancer and have no symptoms. Ask your health care provider if a screening pelvic exam is right for you.  If you have had past treatment for cervical cancer or a condition that could lead to cancer, you need Pap tests and screening for cancer for at least 20 years after your treatment. If Pap tests have been discontinued for you, your risk factors (such as having a new sexual partner) need to be  reassessed to determine if you should start having screenings again. Some women have medical problems that increase the chance of getting cervical cancer. In these cases, your health care provider may recommend that you have screening and Pap tests more often.  If you have a family history of uterine cancer or ovarian cancer, talk with your health care provider about genetic screening.  If you have vaginal bleeding after reaching menopause, tell your health care provider.  There are currently no reliable tests available to screen for ovarian cancer.  Lung Cancer Lung cancer screening is recommended for adults 69-62 years old who are at high risk for lung cancer because of a history of smoking. A yearly low-dose CT scan of the lungs is recommended if you:  Currently smoke.  Have a history of at least 30 pack-years of smoking and you currently smoke or have quit within the past 15 years. A pack-year is smoking an average of one pack of cigarettes per day for one year.  Yearly screening should:  Continue until it has been 15 years since you quit.  Stop if you develop a health problem that would prevent you from having lung cancer treatment.  Colorectal Cancer  This type of cancer can be detected and can often be prevented.  Routine colorectal cancer screening usually begins at  age 42 and continues through age 45.  If you have risk factors for colon cancer, your health care provider may recommend that you be screened at an earlier age.  If you have a family history of colorectal cancer, talk with your health care provider about genetic screening.  Your health care provider may also recommend using home test kits to check for hidden blood in your stool.  A small camera at the end of a tube can be used to examine your colon directly (sigmoidoscopy or colonoscopy). This is done to check for the earliest forms of colorectal cancer.  Direct examination of the colon should be repeated every  5-10 years until age 71. However, if early forms of precancerous polyps or small growths are found or if you have a family history or genetic risk for colorectal cancer, you may need to be screened more often.  Skin Cancer  Check your skin from head to toe regularly.  Monitor any moles. Be sure to tell your health care provider: ? About any new moles or changes in moles, especially if there is a change in a mole's shape or color. ? If you have a mole that is larger than the size of a pencil eraser.  If any of your family members has a history of skin cancer, especially at a young age, talk with your health care provider about genetic screening.  Always use sunscreen. Apply sunscreen liberally and repeatedly throughout the day.  Whenever you are outside, protect yourself by wearing long sleeves, pants, a wide-brimmed hat, and sunglasses.  What should I know about osteoporosis? Osteoporosis is a condition in which bone destruction happens more quickly than new bone creation. After menopause, you may be at an increased risk for osteoporosis. To help prevent osteoporosis or the bone fractures that can happen because of osteoporosis, the following is recommended:  If you are 46-71 years old, get at least 1,000 mg of calcium and at least 600 mg of vitamin D per day.  If you are older than age 55 but younger than age 65, get at least 1,200 mg of calcium and at least 600 mg of vitamin D per day.  If you are older than age 54, get at least 1,200 mg of calcium and at least 800 mg of vitamin D per day.  Smoking and excessive alcohol intake increase the risk of osteoporosis. Eat foods that are rich in calcium and vitamin D, and do weight-bearing exercises several times each week as directed by your health care provider. What should I know about how menopause affects my mental health? Depression may occur at any age, but it is more common as you become older. Common symptoms of depression  include:  Low or sad mood.  Changes in sleep patterns.  Changes in appetite or eating patterns.  Feeling an overall lack of motivation or enjoyment of activities that you previously enjoyed.  Frequent crying spells.  Talk with your health care provider if you think that you are experiencing depression. What should I know about immunizations? It is important that you get and maintain your immunizations. These include:  Tetanus, diphtheria, and pertussis (Tdap) booster vaccine.  Influenza every year before the flu season begins.  Pneumonia vaccine.  Shingles vaccine.  Your health care provider may also recommend other immunizations. This information is not intended to replace advice given to you by your health care provider. Make sure you discuss any questions you have with your health care provider. Document Released: 12/02/2005  Document Revised: 04/29/2016 Document Reviewed: 07/14/2015 Elsevier Interactive Patient Education  2018 Elsevier Inc.  

## 2018-03-28 ENCOUNTER — Telehealth: Payer: Self-pay

## 2018-03-28 LAB — COMPREHENSIVE METABOLIC PANEL
A/G RATIO: 2.5 — AB (ref 1.2–2.2)
ALBUMIN: 4.5 g/dL (ref 3.6–4.8)
ALT: 12 IU/L (ref 0–32)
AST: 22 IU/L (ref 0–40)
Alkaline Phosphatase: 70 IU/L (ref 39–117)
BILIRUBIN TOTAL: 0.7 mg/dL (ref 0.0–1.2)
BUN / CREAT RATIO: 25 (ref 12–28)
BUN: 19 mg/dL (ref 8–27)
CALCIUM: 9.2 mg/dL (ref 8.7–10.3)
CHLORIDE: 103 mmol/L (ref 96–106)
CO2: 25 mmol/L (ref 20–29)
Creatinine, Ser: 0.76 mg/dL (ref 0.57–1.00)
GFR, EST AFRICAN AMERICAN: 96 mL/min/{1.73_m2} (ref >59)
GFR, EST NON AFRICAN AMERICAN: 83 mL/min/{1.73_m2} (ref >59)
Globulin, Total: 1.8 g/dL (ref 1.5–4.5)
Glucose: 154 mg/dL — ABNORMAL HIGH (ref 65–99)
POTASSIUM: 4.4 mmol/L (ref 3.5–5.2)
Sodium: 144 mmol/L (ref 134–144)
TOTAL PROTEIN: 6.3 g/dL (ref 6.0–8.5)

## 2018-03-28 LAB — CBC WITH DIFFERENTIAL/PLATELET
BASOS: 1 %
Basophils Absolute: 0 10*3/uL (ref 0.0–0.2)
EOS (ABSOLUTE): 0.1 10*3/uL (ref 0.0–0.4)
Eos: 2 %
HEMOGLOBIN: 13.8 g/dL (ref 11.1–15.9)
Hematocrit: 39.1 % (ref 34.0–46.6)
IMMATURE GRANS (ABS): 0 10*3/uL (ref 0.0–0.1)
IMMATURE GRANULOCYTES: 0 %
LYMPHS: 30 %
Lymphocytes Absolute: 0.9 10*3/uL (ref 0.7–3.1)
MCH: 31.4 pg (ref 26.6–33.0)
MCHC: 35.3 g/dL (ref 31.5–35.7)
MCV: 89 fL (ref 79–97)
Monocytes Absolute: 0.4 10*3/uL (ref 0.1–0.9)
Monocytes: 12 %
NEUTROS ABS: 1.6 10*3/uL (ref 1.4–7.0)
NEUTROS PCT: 55 %
PLATELETS: 156 10*3/uL (ref 150–450)
RBC: 4.4 x10E6/uL (ref 3.77–5.28)
RDW: 13.6 % (ref 12.3–15.4)
WBC: 3 10*3/uL — ABNORMAL LOW (ref 3.4–10.8)

## 2018-03-28 LAB — LIPID PANEL
CHOLESTEROL TOTAL: 175 mg/dL (ref 100–199)
Chol/HDL Ratio: 2.5 ratio (ref 0.0–4.4)
HDL: 70 mg/dL (ref >39)
LDL CALC: 93 mg/dL (ref 0–99)
Triglycerides: 61 mg/dL (ref 0–149)
VLDL CHOLESTEROL CAL: 12 mg/dL (ref 5–40)

## 2018-03-28 LAB — HEMOGLOBIN A1C
ESTIMATED AVERAGE GLUCOSE: 134 mg/dL
Hgb A1c MFr Bld: 6.3 % — ABNORMAL HIGH (ref 4.8–5.6)

## 2018-03-28 LAB — TSH: TSH: 1.34 u[IU]/mL (ref 0.450–4.500)

## 2018-03-28 LAB — VITAMIN B12: Vitamin B-12: 776 pg/mL (ref 232–1245)

## 2018-03-28 LAB — VITAMIN D 25 HYDROXY (VIT D DEFICIENCY, FRACTURES): Vit D, 25-Hydroxy: 44 ng/mL (ref 30.0–100.0)

## 2018-03-28 NOTE — Telephone Encounter (Signed)
Patient advised as below. Patient verbalizes understanding and is in agreement with treatment plan.  

## 2018-03-28 NOTE — Telephone Encounter (Signed)
-----   Message from [name], [place] sent at 03/28/2018  9:16 AM EDT ----- All labs are normal and stable with exception of A1c that has increased to 6.3. Was 6.0 last year. Continue working on lifestyle modifications with healthy dieting and exercise.

## 2018-04-02 ENCOUNTER — Other Ambulatory Visit: Payer: Self-pay | Admitting: Physician Assistant

## 2018-04-02 DIAGNOSIS — F411 Generalized anxiety disorder: Secondary | ICD-10-CM

## 2018-04-10 LAB — HM DIABETES EYE EXAM

## 2018-04-11 ENCOUNTER — Other Ambulatory Visit: Payer: Self-pay | Admitting: Physician Assistant

## 2018-04-11 DIAGNOSIS — K648 Other hemorrhoids: Secondary | ICD-10-CM

## 2018-05-05 ENCOUNTER — Other Ambulatory Visit: Payer: Self-pay | Admitting: Physician Assistant

## 2018-05-05 DIAGNOSIS — E7800 Pure hypercholesterolemia, unspecified: Secondary | ICD-10-CM

## 2018-07-13 ENCOUNTER — Other Ambulatory Visit: Payer: Self-pay | Admitting: Physician Assistant

## 2018-07-13 DIAGNOSIS — K648 Other hemorrhoids: Secondary | ICD-10-CM

## 2018-07-31 ENCOUNTER — Other Ambulatory Visit: Payer: Self-pay | Admitting: Physician Assistant

## 2018-07-31 DIAGNOSIS — L409 Psoriasis, unspecified: Secondary | ICD-10-CM

## 2018-09-30 ENCOUNTER — Other Ambulatory Visit: Payer: Self-pay | Admitting: Physician Assistant

## 2018-09-30 DIAGNOSIS — I10 Essential (primary) hypertension: Secondary | ICD-10-CM

## 2018-10-03 ENCOUNTER — Other Ambulatory Visit: Payer: Self-pay | Admitting: Physician Assistant

## 2018-10-03 DIAGNOSIS — K648 Other hemorrhoids: Secondary | ICD-10-CM

## 2018-10-07 ENCOUNTER — Other Ambulatory Visit: Payer: Self-pay | Admitting: Physician Assistant

## 2018-10-07 DIAGNOSIS — J309 Allergic rhinitis, unspecified: Secondary | ICD-10-CM

## 2018-10-12 ENCOUNTER — Other Ambulatory Visit: Payer: Self-pay | Admitting: Physician Assistant

## 2018-10-12 ENCOUNTER — Telehealth: Payer: Self-pay | Admitting: Physician Assistant

## 2018-10-12 DIAGNOSIS — F411 Generalized anxiety disorder: Secondary | ICD-10-CM

## 2018-10-12 DIAGNOSIS — I10 Essential (primary) hypertension: Secondary | ICD-10-CM

## 2018-10-12 MED ORDER — ALPRAZOLAM 0.5 MG PO TABS: 0.5000 mg | ORAL_TABLET | Freq: Three times a day (TID) | ORAL | 1 refills | Status: DC | PRN

## 2018-10-12 MED ORDER — RAMIPRIL 2.5 MG PO CAPS: ORAL_CAPSULE | ORAL | 1 refills | Status: DC

## 2018-10-12 NOTE — Telephone Encounter (Signed)
Please advise 

## 2018-10-12 NOTE — Telephone Encounter (Signed)
Refilled to [facility] for her

## 2018-10-12 NOTE — Telephone Encounter (Signed)
Patient has been trying to get her Alprazolam and Altace for 2 weeks now.    [facility] says [facility] get it and she wants to change pharmacys to [facility].   Please call in refills to [facility].

## 2018-10-16 DIAGNOSIS — E6609 Other obesity due to excess calories: Secondary | ICD-10-CM | POA: Insufficient documentation

## 2018-10-22 ENCOUNTER — Other Ambulatory Visit: Payer: Self-pay | Admitting: Physician Assistant

## 2018-10-22 DIAGNOSIS — L409 Psoriasis, unspecified: Secondary | ICD-10-CM

## 2018-10-22 MED ORDER — TRIAMCINOLONE ACETONIDE 0.1 % EX CREA: TOPICAL_CREAM | CUTANEOUS | 0 refills | Status: DC

## 2018-10-22 NOTE — Telephone Encounter (Signed)
[facility] faxed refill request for the following medications:  triamcinolone cream (KENALOG) 0.1 %   This is a Sports [employer] patient.  Please advise.

## 2018-11-02 ENCOUNTER — Other Ambulatory Visit: Payer: Self-pay | Admitting: Physician Assistant

## 2018-11-02 DIAGNOSIS — E7800 Pure hypercholesterolemia, unspecified: Secondary | ICD-10-CM

## 2018-11-02 MED ORDER — PRAVASTATIN SODIUM 20 MG PO TABS: ORAL_TABLET | ORAL | 5 refills | Status: DC

## 2018-11-02 NOTE — Telephone Encounter (Signed)
Pt needing a refill on:  pravastatin (PRAVACHOL) 20 MG tablet - haven't taken since mid Dec.  Please fill asap at:  [place], [place] - [place] [phone] (Phone) [phone] (Fax)   Thanks, [employer]

## 2018-11-07 ENCOUNTER — Other Ambulatory Visit: Payer: Self-pay | Admitting: Physician Assistant

## 2018-11-07 DIAGNOSIS — J309 Allergic rhinitis, unspecified: Secondary | ICD-10-CM

## 2018-12-10 ENCOUNTER — Other Ambulatory Visit: Payer: Self-pay | Admitting: Family Medicine

## 2018-12-10 DIAGNOSIS — L409 Psoriasis, unspecified: Secondary | ICD-10-CM

## 2018-12-10 NOTE — Telephone Encounter (Signed)
See Rx request ° °

## 2018-12-19 ENCOUNTER — Ambulatory Visit (INDEPENDENT_AMBULATORY_CARE_PROVIDER_SITE_OTHER): Payer: 59 | Admitting: Physician Assistant

## 2018-12-19 ENCOUNTER — Encounter: Payer: Self-pay | Admitting: Physician Assistant

## 2018-12-19 VITALS — BP 136/82 | HR 72 | Temp 97.9°F | Resp 16 | Ht 63.0 in | Wt 227.0 lb

## 2018-12-19 DIAGNOSIS — I10 Essential (primary) hypertension: Secondary | ICD-10-CM | POA: Diagnosis not present

## 2018-12-19 NOTE — Progress Notes (Signed)
Patient: Virginia Weaver Female    DOB: 07/20/1951   68 y.o.   MRN: [id number] Visit Date: 12/20/2018  Today's Provider: Trinna Post, PA-C   Chief Complaint  Patient presents with  . Hypertension   Subjective:     Hypertension  This is a recurrent problem. The problem has been gradually worsening since onset. The problem is uncontrolled. Associated symptoms include anxiety, headaches and malaise/fatigue. Pertinent negatives include no blurred vision, chest pain, palpitations, peripheral edema or shortness of breath. Agents associated with hypertension include amphetamines. Risk factors for coronary artery disease include obesity.   Patient is currently taking rampiril 2.5 mg daily. 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 She reports she gets her blood pressure checked at work and that her bp today was 180/101. She had an 170/90 BP at podiatry when she was there to get toenail removed. She had a 140/90 reading at cardiology in 09/2018. She reports she does not check her BP at home.  BP Readings from Last 3 Encounters:  12/19/18 136/82  03/27/18 130/88  03/02/18 (!) 146/86    Allergies  Allergen Reactions  . Ibuprofen     Other reaction(s): Abdominal Pain Can't take NSAIDS secondary to erosions due to NSAIDS.  Marland Kitchen Nsaids     Can't take NSAIDS secondary to erosions due to NSAIDS.     Current Outpatient Medications:  .  ALPRAZolam (XANAX) 0.5 MG tablet, Take 1 tablet (0.5 mg total) by mouth every 8 (eight) hours as needed., Disp: 90 tablet, Rfl: 1 .  calcium carbonate (OS-CAL - DOSED IN MG OF ELEMENTAL CALCIUM) 1250 (500 Ca) MG tablet, Take 1 tablet by mouth daily with breakfast., Disp: , Rfl:  .  Cholecalciferol 1000 UNITS capsule, Take 500 Units by mouth daily. , Disp: , Rfl:  .  Cinnamon 500 MG capsule, Take 500 mg by mouth daily., Disp: , Rfl:  .  Coenzyme Q10 (COQ10) 200 MG CAPS, Take 1 capsule by mouth daily., Disp: 30 capsule,  Rfl: 0 .  cyanocobalamin 1000 MCG tablet, Take 1 tablet by mouth daily., Disp: , Rfl:  .  gabapentin (NEURONTIN) 100 MG capsule, Take 2 capsules by mouth 2 (two) times daily., Disp: , Rfl:  .  hydrocortisone (ANUSOL-HC) 25 MG suppository, PLACE 1 SUPPOSITORY (25 MG TOTAL) RECTALLY 2 (TWO) TIMES DAILY., Disp: 12 suppository, Rfl: 0 .  Lactobacillus (PROBIOTIC ACIDOPHILUS PO), Take 1 capsule by mouth daily., Disp: , Rfl:  .  levocetirizine (XYZAL) 5 MG tablet, TAKE 1 TABLET BY MOUTH EVERY DAY, Disp: 30 tablet, Rfl: 11 .  mirabegron ER (MYRBETRIQ) 25 MG TB24 tablet, Take 1 tablet (25 mg total) by mouth daily., Disp: 30 tablet, Rfl: 11 .  montelukast (SINGULAIR) 10 MG tablet, TAKE 1 TABLET BY MOUTH EVERY DAY, Disp: 90 tablet, Rfl: 1 .  pravastatin (PRAVACHOL) 20 MG tablet, TAKE 1 TABLET BY MOUTH EVERYDAY AT BEDTIME, Disp: 30 tablet, Rfl: 5 .  ramipril (ALTACE) 2.5 MG capsule, TAKE 1 CAPSULE BY MOUTH EVERY DAY, Disp: 90 capsule, Rfl: 1 .  triamcinolone cream (KENALOG) 0.1 %, APPLY TO AFFECTED AREAS TWICE DAILY, Disp: 30 g, Rfl: 0  Review of Systems  Constitutional: Positive for fatigue and malaise/fatigue.  Eyes: Negative.  Negative for blurred vision.  Respiratory: Negative for shortness of breath.   Cardiovascular: Negative for chest pain and palpitations.  Endocrine: Negative.   Neurological: Positive for headaches.    Social History  Tobacco Use  . Smoking status: Never Smoker  . Smokeless tobacco: Never Used  Substance Use Topics  . Alcohol use: No    Alcohol/week: 0.0 standard drinks      Objective:   BP 136/82 (BP Location: Left Arm, Patient Position: Sitting, Cuff Size: Large)   Pulse 72   Temp 97.9 F (36.6 C) (Oral)   Resp 16   Ht 5\' 3"  (1.6 m)   Wt 227 lb (103 kg)   BMI 40.21 kg/m  Vitals:   12/19/18 1541  BP: 136/82  Pulse: 72  Resp: 16  Temp: 97.9 F (36.6 C)  TempSrc: Oral  Weight: 227 lb (103 kg)  Height: 5\' 3"  (1.6 m)     Physical  Exam Constitutional:      Appearance: She is obese.  Cardiovascular:     Rate and Rhythm: Normal rate and regular rhythm.     Heart sounds: Normal heart sounds.  Pulmonary:     Effort: Pulmonary effort is normal.     Breath sounds: Normal breath sounds.  Skin:    General: Skin is warm and dry.  Neurological:     Mental Status: She is alert and oriented to person, place, and time. Mental status is at baseline.  Psychiatric:        Mood and Affect: Mood normal.        Behavior: Behavior normal.         Assessment & Plan    1. Essential hypertension  Her BP is controlled today. She has checked her BP at work, which is very stressful, but not regularly at home. I think it is reasonable to check her BP at home or at [facility] and touch base with Tawanna Sat at f/u in June 2020. If her Bps are consistently high then her BP medication can be increased.   The entirety of the information documented in the History of Present Illness, Review of Systems and Physical Exam were personally obtained by me. Portions of this information were initially documented by Lynford Humphrey, CMA and reviewed by me for thoroughness and accuracy.   Return if symptoms worsen or fail to improve.       Trinna Post, PA-C  [facility]

## 2018-12-19 NOTE — Patient Instructions (Signed)

## 2018-12-25 ENCOUNTER — Other Ambulatory Visit: Payer: Self-pay | Admitting: Physician Assistant

## 2018-12-25 DIAGNOSIS — J309 Allergic rhinitis, unspecified: Secondary | ICD-10-CM

## 2018-12-31 ENCOUNTER — Other Ambulatory Visit: Payer: Self-pay | Admitting: Physician Assistant

## 2018-12-31 DIAGNOSIS — F411 Generalized anxiety disorder: Secondary | ICD-10-CM

## 2019-01-01 ENCOUNTER — Other Ambulatory Visit: Payer: Self-pay

## 2019-01-01 DIAGNOSIS — F411 Generalized anxiety disorder: Secondary | ICD-10-CM

## 2019-01-01 MED ORDER — ALPRAZOLAM 0.5 MG PO TABS: 0.5000 mg | ORAL_TABLET | Freq: Three times a day (TID) | ORAL | 0 refills | Status: DC | PRN

## 2019-01-11 ENCOUNTER — Other Ambulatory Visit: Payer: Self-pay | Admitting: Physician Assistant

## 2019-01-11 DIAGNOSIS — K648 Other hemorrhoids: Secondary | ICD-10-CM

## 2019-01-11 MED ORDER — HYDROCORTISONE ACETATE 25 MG RE SUPP: 25.0000 mg | Freq: Two times a day (BID) | RECTAL | 0 refills | Status: DC

## 2019-01-11 NOTE — Telephone Encounter (Signed)
[facility] faxed refill request for the following medications:  hydrocortisone (ANUSOL-HC) 25 MG suppository   Please advise.

## 2019-01-15 ENCOUNTER — Telehealth: Payer: Self-pay | Admitting: Urology

## 2019-01-15 ENCOUNTER — Other Ambulatory Visit: Payer: Self-pay | Admitting: Physician Assistant

## 2019-01-15 DIAGNOSIS — G629 Polyneuropathy, unspecified: Secondary | ICD-10-CM

## 2019-01-15 MED ORDER — MIRABEGRON ER 50 MG PO TB24: 50.0000 mg | ORAL_TABLET | Freq: Every day | ORAL | 11 refills | Status: DC

## 2019-01-15 NOTE — Telephone Encounter (Signed)
Patient notified and RX sen to pharmacy.

## 2019-01-15 NOTE — Telephone Encounter (Signed)
Pt wants refill on Myrbetriq also asking for an increase in dosage. Pt currently taking 25mg  was told at last appt that she could increase to 50mg  Mybetriq if/when needed. Please send Rx to [facility]. Please advise at [phone]

## 2019-01-15 NOTE — Telephone Encounter (Signed)
That's ok, please prescribe Mybetriq 50 mg.  [name], MD

## 2019-01-30 ENCOUNTER — Other Ambulatory Visit: Payer: Self-pay | Admitting: Physician Assistant

## 2019-01-30 DIAGNOSIS — L409 Psoriasis, unspecified: Secondary | ICD-10-CM

## 2019-03-08 ENCOUNTER — Other Ambulatory Visit: Payer: Self-pay | Admitting: Physician Assistant

## 2019-03-08 DIAGNOSIS — K648 Other hemorrhoids: Secondary | ICD-10-CM

## 2019-03-11 NOTE — Telephone Encounter (Signed)
Please review

## 2019-03-15 ENCOUNTER — Other Ambulatory Visit: Payer: Self-pay | Admitting: Physician Assistant

## 2019-03-15 DIAGNOSIS — I10 Essential (primary) hypertension: Secondary | ICD-10-CM

## 2019-03-21 ENCOUNTER — Other Ambulatory Visit: Payer: Self-pay | Admitting: *Deleted

## 2019-03-21 MED ORDER — MIRABEGRON ER 50 MG PO TB24: 50.0000 mg | ORAL_TABLET | Freq: Every day | ORAL | 6 refills | Status: DC

## 2019-03-25 ENCOUNTER — Other Ambulatory Visit: Payer: Self-pay | Admitting: Physician Assistant

## 2019-03-25 DIAGNOSIS — F411 Generalized anxiety disorder: Secondary | ICD-10-CM

## 2019-03-29 ENCOUNTER — Other Ambulatory Visit (HOSPITAL_COMMUNITY)
Admission: RE | Admit: 2019-03-29 | Discharge: 2019-03-29 | Disposition: A | Discharge status: Home / Self Care | Payer: 59 | Source: Ambulatory Visit | Attending: Physician Assistant | Admitting: Physician Assistant

## 2019-03-29 ENCOUNTER — Ambulatory Visit
Admission: RE | Admit: 2019-03-29 | Discharge: 2019-03-29 | Disposition: A | Discharge status: Home / Self Care | Payer: 59 | Source: Ambulatory Visit | Attending: Physician Assistant | Admitting: Physician Assistant

## 2019-03-29 ENCOUNTER — Ambulatory Visit
Admission: RE | Admit: 2019-03-29 | Discharge: 2019-03-29 | Disposition: A | Discharge status: Home / Self Care | Payer: 59 | Attending: Physician Assistant | Admitting: Physician Assistant

## 2019-03-29 ENCOUNTER — Ambulatory Visit (INDEPENDENT_AMBULATORY_CARE_PROVIDER_SITE_OTHER): Payer: 59 | Admitting: Physician Assistant

## 2019-03-29 ENCOUNTER — Encounter: Payer: Self-pay | Admitting: Physician Assistant

## 2019-03-29 ENCOUNTER — Other Ambulatory Visit: Payer: Self-pay

## 2019-03-29 VITALS — BP 131/79 | HR 68 | Temp 97.9°F | Resp 16 | Ht 63.0 in | Wt 224.0 lb

## 2019-03-29 DIAGNOSIS — Z124 Encounter for screening for malignant neoplasm of cervix: Secondary | ICD-10-CM | POA: Insufficient documentation

## 2019-03-29 DIAGNOSIS — Z13820 Encounter for screening for osteoporosis: Secondary | ICD-10-CM

## 2019-03-29 DIAGNOSIS — Z23 Encounter for immunization: Secondary | ICD-10-CM | POA: Diagnosis not present

## 2019-03-29 DIAGNOSIS — Z1239 Encounter for other screening for malignant neoplasm of breast: Secondary | ICD-10-CM | POA: Diagnosis not present

## 2019-03-29 DIAGNOSIS — D709 Neutropenia, unspecified: Secondary | ICD-10-CM

## 2019-03-29 DIAGNOSIS — E7800 Pure hypercholesterolemia, unspecified: Secondary | ICD-10-CM

## 2019-03-29 DIAGNOSIS — Z0000 Encounter for general adult medical examination without abnormal findings: Secondary | ICD-10-CM | POA: Diagnosis not present

## 2019-03-29 DIAGNOSIS — Z6839 Body mass index (BMI) 39.0-39.9, adult: Secondary | ICD-10-CM

## 2019-03-29 DIAGNOSIS — M2550 Pain in unspecified joint: Secondary | ICD-10-CM

## 2019-03-29 DIAGNOSIS — E119 Type 2 diabetes mellitus without complications: Secondary | ICD-10-CM

## 2019-03-29 DIAGNOSIS — E559 Vitamin D deficiency, unspecified: Secondary | ICD-10-CM

## 2019-03-29 DIAGNOSIS — R3129 Other microscopic hematuria: Secondary | ICD-10-CM | POA: Diagnosis not present

## 2019-03-29 DIAGNOSIS — I10 Essential (primary) hypertension: Secondary | ICD-10-CM

## 2019-03-29 DIAGNOSIS — E538 Deficiency of other specified B group vitamins: Secondary | ICD-10-CM

## 2019-03-29 DIAGNOSIS — F321 Major depressive disorder, single episode, moderate: Secondary | ICD-10-CM

## 2019-03-29 DIAGNOSIS — Z1211 Encounter for screening for malignant neoplasm of colon: Secondary | ICD-10-CM

## 2019-03-29 NOTE — Progress Notes (Signed)
Patient: Virginia Weaver, Female    DOB: 02-05-1951, 68 y.o.   MRN: [id number] Visit Date: 03/29/2019  Today's Provider: [name], PA-C   Chief Complaint  Patient presents with  . Annual Exam   Subjective:     Annual physical exam Virginia Weaver is a 68 y.o. female who presents today for health maintenance and complete physical. She feels well. She reports exercising none. She reports she is sleeping fairly well. -----------------------------------------------------------------   Review of Systems  HENT: Positive for sinus pressure, sneezing and tinnitus.   Eyes: Positive for photophobia.  Cardiovascular: Positive for leg swelling.  Gastrointestinal: Positive for nausea.  Endocrine: Positive for polyuria.  Genitourinary: Positive for dysuria and urgency.  Musculoskeletal: Positive for back pain.  Allergic/Immunologic: Positive for environmental allergies.  Neurological: Positive for dizziness.  Hematological: Bruises/bleeds easily.  All other systems reviewed and are negative.   Social History      She  reports that she has never smoked. She has never used smokeless tobacco. She reports that she does not drink alcohol or use drugs.       Social History   Socioeconomic History  . Marital status: Widowed    Spouse name: Not on file  . Number of children: 2  . Years of education: H/S  . Highest education level: Not on file  Occupational History  . Occupation: Development worker, international aid    Comment: Full-Time  Social Needs  . Financial resource strain: Not on file  . Food insecurity:    Worry: Not on file    Inability: Not on file  . Transportation needs:    Medical: Not on file    Non-medical: Not on file  Tobacco Use  . Smoking status: Never Smoker  . Smokeless tobacco: Never Used  Substance and Sexual Activity  . Alcohol use: No    Alcohol/week: 0.0 standard drinks  . Drug use: No  . Sexual activity: Not on file  Lifestyle  . Physical  activity:    Days per week: Not on file    Minutes per session: Not on file  . Stress: Not on file  Relationships  . Social connections:    Talks on phone: Not on file    Gets together: Not on file    Attends religious service: Not on file    Active member of club or organization: Not on file    Attends meetings of clubs or organizations: Not on file    Relationship status: Not on file  Other Topics Concern  . Not on file  Social History Narrative  . Not on file    Past Medical History:  Diagnosis Date  . Acid reflux 04/02/2001  . Adiposity 04/20/1993  . Benign essential HTN 11/11/1993  . Diabetes mellitus without complication (HCC)    diet   . Heart murmur   . Hypertension   . Syncope and collapse 01/20/2009  . Tinnitus    left ear  . Urticaria 06/04/2015     Patient Active Problem List   Diagnosis Date Noted  . Neutropenia ([place]) 03/27/2018  . Class 2 severe obesity due to excess calories with serious comorbidity and body mass index (BMI) of 35.0 to 35.9 in adult ([place]) 03/27/2018  . B12 deficiency 03/17/2016  . Allergic rhinitis 02/18/2016  . Urticaria 06/04/2015  . Anxiety 04/29/2015  . Depression, major, single episode, moderate ([place]) 04/29/2015  . Bilateral tinnitus 04/29/2015  . Avitaminosis D 04/29/2015  .  Hypertension 03/30/2015  . Hypercholesteremia 03/30/2015  . Cardiovascular symptoms 01/12/2010  . Syncope and collapse 01/20/2009  . Fam hx-ischem heart disease 11/25/2008  . Acid reflux 04/02/2001    Past Surgical History:  Procedure Laterality Date  . CYSTOSCOPY WITH BIOPSY N/A 12/22/2015   Procedure: CYSTOSCOPY WITH BIOPSY;  Surgeon: [name], MD;  Location: [facility] ORS;  Service: Urology;  Laterality: N/A;  . TUBAL LIGATION  1987  . TUMMY TUCK  06/17/2010    Family History        Family Status  Relation Name Status  . Mother  Deceased at age 61       2013-08-06  . Father  Deceased       Recently past away 03/2015 secondary to MVA.   Marland Kitchen Sister 1  Alive  . MGM  Deceased  . MGF  Deceased  . PGM  Deceased  . PGF  Deceased  . Daughter  Alive  . Son  Alive  . Sister 2 Alive        Her family history includes AAA (abdominal aortic aneurysm) in her father; CVA in her paternal grandmother; Congestive Heart Failure in her paternal grandmother; Diabetes in her mother; Glaucoma in her father; Heart attack in her father, maternal grandfather, mother, and paternal grandfather; Hypertension in her father; Obesity in her sister; Stroke in her mother; Throat cancer in her maternal grandmother and mother.      Allergies  Allergen Reactions  . Ibuprofen     Other reaction(s): Abdominal Pain Can't take NSAIDS secondary to erosions due to NSAIDS.  Marland Kitchen Nsaids     Can't take NSAIDS secondary to erosions due to NSAIDS.     Current Outpatient Medications:  .  ALPRAZolam (XANAX) 0.5 MG tablet, TAKE ONE TABLET BY MOUTH EVERY 8 HOURS AS NEEDED, Disp: 90 tablet, Rfl: 5 .  calcium carbonate (OS-CAL - DOSED IN MG OF ELEMENTAL CALCIUM) 1250 (500 Ca) MG tablet, Take 1 tablet by mouth daily with breakfast., Disp: , Rfl:  .  Cholecalciferol 1000 UNITS capsule, Take 500 Units by mouth daily. , Disp: , Rfl:  .  Cinnamon 500 MG capsule, Take 500 mg by mouth daily., Disp: , Rfl:  .  Coenzyme Q10 (COQ10) 200 MG CAPS, Take 1 capsule by mouth daily., Disp: 30 capsule, Rfl: 0 .  cyanocobalamin 1000 MCG tablet, Take 1 tablet by mouth daily., Disp: , Rfl:  .  gabapentin (NEURONTIN) 100 MG capsule, TAKE 1 CAPSULE BY MOUTH IN THE MORNING, 1 CAPSULE AT NOON AND 3 CAPSULES NIGHTLY, Disp: 150 capsule, Rfl: 5 .  hydrocortisone (ANUSOL-HC) 25 MG suppository, PLACE 1 SUPPOSITORY RECTALLY TWICE DAILY, Disp: 12 suppository, Rfl: 0 .  Lactobacillus (PROBIOTIC ACIDOPHILUS PO), Take 1 capsule by mouth daily., Disp: , Rfl:  .  levocetirizine (XYZAL) 5 MG tablet, TAKE 1 TABLET BY MOUTH EVERY DAY, Disp: 30 tablet, Rfl: 11 .  mirabegron ER (MYRBETRIQ) 50 MG TB24 tablet, Take 1 tablet (50  mg total) by mouth daily., Disp: 30 tablet, Rfl: 6 .  montelukast (SINGULAIR) 10 MG tablet, TAKE 1 TABLET BY MOUTH DAILY, Disp: 90 tablet, Rfl: 1 .  Omega-3 Fatty Acids (FISH OIL PO), Take by mouth., Disp: , Rfl:  .  pravastatin (PRAVACHOL) 20 MG tablet, TAKE 1 TABLET BY MOUTH EVERYDAY AT BEDTIME, Disp: 30 tablet, Rfl: 5 .  ramipril (ALTACE) 2.5 MG capsule, TAKE 1 CAPSULE BY MOUTH EVERY DAY, Disp: 90 capsule, Rfl: 1 .  triamcinolone cream (KENALOG) 0.1 %, APPLY TO AFFECTED  AREAS TWICE DAILY, Disp: 80 g, Rfl: 1   Patient Care Team: [name], PA-C as PCP - General (Family Medicine)    Objective:    Vitals: BP 131/79 (BP Location: Right Arm, Patient Position: Sitting, Cuff Size: Large)   Pulse 68   Temp 97.9 F (36.6 C) (Oral)   Resp 16   Ht 5\' 3"  (1.6 m)   Wt 224 lb (101.6 kg)   SpO2 99%   BMI 39.68 kg/m    Vitals:   03/29/19 0924  BP: 131/79  Pulse: 68  Resp: 16  Temp: 97.9 F (36.6 C)  TempSrc: Oral  SpO2: 99%  Weight: 224 lb (101.6 kg)  Height: 5\' 3"  (1.6 m)     Physical Exam Vitals signs reviewed.  Constitutional:      General: She is not in acute distress.    Appearance: Normal appearance. She is well-developed. She is obese. She is not ill-appearing or diaphoretic.  HENT:     Head: Normocephalic and atraumatic.     Right Ear: Hearing, tympanic membrane, ear canal and external ear normal.     Left Ear: Hearing, tympanic membrane, ear canal and external ear normal.     Nose: Nose normal.     Mouth/Throat:     Pharynx: Uvula midline. No oropharyngeal exudate.  Eyes:     General: No scleral icterus.       Right eye: No discharge.        Left eye: No discharge.     Conjunctiva/sclera: Conjunctivae normal.     Pupils: Pupils are equal, round, and reactive to light.  Neck:     Musculoskeletal: Normal range of motion and neck supple.     Thyroid: No thyromegaly.     Vascular: No carotid bruit or JVD.     Trachea: No tracheal deviation.   Cardiovascular:     Rate and Rhythm: Normal rate and regular rhythm.     Pulses: Normal pulses.     Heart sounds: Normal heart sounds. No murmur. No friction rub. No gallop.   Pulmonary:     Effort: Pulmonary effort is normal. No respiratory distress.     Breath sounds: Normal breath sounds. No wheezing or rales.  Chest:     Chest wall: No tenderness.     Breasts: Breasts are symmetrical.        Right: No inverted nipple, mass, nipple discharge, skin change or tenderness.        Left: No inverted nipple, mass, nipple discharge, skin change or tenderness.  Abdominal:     General: Bowel sounds are normal. There is no distension.     Palpations: Abdomen is soft. There is no mass.     Tenderness: There is no abdominal tenderness. There is no guarding or rebound.     Hernia: There is no hernia in the left inguinal area.  Genitourinary:    Exam position: Supine.     Labia:        Right: No rash, tenderness, lesion or injury.        Left: No rash, tenderness, lesion or injury.      Vagina: Normal. No signs of injury. No vaginal discharge, erythema, tenderness or bleeding.     Cervix: No cervical motion tenderness, discharge or friability.     Adnexa:        Right: No mass, tenderness or fullness.         Left: No mass, tenderness or fullness.  Rectum: Normal.  Musculoskeletal: Normal range of motion.        General: No tenderness.  Lymphadenopathy:     Cervical: No cervical adenopathy.  Skin:    General: Skin is warm and dry.     Capillary Refill: Capillary refill takes less than 2 seconds.     Findings: No rash.  Neurological:     General: No focal deficit present.     Mental Status: She is alert and oriented to person, place, and time. Mental status is at baseline.     Cranial Nerves: No cranial nerve deficit.     Coordination: Coordination normal.     Deep Tendon Reflexes: Reflexes are normal and symmetric.  Psychiatric:        Mood and Affect: Mood normal.         Behavior: Behavior normal.        Thought Content: Thought content normal.        Judgment: Judgment normal.      Depression Screen PHQ 2/9 Scores 03/29/2019 03/27/2018 03/22/2017 08/02/2016  PHQ - 2 Score 1 4 0 0  PHQ- 9 Score 8 11 6  -       Assessment & Plan:     Routine Health Maintenance and Physical Exam  Exercise Activities and Dietary recommendations Goals   None     Immunization History  Administered Date(s) Administered  . Influenza Inj Mdck Quad Pf 08/23/2017  . Td 10/21/2004  . Tdap 01/12/2010  . Zoster 10/09/2012    Health Maintenance  Topic Date Due  . FOOT EXAM  08/16/1963  . PAP SMEAR-Modifier  03/17/2018  . DEXA SCAN  08/15/2018  . PNA vac Low Risk Adult (1 of 2 - PCV13) 08/15/2018  . HEMOGLOBIN A1C  09/26/2018  . COLONOSCOPY  02/18/2019  . MAMMOGRAM  04/08/2019  . OPHTHALMOLOGY EXAM  04/11/2019  . INFLUENZA VACCINE  05/25/2019  . TETANUS/TDAP  01/13/2020  . Hepatitis C Screening  Completed  . HIV Screening  Completed     Discussed health benefits of physical activity, and encouraged her to engage in regular exercise appropriate for her age and condition.    1. Annual physical exam Normal physical exam today. Will check labs as below and f/u pending lab results. If labs are stable and WNL she will not need to have these rechecked for one year at her next annual physical exam. She is to call the office in the meantime if she has any acute issue, questions or concerns.  2. Breast cancer screening Breast exam today was normal. There is no family history of breast cancer. She does perform regular self breast exams. Mammogram was ordered as below. Information for [facility] Breast clinic was given to patient so she may schedule her mammogram at her convenience. - MM 3D SCREEN BREAST BILATERAL; Future  3. Cervical cancer screening Pap collected today. Will send as below and f/u pending results. - Cytology - PAP  4. Osteoporosis screening Due for  bone density, baseline. - DG Bone Density; Future  5. Essential hypertension Stable. Continue Ramipril 2.5mg  daily. Will check labs as below and f/u pending results. - CBC w/Diff/Platelet - Comprehensive Metabolic Panel (CMET) - HgB A1c - TSH - Lipid Profile  6. Diabetes mellitus without complication (HCC) Stable. Continue Metformin 500mg  BID. Will check labs as below and f/u pending results. - CBC w/Diff/Platelet - Comprehensive Metabolic Panel (CMET) - HgB A1c - TSH - Lipid Profile  7. Avitaminosis D H/O this. Will check labs as  below and f/u pending results. - CBC w/Diff/Platelet - Vitamin D (25 hydroxy)  8. B12 deficiency H/O this. Will check labs as below and f/u pending results. - CBC w/Diff/Platelet - B12 and Folate Panel  9. Hypercholesteremia Stable. Continue Pravastatin 20mg . Will check labs as below and f/u pending results. - CBC w/Diff/Platelet - Comprehensive Metabolic Panel (CMET) - HgB A1c - TSH - Lipid Profile  10. Neutropenia, unspecified type ([place]) H/O this. Will check labs as below and f/u pending results. - CBC w/Diff/Platelet  11. Depression, major, single episode, moderate (HCC) Stable. Patient declines medications.   12. Class 2 severe obesity due to excess calories with serious comorbidity and body mass index (BMI) of 39.0 to 39.9 in adult [facility]) Counseled patient on healthy lifestyle modifications including dieting and exercise.  - CBC w/Diff/Platelet - Comprehensive Metabolic Panel (CMET) - HgB A1c - TSH - Lipid Profile  13. Need for shingles vaccine Shingrix #1 Vaccine given to patient without complications. Patient sat for 15 minutes after administration and was tolerated well without adverse effects. Return in 2 months for #2. - Varicella-zoster vaccine IM (Shingrix)  14. Need for pneumococcal vaccine Pneumococcal 23 Vaccine given to patient without complications. Patient sat for 15 minutes after administration and was tolerated  well without adverse effects. May get Prevnar 13 next year if desired.  - Pneumococcal polysaccharide vaccine 23-valent greater than or equal to 2yo subcutaneous/IM  15. Colon cancer screening Die for screening. No previous h/o polyps. No family history of colon cancer. Cologuard ordered.  - Cologuard  16. Arthralgia, unspecified joint Patient reports multiple joint pains with hands being worst. Patient desires evaluation for RA or autoimmune source vs OA. Will order labs and imaging as below. I will f/u pending results.  - ANA,IFA RA Diag Pnl w/rflx Tit/Patn - DG Hand Complete Left; Future - DG Hand Complete Right; Future  17. Microscopic hematuria Stable.  - Urinalysis, microscopic only  --------------------------------------------------------------------    [name], PA-C  [facility]

## 2019-03-29 NOTE — Patient Instructions (Signed)

## 2019-03-30 LAB — CBC WITH DIFFERENTIAL/PLATELET
Basophils Absolute: 0 10*3/uL (ref 0.0–0.2)
Basos: 1 %
EOS (ABSOLUTE): 0.1 10*3/uL (ref 0.0–0.4)
Eos: 2 %
Hematocrit: 41.2 % (ref 34.0–46.6)
Hemoglobin: 14.4 g/dL (ref 11.1–15.9)
Immature Grans (Abs): 0 10*3/uL (ref 0.0–0.1)
Immature Granulocytes: 0 %
Lymphocytes Absolute: 1.2 10*3/uL (ref 0.7–3.1)
Lymphs: 33 %
MCH: 30.6 pg (ref 26.6–33.0)
MCHC: 35 g/dL (ref 31.5–35.7)
MCV: 88 fL (ref 79–97)
Monocytes Absolute: 0.3 10*3/uL (ref 0.1–0.9)
Monocytes: 9 %
Neutrophils Absolute: 2 10*3/uL (ref 1.4–7.0)
Neutrophils: 55 %
Platelets: 157 10*3/uL (ref 150–450)
RBC: 4.7 x10E6/uL (ref 3.77–5.28)
RDW: 12.1 % (ref 11.7–15.4)
WBC: 3.6 10*3/uL (ref 3.4–10.8)

## 2019-03-30 LAB — COMPREHENSIVE METABOLIC PANEL
ALT: 16 IU/L (ref 0–32)
AST: 22 IU/L (ref 0–40)
Albumin/Globulin Ratio: 2.3 — ABNORMAL HIGH (ref 1.2–2.2)
Albumin: 4.5 g/dL (ref 3.8–4.8)
Alkaline Phosphatase: 73 IU/L (ref 39–117)
BUN/Creatinine Ratio: 26 (ref 12–28)
BUN: 21 mg/dL (ref 8–27)
Bilirubin Total: 0.8 mg/dL (ref 0.0–1.2)
CO2: 25 mmol/L (ref 20–29)
Calcium: 9.7 mg/dL (ref 8.7–10.3)
Chloride: 103 mmol/L (ref 96–106)
Creatinine, Ser: 0.81 mg/dL (ref 0.57–1.00)
GFR calc Af Amer: 88 mL/min/{1.73_m2} (ref >59)
GFR calc non Af Amer: 76 mL/min/{1.73_m2} (ref >59)
Globulin, Total: 2 g/dL (ref 1.5–4.5)
Glucose: 239 mg/dL — ABNORMAL HIGH (ref 65–99)
Potassium: 4.4 mmol/L (ref 3.5–5.2)
Sodium: 143 mmol/L (ref 134–144)
Total Protein: 6.5 g/dL (ref 6.0–8.5)

## 2019-03-30 LAB — LIPID PANEL
Chol/HDL Ratio: 2.7 ratio (ref 0.0–4.4)
Cholesterol, Total: 179 mg/dL (ref 100–199)
HDL: 66 mg/dL (ref >39)
LDL Calculated: 94 mg/dL (ref 0–99)
Triglycerides: 96 mg/dL (ref 0–149)
VLDL Cholesterol Cal: 19 mg/dL (ref 5–40)

## 2019-03-30 LAB — VITAMIN D 25 HYDROXY (VIT D DEFICIENCY, FRACTURES): Vit D, 25-Hydroxy: 44.2 ng/mL (ref 30.0–100.0)

## 2019-03-30 LAB — URINALYSIS, MICROSCOPIC ONLY: Casts: NONE SEEN /lpf

## 2019-03-30 LAB — B12 AND FOLATE PANEL
Folate: >20 ng/mL (ref >3.0)
Vitamin B-12: 483 pg/mL (ref 232–1245)

## 2019-03-30 LAB — TSH: TSH: 1.04 u[IU]/mL (ref 0.450–4.500)

## 2019-03-30 LAB — HEMOGLOBIN A1C
Est. average glucose Bld gHb Est-mCnc: 163 mg/dL
Hgb A1c MFr Bld: 7.3 % — ABNORMAL HIGH (ref 4.8–5.6)

## 2019-04-01 ENCOUNTER — Telehealth: Payer: Self-pay | Admitting: Physician Assistant

## 2019-04-01 DIAGNOSIS — N3000 Acute cystitis without hematuria: Secondary | ICD-10-CM

## 2019-04-01 DIAGNOSIS — E1129 Type 2 diabetes mellitus with other diabetic kidney complication: Secondary | ICD-10-CM

## 2019-04-01 MED ORDER — METFORMIN HCL 500 MG PO TABS: 500.0000 mg | ORAL_TABLET | Freq: Two times a day (BID) | ORAL | 3 refills | Status: DC

## 2019-04-01 MED ORDER — NITROFURANTOIN MONOHYD MACRO 100 MG PO CAPS: 100.0000 mg | ORAL_CAPSULE | Freq: Two times a day (BID) | ORAL | 0 refills | Status: DC

## 2019-04-01 NOTE — Telephone Encounter (Signed)
[facility]

## 2019-04-01 NOTE — Telephone Encounter (Signed)
Patient was advised.  

## 2019-04-01 NOTE — Telephone Encounter (Signed)
-----   Message from Edd Arbour, [place] sent at 04/01/2019 11:15 AM EDT ----- Spoke to pt and advised lab results.  Pt did advise that she has been having lower back pain and some burning with urination.  She would like to have an antibiotic for the UTI and she was also ok with starting the metformin.  Pt uses [facility]

## 2019-04-02 LAB — CYTOLOGY - PAP
Diagnosis: NEGATIVE
HPV: NOT DETECTED

## 2019-04-03 ENCOUNTER — Telehealth: Payer: Self-pay

## 2019-04-03 NOTE — Telephone Encounter (Signed)
Patient was advised.  

## 2019-04-03 NOTE — Telephone Encounter (Signed)
-----   Message from [name], [place] sent at 04/02/2019  5:12 PM EDT ----- Pap is normal, HPV negative.  Will repeat in 3-5 years.

## 2019-04-15 ENCOUNTER — Other Ambulatory Visit: Payer: Self-pay | Admitting: Family Medicine

## 2019-04-15 DIAGNOSIS — K648 Other hemorrhoids: Secondary | ICD-10-CM

## 2019-05-09 ENCOUNTER — Telehealth: Payer: Self-pay

## 2019-05-09 DIAGNOSIS — Z20822 Contact with and (suspected) exposure to covid-19: Secondary | ICD-10-CM

## 2019-05-09 LAB — COLOGUARD: Cologuard: NEGATIVE

## 2019-05-09 NOTE — Telephone Encounter (Signed)
Patient is requesting to be tested for COVID because her job is requiring it. She doesn't have any symptoms.

## 2019-05-09 NOTE — Telephone Encounter (Signed)
-----   Message from [name], PA-C sent at 05/09/2019  4:13 PM EDT ----- Cologuard negative. Repeat in 3 years for colon cancer screen.

## 2019-05-13 ENCOUNTER — Other Ambulatory Visit: Payer: Self-pay | Admitting: Family Medicine

## 2019-05-13 DIAGNOSIS — E7800 Pure hypercholesterolemia, unspecified: Secondary | ICD-10-CM

## 2019-05-15 LAB — NOVEL CORONAVIRUS, NAA: SARS-CoV-2, NAA: NOT DETECTED

## 2019-05-23 ENCOUNTER — Other Ambulatory Visit: Payer: 59

## 2019-05-29 ENCOUNTER — Ambulatory Visit (INDEPENDENT_AMBULATORY_CARE_PROVIDER_SITE_OTHER): Payer: 59 | Admitting: Physician Assistant

## 2019-05-29 ENCOUNTER — Other Ambulatory Visit: Payer: Self-pay

## 2019-05-29 DIAGNOSIS — Z23 Encounter for immunization: Secondary | ICD-10-CM

## 2019-05-29 NOTE — Progress Notes (Signed)
Shingrix Vaccine #2 given to patient without complications. Patient sat for 15 minutes after administration and was tolerated well without adverse effects. 

## 2019-06-28 ENCOUNTER — Other Ambulatory Visit: Payer: Self-pay | Admitting: Physician Assistant

## 2019-06-28 DIAGNOSIS — K648 Other hemorrhoids: Secondary | ICD-10-CM

## 2019-07-09 ENCOUNTER — Other Ambulatory Visit: Payer: Self-pay | Admitting: Physician Assistant

## 2019-07-09 DIAGNOSIS — L409 Psoriasis, unspecified: Secondary | ICD-10-CM

## 2019-08-19 ENCOUNTER — Other Ambulatory Visit: Payer: Self-pay | Admitting: Physician Assistant

## 2019-08-19 DIAGNOSIS — K648 Other hemorrhoids: Secondary | ICD-10-CM

## 2019-09-17 ENCOUNTER — Other Ambulatory Visit: Payer: Self-pay | Admitting: Physician Assistant

## 2019-09-17 DIAGNOSIS — I10 Essential (primary) hypertension: Secondary | ICD-10-CM

## 2019-10-16 ENCOUNTER — Other Ambulatory Visit: Payer: Self-pay | Admitting: Physician Assistant

## 2019-10-16 DIAGNOSIS — E7800 Pure hypercholesterolemia, unspecified: Secondary | ICD-10-CM

## 2019-11-16 ENCOUNTER — Other Ambulatory Visit: Payer: Self-pay | Admitting: Physician Assistant

## 2019-11-16 DIAGNOSIS — J309 Allergic rhinitis, unspecified: Secondary | ICD-10-CM

## 2019-11-16 NOTE — Telephone Encounter (Signed)
Requested Prescriptions  Pending Prescriptions Disp Refills  . levocetirizine (XYZAL) 5 MG tablet [Pharmacy Med Name: LEVOCETIRIZINE DIHYDROCHLORIDE 5 MG] 30 tablet 5    Sig: TAKE 1 TABLET BY MOUTH DAILY     Ear, Nose, and Throat:  Antihistamines Passed - 11/16/2019  9:52 AM      Passed - Valid encounter within last 12 months    Recent Outpatient Visits          7 months ago Annual physical exam   [facility] F[facility] Malling M, [place]   11 months ago Essential hypertension   [place], Wendee Beavers, [place]   1 year ago Annual physical exam   [place], Clearnce Sorrel, [place]   1 year ago Internal hemorrhoid   [place], Clearnce Sorrel, [place]   2 years ago Annual physical exam   [facility] Fenton Malling M, [place]

## 2019-11-18 ENCOUNTER — Other Ambulatory Visit: Payer: Self-pay | Admitting: Physician Assistant

## 2019-11-18 DIAGNOSIS — F411 Generalized anxiety disorder: Secondary | ICD-10-CM

## 2019-12-10 ENCOUNTER — Other Ambulatory Visit: Payer: Self-pay | Admitting: Physician Assistant

## 2019-12-10 DIAGNOSIS — J309 Allergic rhinitis, unspecified: Secondary | ICD-10-CM

## 2019-12-16 ENCOUNTER — Other Ambulatory Visit: Payer: Self-pay

## 2019-12-16 ENCOUNTER — Ambulatory Visit (INDEPENDENT_AMBULATORY_CARE_PROVIDER_SITE_OTHER): Payer: 59 | Admitting: Physician Assistant

## 2019-12-16 ENCOUNTER — Encounter: Payer: Self-pay | Admitting: Physician Assistant

## 2019-12-16 VITALS — BP 138/78 | HR 72 | Temp 95.9°F | Wt 215.0 lb

## 2019-12-16 DIAGNOSIS — L98491 Non-pressure chronic ulcer of skin of other sites limited to breakdown of skin: Secondary | ICD-10-CM | POA: Diagnosis not present

## 2019-12-16 DIAGNOSIS — L409 Psoriasis, unspecified: Secondary | ICD-10-CM

## 2019-12-16 MED ORDER — TRIAMCINOLONE ACETONIDE 0.1 % EX CREA: TOPICAL_CREAM | CUTANEOUS | 5 refills | Status: DC

## 2019-12-16 MED ORDER — CEPHALEXIN 500 MG PO CAPS: 500.0000 mg | ORAL_CAPSULE | Freq: Two times a day (BID) | ORAL | 0 refills | Status: DC

## 2019-12-16 NOTE — Progress Notes (Signed)
Patient: Virginia Weaver Female    DOB: Jan 22, 1951   69 y.o.   MRN: [id number] Visit Date: 12/16/2019  Today's Provider: [name], PA-C   No chief complaint on file.  Subjective:     HPI   Virginia Weaver comes into to the office today with a sore/wound on her buttocks. Noticed a few days ago and not improving. Also having an increase in her psoriasis currently.   Allergies  Allergen Reactions  . Ibuprofen     Other reaction(s): Abdominal Pain Can't take NSAIDS secondary to erosions due to NSAIDS.  Marland Kitchen Nsaids     Can't take NSAIDS secondary to erosions due to NSAIDS.     Current Outpatient Medications:  .  ALPRAZolam (XANAX) 0.5 MG tablet, TAKE ONE TABLET BY MOUTH EVERY EIGHT HOURS AS NEEDED, Disp: 90 tablet, Rfl: 5 .  calcium carbonate (OS-CAL - DOSED IN MG OF ELEMENTAL CALCIUM) 1250 (500 Ca) MG tablet, Take 1 tablet by mouth daily with breakfast., Disp: , Rfl:  .  Cholecalciferol 1000 UNITS capsule, Take 500 Units by mouth daily. , Disp: , Rfl:  .  Cinnamon 500 MG capsule, Take 500 mg by mouth daily., Disp: , Rfl:  .  Coenzyme Q10 (COQ10) 200 MG CAPS, Take 1 capsule by mouth daily., Disp: 30 capsule, Rfl: 0 .  cyanocobalamin 1000 MCG tablet, Take 1 tablet by mouth daily., Disp: , Rfl:  .  gabapentin (NEURONTIN) 100 MG capsule, TAKE 1 CAPSULE BY MOUTH IN THE MORNING, 1 CAPSULE AT NOON AND 3 CAPSULES NIGHTLY, Disp: 150 capsule, Rfl: 5 .  hydrocortisone (ANUSOL-HC) 25 MG suppository, UNWRAP AND INSERT 1 SUPPOSITORY RECALLY TWICE A DAY, Disp: 12 suppository, Rfl: 1 .  Lactobacillus (PROBIOTIC ACIDOPHILUS PO), Take 1 capsule by mouth daily., Disp: , Rfl:  .  levocetirizine (XYZAL) 5 MG tablet, TAKE 1 TABLET BY MOUTH DAILY, Disp: 30 tablet, Rfl: 5 .  metFORMIN (GLUCOPHAGE) 500 MG tablet, Take 1 tablet (500 mg total) by mouth 2 (two) times daily with a meal., Disp: 180 tablet, Rfl: 3 .  mirabegron ER (MYRBETRIQ) 50 MG TB24 tablet, Take 1 tablet (50 mg total) by  mouth daily., Disp: 30 tablet, Rfl: 6 .  montelukast (SINGULAIR) 10 MG tablet, TAKE 1 TABLET BY MOUTH DAILY, Disp: 90 tablet, Rfl: 1 .  nitrofurantoin, macrocrystal-monohydrate, (MACROBID) 100 MG capsule, Take 1 capsule (100 mg total) by mouth 2 (two) times daily., Disp: 14 capsule, Rfl: 0 .  Omega-3 Fatty Acids (FISH OIL PO), Take by mouth., Disp: , Rfl:  .  pravastatin (PRAVACHOL) 20 MG tablet, TAKE ONE TABLET AT BEDTIME, Disp: 90 tablet, Rfl: 1 .  ramipril (ALTACE) 2.5 MG capsule, TAKE 1 CAPSULE BY MOUTH EVERY DAY, Disp: 90 capsule, Rfl: 1 .  triamcinolone cream (KENALOG) 0.1 %, APPLY TO AFFECTED AREAS TWICE DAILY, Disp: 30 g, Rfl: 5  Review of Systems  Constitutional: Negative.   Respiratory: Negative.   Cardiovascular: Negative.   Gastrointestinal: Negative.   Skin: Positive for wound.       Increased psoriasis    Social History   Tobacco Use  . Smoking status: Never Smoker  . Smokeless tobacco: Never Used  Substance Use Topics  . Alcohol use: No    Alcohol/week: 0.0 standard drinks      Objective:   BP 138/78 (BP Location: Left Arm, Patient Position: Sitting, Cuff Size: Large)   Pulse 72   Temp (!) 95.9 F (35.5 C) (Temporal)   Wt  215 lb (97.5 kg)   BMI 38.09 kg/m  Vitals:   12/16/19 1817  BP: 138/78  Pulse: 72  Temp: (!) 95.9 F (35.5 C)  TempSrc: Temporal  Weight: 215 lb (97.5 kg)  Body mass index is 38.09 kg/m.   Physical Exam Vitals reviewed.  Constitutional:      General: She is not in acute distress.    Appearance: Normal appearance. She is well-developed. She is obese. She is not ill-appearing.  HENT:     Head: Normocephalic and atraumatic.  Pulmonary:     Effort: Pulmonary effort is normal. No respiratory distress.  Musculoskeletal:     Cervical back: Normal range of motion and neck supple.  Skin:      Neurological:     Mental Status: She is alert.  Psychiatric:        Mood and Affect: Mood normal.        Behavior: Behavior normal.         Thought Content: Thought content normal.        Judgment: Judgment normal.      No results found for any visits on 12/16/19.     Assessment & Plan    1. Skin ulcer, limited to breakdown of skin ([place]) Noted on left glute near midline. Will treat with Keflex as below. Continue Sitz baths since they have been helping.  - cephALEXin (KEFLEX) 500 MG capsule; Take 1 capsule (500 mg total) by mouth 2 (two) times daily.  Dispense: 14 capsule; Refill: 0  2. Psoriasis Feel her psoriasis may be ultimate cause and irritation. Will give triamcinolone cream for psoriasis. Call if worsening or not improving.  - triamcinolone cream (KENALOG) 0.1 %; Use topically BID prn  Dispense: 80 g; Refill: [place], PA-C  [facility]

## 2019-12-22 ENCOUNTER — Encounter: Payer: Self-pay | Admitting: Physician Assistant

## 2020-02-13 ENCOUNTER — Other Ambulatory Visit: Payer: Self-pay | Admitting: Physician Assistant

## 2020-02-13 DIAGNOSIS — G629 Polyneuropathy, unspecified: Secondary | ICD-10-CM

## 2020-04-02 ENCOUNTER — Encounter: Payer: Self-pay | Admitting: Physician Assistant

## 2020-04-02 ENCOUNTER — Ambulatory Visit (INDEPENDENT_AMBULATORY_CARE_PROVIDER_SITE_OTHER): Payer: 59 | Admitting: Physician Assistant

## 2020-04-02 ENCOUNTER — Other Ambulatory Visit: Payer: Self-pay

## 2020-04-02 VITALS — BP 126/84 | HR 67 | Temp 97.0°F | Resp 16 | Ht 64.0 in | Wt 214.0 lb

## 2020-04-02 DIAGNOSIS — Z87448 Personal history of other diseases of urinary system: Secondary | ICD-10-CM | POA: Diagnosis not present

## 2020-04-02 DIAGNOSIS — I10 Essential (primary) hypertension: Secondary | ICD-10-CM | POA: Diagnosis not present

## 2020-04-02 DIAGNOSIS — Z6836 Body mass index (BMI) 36.0-36.9, adult: Secondary | ICD-10-CM

## 2020-04-02 DIAGNOSIS — Z23 Encounter for immunization: Secondary | ICD-10-CM | POA: Diagnosis not present

## 2020-04-02 DIAGNOSIS — E1129 Type 2 diabetes mellitus with other diabetic kidney complication: Secondary | ICD-10-CM | POA: Diagnosis not present

## 2020-04-02 DIAGNOSIS — E7800 Pure hypercholesterolemia, unspecified: Secondary | ICD-10-CM

## 2020-04-02 DIAGNOSIS — Z1231 Encounter for screening mammogram for malignant neoplasm of breast: Secondary | ICD-10-CM

## 2020-04-02 DIAGNOSIS — Z1329 Encounter for screening for other suspected endocrine disorder: Secondary | ICD-10-CM

## 2020-04-02 DIAGNOSIS — Z0000 Encounter for general adult medical examination without abnormal findings: Secondary | ICD-10-CM | POA: Diagnosis not present

## 2020-04-02 DIAGNOSIS — R809 Proteinuria, unspecified: Secondary | ICD-10-CM

## 2020-04-02 DIAGNOSIS — E2839 Other primary ovarian failure: Secondary | ICD-10-CM

## 2020-04-02 LAB — POCT URINALYSIS DIPSTICK
Bilirubin, UA: NEGATIVE
Blood, UA: NEGATIVE
Glucose, UA: NEGATIVE
Ketones, UA: NEGATIVE
Leukocytes, UA: NEGATIVE
Nitrite, UA: NEGATIVE
Protein, UA: NEGATIVE
Spec Grav, UA: 1.025 (ref 1.010–1.025)
Urobilinogen, UA: 0.2 E.U./dL
pH, UA: 6 (ref 5.0–8.0)

## 2020-04-02 NOTE — Patient Instructions (Addendum)
[facility] at [facility] [place],  [place]  [address] Get Driving Directions Main: [phone]   Health Maintenance, Female Adopting a healthy lifestyle and getting preventive care are important in promoting health and wellness. Ask your health care provider about:  The right schedule for you to have regular tests and exams.  Things you can do on your own to prevent diseases and keep yourself healthy. What should I know about diet, weight, and exercise? Eat a healthy diet   Eat a diet that includes plenty of vegetables, fruits, low-fat dairy products, and lean protein.  Do not eat a lot of foods that are high in solid fats, added sugars, or sodium. Maintain a healthy weight Body mass index (BMI) is used to identify weight problems. It estimates body fat based on height and weight. Your health care provider can help determine your BMI and help you achieve or maintain a healthy weight. Get regular exercise Get regular exercise. This is one of the most important things you can do for your health. Most adults should:  Exercise for at least 150 minutes each week. The exercise should increase your heart rate and make you sweat (moderate-intensity exercise).  Do strengthening exercises at least twice a week. This is in addition to the moderate-intensity exercise.  Spend less time sitting. Even light physical activity can be beneficial. Watch cholesterol and blood lipids Have your blood tested for lipids and cholesterol at 67 years of age, then have this test every 5 years. Have your cholesterol levels checked more often if:  Your lipid or cholesterol levels are high.  You are older than 67 years of age.  You are at high risk for heart disease. What should I know about cancer screening? Depending on your health history and family history, you may need to have cancer screening at various ages. This may include screening for:  Breast  cancer.  Cervical cancer.  Colorectal cancer.  Skin cancer.  Lung cancer. What should I know about heart disease, diabetes, and high blood pressure? Blood pressure and heart disease  High blood pressure causes heart disease and increases the risk of stroke. This is more likely to develop in people who have high blood pressure readings, are of African descent, or are overweight.  Have your blood pressure checked: ? Every 3-5 years if you are 14-32 years of age. ? Every year if you are 48 years old or older. Diabetes Have regular diabetes screenings. This checks your fasting blood sugar level. Have the screening done:  Once every three years after age 63 if you are at a normal weight and have a low risk for diabetes.  More often and at a younger age if you are overweight or have a high risk for diabetes. What should I know about preventing infection? Hepatitis B If you have a higher risk for hepatitis B, you should be screened for this virus. Talk with your health care provider to find out if you are at risk for hepatitis B infection. Hepatitis C Testing is recommended for:  Everyone born from [birth year] through 1963.  Anyone with known risk factors for hepatitis C. Sexually transmitted infections (STIs)  Get screened for STIs, including gonorrhea and chlamydia, if: ? You are sexually active and are younger than 67 years of age. ? You are older than 67 years of age and your health care provider tells you that you are at risk for this type of infection. ? Your sexual activity  has changed since you were last screened, and you are at increased risk for chlamydia or gonorrhea. Ask your health care provider if you are at risk.  Ask your health care provider about whether you are at high risk for HIV. Your health care provider may recommend a prescription medicine to help prevent HIV infection. If you choose to take medicine to prevent HIV, you should first get tested for HIV. You should  then be tested every 3 months for as long as you are taking the medicine. Pregnancy  If you are about to stop having your period (premenopausal) and you may become pregnant, seek counseling before you get pregnant.  Take 400 to 800 micrograms (mcg) of folic acid every day if you become pregnant.  Ask for birth control (contraception) if you want to prevent pregnancy. Osteoporosis and menopause Osteoporosis is a disease in which the bones lose minerals and strength with aging. This can result in bone fractures. If you are 26 years old or older, or if you are at risk for osteoporosis and fractures, ask your health care provider if you should:  Be screened for bone loss.  Take a calcium or vitamin D supplement to lower your risk of fractures.  Be given hormone replacement therapy (HRT) to treat symptoms of menopause. Follow these instructions at home: Lifestyle  Do not use any products that contain nicotine or tobacco, such as cigarettes, e-cigarettes, and chewing tobacco. If you need help quitting, ask your health care provider.  Do not use street drugs.  Do not share needles.  Ask your health care provider for help if you need support or information about quitting drugs. Alcohol use  Do not drink alcohol if: ? Your health care provider tells you not to drink. ? You are pregnant, may be pregnant, or are planning to become pregnant.  If you drink alcohol: ? Limit how much you use to 0-1 drink a day. ? Limit intake if you are breastfeeding.  Be aware of how much alcohol is in your drink. In the [place] one drink equals one 12 oz bottle of beer (355 mL), one 5 oz glass of wine (148 mL), or one 1 oz glass of hard liquor (44 mL). General instructions  Schedule regular health, dental, and eye exams.  Stay current with your vaccines.  Tell your health care provider if: ? You often feel depressed. ? You have ever been abused or do not feel safe at home. Summary  Adopting a  healthy lifestyle and getting preventive care are important in promoting health and wellness.  Follow your health care provider's instructions about healthy diet, exercising, and getting tested or screened for diseases.  Follow your health care provider's instructions on monitoring your cholesterol and blood pressure. This information is not intended to replace advice given to you by your health care provider. Make sure you discuss any questions you have with your health care provider. Document Revised: 10/03/2018 Document Reviewed: 10/03/2018 Elsevier Patient Education  2020 [employer].

## 2020-04-02 NOTE — Progress Notes (Signed)
Complete physical exam   Patient: Virginia Weaver   DOB: 1951-01-18   69 y.o. Female  MRN: [id number] Visit Date: 04/02/2020  Today's healthcare provider: [name], PA-C   Chief Complaint  Patient presents with  . Annual Exam   Subjective    Virginia Weaver is a 69 y.o. female who presents today for a complete physical exam.  She reports consuming a general diet. The patient does not participate in regular exercise at present. She generally feels well. She reports sleeping well. She does not have additional problems to discuss today.  HPI   Past Medical History:  Diagnosis Date  . Acid reflux 04/02/2001  . Adiposity 04/20/1993  . Benign essential HTN 11/11/1993  . Diabetes mellitus without complication (HCC)    diet   . Heart murmur   . Hypertension   . Syncope and collapse 01/20/2009  . Tinnitus    left ear  . Urticaria 06/04/2015   Past Surgical History:  Procedure Laterality Date  . CYSTOSCOPY WITH BIOPSY N/A 12/22/2015   Procedure: CYSTOSCOPY WITH BIOPSY;  Surgeon: [name], MD;  Location: [facility] ORS;  Service: Urology;  Laterality: N/A;  . TUBAL LIGATION  1987  . TUMMY TUCK  06/17/2010   Social History   Socioeconomic History  . Marital status: Widowed    Spouse name: Not on file  . Number of children: 2  . Years of education: H/S  . Highest education level: Not on file  Occupational History  . Occupation: Development worker, international [employer]    Comment: Full-Time  Tobacco Use  . Smoking status: Never Smoker  . Smokeless tobacco: Never Used  Vaping Use  . Vaping Use: Never used  Substance and Sexual Activity  . Alcohol use: No    Alcohol/week: 0.0 standard drinks  . Drug use: No  . Sexual activity: Not on file  Other Topics Concern  . Not on file  Social History Narrative  . Not on file   Social Determinants of Health   Financial Resource Strain:   . Difficulty of Paying Living Expenses:   Food Insecurity:   . Worried About Ship broker in the Last Year:   . Arboriculturist in the Last Year:   Transportation Needs:   . Film/video editor (Medical):   Marland Kitchen Lack of Transportation (Non-Medical):   Physical Activity:   . Days of Exercise per Week:   . Minutes of Exercise per Session:   Stress:   . Feeling of Stress :   Social Connections:   . Frequency of Communication with Friends and Family:   . Frequency of Social Gatherings with Friends and Family:   . Attends Religious Services:   . Active Member of Clubs or Organizations:   . Attends Archivist Meetings:   Marland Kitchen Marital Status:   Intimate Partner Violence:   . Fear of Current or Ex-Partner:   . Emotionally Abused:   Marland Kitchen Physically Abused:   . Sexually Abused:    Family Status  Relation Name Status  . Mother  Deceased at age 49       August 08, 2013  . Father  Deceased       Recently past away 03/2015 secondary to MVA.   Marland Kitchen Sister 1 Alive  . MGM  Deceased  . MGF  Deceased  . PGM  Deceased  . PGF  Deceased  . Daughter  Alive  . Son  Alive  . Sister  2 Alive   Family History  Problem Relation Age of Onset  . Heart attack Mother   . Stroke Mother   . Diabetes Mother   . Throat cancer Mother   . Heart attack Father   . Hypertension Father   . Glaucoma Father   . AAA (abdominal aortic aneurysm) Father   . Obesity Sister   . Throat cancer Maternal Grandmother   . Heart attack Maternal Grandfather   . CVA Paternal Grandmother   . Congestive Heart Failure Paternal Grandmother   . Heart attack Paternal Grandfather    Allergies  Allergen Reactions  . Ibuprofen     Other reaction(s): Abdominal Pain Can't take NSAIDS secondary to erosions due to NSAIDS.  Marland Kitchen Nsaids     Can't take NSAIDS secondary to erosions due to NSAIDS.    Patient Care Team: Rubye Beach as PCP - General (Family Medicine)   Medications: Outpatient Medications Prior to Visit  Medication Sig  . ALPRAZolam (XANAX) 0.5 MG tablet TAKE ONE TABLET BY MOUTH EVERY  EIGHT HOURS AS NEEDED  . calcium carbonate (OS-CAL - DOSED IN MG OF ELEMENTAL CALCIUM) 1250 (500 Ca) MG tablet Take 1 tablet by mouth daily with breakfast.  . Cholecalciferol 1000 UNITS capsule Take 500 Units by mouth daily.   . Cinnamon 500 MG capsule Take 500 mg by mouth daily.  . Coenzyme Q10 (COQ10) 200 MG CAPS Take 1 capsule by mouth daily.  . cyanocobalamin 1000 MCG tablet Take 1 tablet by mouth daily.  Marland Kitchen gabapentin (NEURONTIN) 100 MG capsule TAKE 1 CAPSULE EVERY MORNING  TAKE 1 CAPSULE AT NOON  AND TAKE 3 CAPSULES AT BEDTIME  . hydrocortisone (ANUSOL-HC) 25 MG suppository UNWRAP AND INSERT 1 SUPPOSITORY RECALLY TWICE A DAY  . Lactobacillus (PROBIOTIC ACIDOPHILUS PO) Take 1 capsule by mouth daily.  Marland Kitchen levocetirizine (XYZAL) 5 MG tablet TAKE 1 TABLET BY MOUTH DAILY  . metFORMIN (GLUCOPHAGE) 500 MG tablet Take 1 tablet (500 mg total) by mouth 2 (two) times daily with a meal.  . mirabegron ER (MYRBETRIQ) 50 MG TB24 tablet Take 1 tablet (50 mg total) by mouth daily.  . montelukast (SINGULAIR) 10 MG tablet TAKE 1 TABLET BY MOUTH DAILY  . nitrofurantoin, macrocrystal-monohydrate, (MACROBID) 100 MG capsule Take 1 capsule (100 mg total) by mouth 2 (two) times daily.  . Omega-3 Fatty Acids (FISH OIL PO) Take by mouth.  . pravastatin (PRAVACHOL) 20 MG tablet TAKE ONE TABLET AT BEDTIME  . ramipril (ALTACE) 2.5 MG capsule TAKE 1 CAPSULE BY MOUTH EVERY DAY  . triamcinolone cream (KENALOG) 0.1 % Use topically BID prn  . cephALEXin (KEFLEX) 500 MG capsule Take 1 capsule (500 mg total) by mouth 2 (two) times daily.   No facility-administered medications prior to visit.    Review of Systems  Constitutional: Negative.   HENT: Positive for postnasal drip, rhinorrhea and tinnitus.   Eyes: Negative.   Respiratory: Negative.   Cardiovascular: Positive for leg swelling.  Gastrointestinal: Negative.   Endocrine: Negative.   Genitourinary: Positive for dysuria.  Musculoskeletal: Negative.   Skin:  Negative.   Allergic/Immunologic: Positive for environmental allergies.  Neurological: Negative.   Hematological: Negative.   Psychiatric/Behavioral: Negative.     Last CBC Lab Results  Component Value Date   WBC 3.6 03/29/2019   HGB 14.4 03/29/2019   HCT 41.2 03/29/2019   MCV 88 03/29/2019   MCH 30.6 03/29/2019   RDW 12.1 03/29/2019   PLT 157 82/50/5397   Last metabolic panel  Lab Results  Component Value Date   GLUCOSE 239 (H) 03/29/2019   NA 143 03/29/2019   K 4.4 03/29/2019   CL 103 03/29/2019   CO2 25 03/29/2019   BUN 21 03/29/2019   CREATININE 0.81 03/29/2019   GFRNONAA 76 03/29/2019   GFRAA 88 03/29/2019   CALCIUM 9.7 03/29/2019   PROT 6.5 03/29/2019   ALBUMIN 4.5 03/29/2019   LABGLOB 2.0 03/29/2019   AGRATIO 2.3 (H) 03/29/2019   BILITOT 0.8 03/29/2019   ALKPHOS 73 03/29/2019   AST 22 03/29/2019   ALT 16 03/29/2019      Objective    BP 126/84 (BP Location: Left Arm, Patient Position: Sitting, Cuff Size: Large)   Pulse 67   Temp (!) 97 F (36.1 C) (Temporal)   Resp 16   Ht 5\' 4"  (1.626 m)   Wt 214 lb (97.1 kg)   BMI 36.73 kg/m  BP Readings from Last 3 Encounters:  04/02/20 126/84  12/16/19 138/78  03/29/19 131/79   Wt Readings from Last 3 Encounters:  04/02/20 214 lb (97.1 kg)  12/16/19 215 lb (97.5 kg)  03/29/19 224 lb (101.6 kg)      Physical Exam Vitals reviewed.  Constitutional:      General: She is not in acute distress.    Appearance: Normal appearance. She is well-developed. She is obese. She is not ill-appearing or diaphoretic.  HENT:     Head: Normocephalic and atraumatic.     Right Ear: Tympanic membrane, ear canal and external ear normal.     Left Ear: Tympanic membrane, ear canal and external ear normal.     Mouth/Throat:     Mouth: Mucous membranes are moist.     Pharynx: Oropharynx is clear. No oropharyngeal exudate or posterior oropharyngeal erythema.  Eyes:     General: No scleral icterus.       Right eye: No  discharge.        Left eye: No discharge.     Extraocular Movements: Extraocular movements intact.     Conjunctiva/sclera: Conjunctivae normal.     Pupils: Pupils are equal, round, and reactive to light.  Neck:     Thyroid: No thyromegaly.     Vascular: No carotid bruit or JVD.     Trachea: No tracheal deviation.  Cardiovascular:     Rate and Rhythm: Normal rate and regular rhythm.     Pulses: Normal pulses.     Heart sounds: Normal heart sounds. No murmur heard.  No friction rub. No gallop.   Pulmonary:     Effort: Pulmonary effort is normal. No respiratory distress.     Breath sounds: Normal breath sounds. No wheezing or rales.  Chest:     Chest wall: No tenderness.  Abdominal:     General: Abdomen is flat. Bowel sounds are normal. There is no distension.     Palpations: Abdomen is soft. There is no mass.     Tenderness: There is no abdominal tenderness. There is no guarding or rebound.  Musculoskeletal:        General: No tenderness. Normal range of motion.     Cervical back: Normal range of motion and neck supple.     Right lower leg: No edema.     Left lower leg: No edema.  Lymphadenopathy:     Cervical: No cervical adenopathy.  Skin:    General: Skin is warm and dry.     Capillary Refill: Capillary refill takes less than 2 seconds.  Findings: No rash.  Neurological:     General: No focal deficit present.     Mental Status: She is alert and oriented to person, place, and time. Mental status is at baseline.  Psychiatric:        Mood and Affect: Mood normal.        Behavior: Behavior normal.        Thought Content: Thought content normal.        Judgment: Judgment normal.      Depression Screen  PHQ 2/9 Scores 04/02/2020 03/29/2019 03/27/2018  PHQ - 2 Score 0 1 4  PHQ- 9 Score 0 8 11    No results found for any visits on 04/02/20.  Assessment & Plan    Routine Health Maintenance and Physical Exam  Exercise Activities and Dietary recommendations Goals    None     Immunization History  Administered Date(s) Administered  . Influenza Inj Mdck Quad Pf 08/23/2017  . Moderna SARS-COVID-2 Vaccination 11/05/2019  . Pneumococcal Polysaccharide-23 03/29/2019  . Td 10/21/2004  . Tdap 01/12/2010  . Zoster 10/09/2012  . Zoster Recombinat (Shingrix) 03/29/2019, 05/29/2019    Health Maintenance  Topic Date Due  . FOOT EXAM  Never done  . DEXA SCAN  Never done  . MAMMOGRAM  04/08/2019  . OPHTHALMOLOGY EXAM  04/11/2019  . HEMOGLOBIN A1C  09/28/2019  . COVID-19 Vaccine (2 - Moderna 2-dose series) 12/03/2019  . TETANUS/TDAP  01/13/2020  . PNA vac Low Risk Adult (2 of 2 - PCV13) 03/28/2020  . INFLUENZA VACCINE  05/24/2020  . Fecal DNA (Cologuard)  04/30/2022  . Hepatitis C Screening  Completed    Discussed health benefits of physical activity, and encouraged her to engage in regular exercise appropriate for her age and condition.  1. Routine general medical examination at a health care facility Normal physical exam today. Will check labs as below and f/u pending lab results. If labs are stable and WNL she will not need to have these rechecked for one year at her next annual physical exam. She is to call the office in the meantime if she has any acute issue, questions or concerns. - Comprehensive metabolic panel - CBC with Differential/Platelet  2. Screening for thyroid disorder Will check labs as below and f/u pending results. - TSH  3. Type 2 diabetes mellitus with microalbuminuria, without long-term current use of insulin (HCC) Stable. Continue metformin 500mg  BID. Will check labs as below and f/u pending results. - Hemoglobin A1c - Urine Microalbumin w/creat. ratio  4. Essential hypertension Stable. Continue Ramipril 2.5mg . Will check labs as below and f/u pending results. - Comprehensive metabolic panel - Lipid panel - TSH - Hemoglobin A1c  5. Hypercholesteremia Stable. Continue Pravastatin 20mg . Will check labs as below and  f/u pending results. - Lipid panel  6. Breast cancer screening by mammogram Breast exam today was normal. There is no family history of breast cancer. She does perform regular self breast exams. Mammogram was ordered as below. Information for [facility] Breast clinic was given to patient so she may schedule her mammogram at her convenience. - MM 3D Screening Breast Bilateral - [place]; Future  7. Estrogen deficiency Due for osteoporosis screenings. - DG Bone density [place]; Future  8. Class 2 severe obesity due to excess calories with serious comorbidity and body mass index (BMI) of 36.0 to 36.9 in adult [facility]) Counseled patient on healthy lifestyle modifications including dieting and exercise.   9. Need for vaccination against Streptococcus pneumoniae  Prevnar 13 Vaccine given to patient without complications. Patient sat for 15 minutes after administration and was tolerated well without adverse effects. - Pneumococcal conjugate vaccine 13-valent IM  10. Need for vaccine for Td (tetanus-diphtheria) Td booster Vaccine given to patient without complications. Patient sat for 15 minutes after administration and was tolerated well without adverse effects. - Td vaccine greater than or equal to 7yo IM  11. History of hematuria Has known bladder lesion, has been followed by Urology. Will test urine for patient for her to have prior to appt.  - POCT Urinalysis Dipstick - Urinalysis, microscopic only   No follow-ups on file.     Reynolds Bowl, PA-C, have reviewed all documentation for this visit. The documentation on 04/02/20 for the exam, diagnosis, procedures, and orders are all accurate and complete.   Rubye Beach  [facility] [phone] (phone) [phone] (fax)  [place]

## 2020-04-03 LAB — CBC WITH DIFFERENTIAL/PLATELET
Basophils Absolute: 0 10*3/uL (ref 0.0–0.2)
Basos: 1 %
EOS (ABSOLUTE): 0.3 10*3/uL (ref 0.0–0.4)
Eos: 6 %
Hematocrit: 39.7 % (ref 34.0–46.6)
Hemoglobin: 13.4 g/dL (ref 11.1–15.9)
Immature Grans (Abs): 0 10*3/uL (ref 0.0–0.1)
Immature Granulocytes: 0 %
Lymphocytes Absolute: 1.4 10*3/uL (ref 0.7–3.1)
Lymphs: 34 %
MCH: 30.8 pg (ref 26.6–33.0)
MCHC: 33.8 g/dL (ref 31.5–35.7)
MCV: 91 fL (ref 79–97)
Monocytes Absolute: 0.4 10*3/uL (ref 0.1–0.9)
Monocytes: 10 %
Neutrophils Absolute: 2 10*3/uL (ref 1.4–7.0)
Neutrophils: 49 %
Platelets: 174 10*3/uL (ref 150–450)
RBC: 4.35 x10E6/uL (ref 3.77–5.28)
RDW: 12.5 % (ref 11.7–15.4)
WBC: 4 10*3/uL (ref 3.4–10.8)

## 2020-04-03 LAB — URINALYSIS, MICROSCOPIC ONLY
Bacteria, UA: NONE SEEN
Casts: NONE SEEN /lpf
RBC, Urine: NONE SEEN /hpf (ref 0–2)

## 2020-04-03 LAB — LIPID PANEL
Chol/HDL Ratio: 2.6 ratio (ref 0.0–4.4)
Cholesterol, Total: 174 mg/dL (ref 100–199)
HDL: 67 mg/dL (ref >39)
LDL Chol Calc (NIH): 89 mg/dL (ref 0–99)
Triglycerides: 99 mg/dL (ref 0–149)
VLDL Cholesterol Cal: 18 mg/dL (ref 5–40)

## 2020-04-03 LAB — COMPREHENSIVE METABOLIC PANEL
ALT: 10 IU/L (ref 0–32)
AST: 21 IU/L (ref 0–40)
Albumin/Globulin Ratio: 2.2 (ref 1.2–2.2)
Albumin: 4.3 g/dL (ref 3.8–4.8)
Alkaline Phosphatase: 73 IU/L (ref 48–121)
BUN/Creatinine Ratio: 22 (ref 12–28)
BUN: 15 mg/dL (ref 8–27)
Bilirubin Total: 0.6 mg/dL (ref 0.0–1.2)
CO2: 25 mmol/L (ref 20–29)
Calcium: 9.7 mg/dL (ref 8.7–10.3)
Chloride: 104 mmol/L (ref 96–106)
Creatinine, Ser: 0.68 mg/dL (ref 0.57–1.00)
GFR calc Af Amer: 105 mL/min/{1.73_m2} (ref >59)
GFR calc non Af Amer: 91 mL/min/{1.73_m2} (ref >59)
Globulin, Total: 2 g/dL (ref 1.5–4.5)
Glucose: 119 mg/dL — ABNORMAL HIGH (ref 65–99)
Potassium: 4 mmol/L (ref 3.5–5.2)
Sodium: 142 mmol/L (ref 134–144)
Total Protein: 6.3 g/dL (ref 6.0–8.5)

## 2020-04-03 LAB — MICROALBUMIN / CREATININE URINE RATIO
Creatinine, Urine: 158.1 mg/dL
Microalb/Creat Ratio: 22 mg/g creat (ref 0–29)
Microalbumin, Urine: 35 ug/mL

## 2020-04-03 LAB — HEMOGLOBIN A1C
Est. average glucose Bld gHb Est-mCnc: 128 mg/dL
Hgb A1c MFr Bld: 6.1 % — ABNORMAL HIGH (ref 4.8–5.6)

## 2020-04-03 LAB — TSH: TSH: 1.07 u[IU]/mL (ref 0.450–4.500)

## 2020-04-06 ENCOUNTER — Telehealth: Payer: Self-pay

## 2020-04-06 NOTE — Telephone Encounter (Signed)
Patient advised as directed below. 

## 2020-04-06 NOTE — Telephone Encounter (Signed)
-----   Message from [name], [place] sent at 04/06/2020  7:37 AM EDT ----- Urine microalbumin is normal. No red blood cells were seen in urine, but did show some calcium oxalate crystals meaning may have kidney stone. Otherwise normal. Kidney and liver function are normal. Sodium, potassium and calcium are normal. Cholesterol is normal. Thyroid is normal. Blood count is normal. A1c has improved greatly from 7.3 to now 6.1! Keep up the good work.

## 2020-04-21 ENCOUNTER — Other Ambulatory Visit: Payer: Self-pay | Admitting: Physician Assistant

## 2020-04-21 DIAGNOSIS — K648 Other hemorrhoids: Secondary | ICD-10-CM

## 2020-04-21 NOTE — Telephone Encounter (Signed)
Requested medication (s) are due for refill today: yes  Requested medication (s) are on the active medication list: yes  Last refill:  08/19/19  Future visit scheduled: no  Notes to clinic:  medication not assigned to a protocol   Requested Prescriptions  Pending Prescriptions Disp Refills   hydrocortisone (ANUSOL-HC) 25 MG suppository [Pharmacy Med Name: HYDROCORTISONE ACETATE 25 MG RECTAL] 12 suppository 1    Sig: UNWRAP AND INSERT 1 SUPPOSITORY RECALLY TWICE A DAY      Off-Protocol Failed - 04/21/2020 12:25 PM      Failed - Medication not assigned to a protocol, review manually.      Passed - Valid encounter within last 12 months    Recent Outpatient Visits           2 weeks ago Routine general medical examination at a health care facility   [place], [place], [place]   4 months ago Skin ulcer, limited to breakdown of skin [facility])   [place], Clearnce Sorrel, [place]   1 year ago Annual physical exam   [place], Clearnce Sorrel, [place]   1 year ago Essential hypertension   [facility] Trinna Post, [place]   2 years ago Annual physical exam   [facility] [name], [place]

## 2020-04-23 ENCOUNTER — Other Ambulatory Visit: Payer: Self-pay | Admitting: Urology

## 2020-04-28 NOTE — Progress Notes (Addendum)
04/29/2020 3:54 PM   [name] 17-Mar-1951 [id number]  Referring provider: [name], PA-C [address] [place],  [place] [address] Chief Complaint  Patient presents with  . chronic cystitis    HPI: Virginia Weaver is a 69 y.o. female with chronic cystitis and OAB presents today for a 1 year follow up.   Urinalysis on 04/02/2020 showed crystals present and WBC 6-10. Dipstick was normal. Urine microalbumin with creatinine ratio was normal.   She is concerned today about the presence of calcium oxalate crystals.  She has no personal history of kidney stones or no flank pain.  PVR was 0 mL today.  Her bladder symptoms ar the same. She has continued urgency but still believes that Myrbetriq made a significant difference in her overall urinary symptoms.  She does think that she could be doing better than currently.  She is currently on 50 mg.  . She denies have any accidents but she is worried she may urinate. She does wear pads for safety during public outings.   She has some episodes of burning that feels like blades.  Previous cytologies have been negative.  No microscopic hematuria.  These episodes come and go and are relatively random.  They last no more than a day or 2.  They resolve spontaneously.  They can be improved with Pyridium.    She feels like she is emptying her bladder well.  She has had no infection in the past year.   PMH: Past Medical History:  Diagnosis Date  . Acid reflux 04/02/2001  . Adiposity 04/20/1993  . Benign essential HTN 11/11/1993  . Diabetes mellitus without complication (HCC)    diet   . Heart murmur   . Hypertension   . Syncope and collapse 01/20/2009  . Tinnitus    left ear  . Urticaria 06/04/2015    Surgical History: Past Surgical History:  Procedure Laterality Date  . CYSTOSCOPY WITH BIOPSY N/A 12/22/2015   Procedure: CYSTOSCOPY WITH BIOPSY;  Surgeon: [name], MD;  Location: [facility] ORS;  Service:  Urology;  Laterality: N/A;  . TUBAL LIGATION  1987  . TUMMY TUCK  06/17/2010    Home Medications:  Allergies as of 04/29/2020      Reactions   Ibuprofen    Other reaction(s): Abdominal Pain Can't take NSAIDS secondary to erosions due to NSAIDS.   Nsaids    Can't take NSAIDS secondary to erosions due to NSAIDS.      Medication List       Accurate as of April 29, 2020 11:59 PM. If you have any questions, ask your nurse or doctor.        ALPRAZolam 0.5 MG tablet Commonly known as: XANAX TAKE ONE TABLET BY MOUTH EVERY EIGHT HOURS AS NEEDED   calcium carbonate 1250 (500 Ca) MG tablet Commonly known as: OS-CAL - dosed in mg of elemental calcium Take 1 tablet by mouth daily with breakfast.   Cholecalciferol 25 MCG (1000 UT) capsule Take 500 Units by mouth daily.   Cinnamon 500 MG capsule Take 500 mg by mouth daily.   CoQ10 200 MG Caps Take 1 capsule by mouth daily.   cyanocobalamin 1000 MCG tablet Take 1 tablet by mouth daily.   FISH OIL PO Take by mouth.   gabapentin 100 MG capsule Commonly known as: NEURONTIN TAKE 1 CAPSULE EVERY MORNING  TAKE 1 CAPSULE AT NOON  AND TAKE 3 CAPSULES AT BEDTIME   hydrocortisone 25 MG suppository Commonly  known as: ANUSOL-HC UNWRAP AND INSERT 1 SUPPOSITORY RECALLY TWICE A DAY   levocetirizine 5 MG tablet Commonly known as: XYZAL TAKE 1 TABLET BY MOUTH DAILY   metFORMIN 500 MG tablet Commonly known as: GLUCOPHAGE Take 1 tablet (500 mg total) by mouth 2 (two) times daily with a meal.   mirabegron ER 50 MG Tb24 tablet Commonly known as: MYRBETRIQ Take 1 tablet (50 mg total) by mouth daily.   montelukast 10 MG tablet Commonly known as: SINGULAIR TAKE 1 TABLET BY MOUTH DAILY   nitrofurantoin (macrocrystal-monohydrate) 100 MG capsule Commonly known as: MACROBID Take 1 capsule (100 mg total) by mouth 2 (two) times daily.   oxybutynin 15 MG 24 hr tablet Commonly known as: DITROPAN XL Take 1 tablet (15 mg total) by mouth  daily. Started by: [name], MD   pravastatin 20 MG tablet Commonly known as: PRAVACHOL TAKE ONE TABLET AT BEDTIME   PROBIOTIC ACIDOPHILUS PO Take 1 capsule by mouth daily.   ramipril 2.5 MG capsule Commonly known as: ALTACE TAKE 1 CAPSULE BY MOUTH EVERY DAY   triamcinolone cream 0.1 % Commonly known as: KENALOG Use topically BID prn       Allergies:  Allergies  Allergen Reactions  . Ibuprofen     Other reaction(s): Abdominal Pain Can't take NSAIDS secondary to erosions due to NSAIDS.  Marland Kitchen Nsaids     Can't take NSAIDS secondary to erosions due to NSAIDS.    Family History: Family History  Problem Relation Age of Onset  . Heart attack Mother   . Stroke Mother   . Diabetes Mother   . Throat cancer Mother   . Heart attack Father   . Hypertension Father   . Glaucoma Father   . AAA (abdominal aortic aneurysm) Father   . Obesity Sister   . Throat cancer Maternal Grandmother   . Heart attack Maternal Grandfather   . CVA Paternal Grandmother   . Congestive Heart Failure Paternal Grandmother   . Heart attack Paternal Grandfather     Social History:  reports that she has never smoked. She has never used smokeless tobacco. She reports that she does not drink alcohol and does not use drugs.   Physical Exam: BP (!) 143/84   Pulse 65   Ht 5\' 4"  (1.626 m)   Wt 214 lb (97.1 kg)   BMI 36.73 kg/m   Constitutional:  Alert and oriented, No acute distress. HEENT: [place] AT, moist mucus membranes.  Trachea midline, no masses. Cardiovascular: No clubbing, cyanosis, or edema. Respiratory: Normal respiratory effort, no increased work of breathing. Skin: No rashes, bruises or suspicious lesions. Neurologic: Grossly intact, no focal deficits, moving all 4 extremities. Psychiatric: Normal mood and affect.  Laboratory Data:  Lab Results  Component Value Date   CREATININE 0.68 04/02/2020     Lab Results  Component Value Date   HGBA1C 6.1 (H) 04/02/2020     Urinalysis Negative   Results for orders placed or performed in visit on 04/29/20  Microscopic Examination   Urine  Result Value Ref Range   WBC, UA 0-5 0 - 5 /hpf   RBC None seen 0 - 2 /hpf   Epithelial Cells (non renal) 0-10 0 - 10 /hpf   Bacteria, UA None seen None seen/Few  Urinalysis, Complete  Result Value Ref Range   Specific Gravity, UA 1.020 1.005 - 1.030   pH, UA 7.0 5.0 - 7.5   Color, UA Yellow Yellow   Appearance Ur Hazy (A) Clear  Leukocytes,UA Negative Negative   Protein,UA Negative Negative/Trace   Glucose, UA Negative Negative   Ketones, UA Negative Negative   RBC, UA Negative Negative   Bilirubin, UA Negative Negative   Urobilinogen, Ur 0.2 0.2 - 1.0 mg/dL   Nitrite, UA Negative Negative   Microscopic Examination See below:   BLADDER SCAN AMB NON-IMAGING  Result Value Ref Range   Scan Result 0 ML      Pertinent Imaging: PVR was 0 mL today.  Assessment & Plan:    1. Chronic cystitis No concern for infection  Previous urine cytologies have been negative without microscopic blood, no concern for additional pathology Okay to use Pyridium as needed for occasional intermittent episodes, encourage hydration with water primarily Status post negative biopsy in 2017 which is reassuring -PVR was 0 mL today.  2. OAB (overactive bladder) We discussed behavioral modification at length today We will plan to optimize pharmacotherapy and then consider treatment for refractory symptoms Discussed nerve stimulation therapy vs botox a second line if she fails the above Will continue on Myrbetriq 50 mg Adding oxybutynin 15 mg XL for dual therapy   Follow up in 6 weeks for reassessment and PVR.    [facility] [address], [place], [place] [address] [phone]  I, Selena Batten, am acting as a scribe for Dr. [name].  I have reviewed the above documentation for accuracy and completeness, and I agree with  the above.   [name], MD

## 2020-04-29 ENCOUNTER — Ambulatory Visit (INDEPENDENT_AMBULATORY_CARE_PROVIDER_SITE_OTHER): Payer: 59 | Admitting: Urology

## 2020-04-29 ENCOUNTER — Other Ambulatory Visit: Payer: Self-pay

## 2020-04-29 VITALS — BP 143/84 | HR 65 | Ht 64.0 in | Wt 214.0 lb

## 2020-04-29 DIAGNOSIS — N3281 Overactive bladder: Secondary | ICD-10-CM

## 2020-04-29 DIAGNOSIS — N3020 Other chronic cystitis without hematuria: Secondary | ICD-10-CM | POA: Diagnosis not present

## 2020-04-29 LAB — BLADDER SCAN AMB NON-IMAGING: Scan Result: 0

## 2020-04-29 MED ORDER — MIRABEGRON ER 50 MG PO TB24: 50.0000 mg | ORAL_TABLET | Freq: Every day | ORAL | 6 refills | Status: DC

## 2020-04-29 MED ORDER — OXYBUTYNIN CHLORIDE ER 15 MG PO TB24: 15.0000 mg | ORAL_TABLET | Freq: Every day | ORAL | 2 refills | Status: DC

## 2020-04-30 LAB — URINALYSIS, COMPLETE
Bilirubin, UA: NEGATIVE
Glucose, UA: NEGATIVE
Ketones, UA: NEGATIVE
Leukocytes,UA: NEGATIVE
Nitrite, UA: NEGATIVE
Protein,UA: NEGATIVE
RBC, UA: NEGATIVE
Specific Gravity, UA: 1.02 (ref 1.005–1.030)
Urobilinogen, Ur: 0.2 mg/dL (ref 0.2–1.0)
pH, UA: 7 (ref 5.0–7.5)

## 2020-04-30 LAB — MICROSCOPIC EXAMINATION
Bacteria, UA: NONE SEEN
RBC, Urine: NONE SEEN /hpf (ref 0–2)

## 2020-05-05 ENCOUNTER — Other Ambulatory Visit: Payer: Self-pay | Admitting: Physician Assistant

## 2020-05-05 DIAGNOSIS — I10 Essential (primary) hypertension: Secondary | ICD-10-CM

## 2020-05-05 DIAGNOSIS — E1129 Type 2 diabetes mellitus with other diabetic kidney complication: Secondary | ICD-10-CM

## 2020-05-06 ENCOUNTER — Other Ambulatory Visit: Payer: Self-pay | Admitting: Physician Assistant

## 2020-05-06 DIAGNOSIS — J309 Allergic rhinitis, unspecified: Secondary | ICD-10-CM

## 2020-05-06 DIAGNOSIS — E1129 Type 2 diabetes mellitus with other diabetic kidney complication: Secondary | ICD-10-CM

## 2020-05-22 ENCOUNTER — Other Ambulatory Visit: Payer: Self-pay | Admitting: Physician Assistant

## 2020-05-22 DIAGNOSIS — E7800 Pure hypercholesterolemia, unspecified: Secondary | ICD-10-CM

## 2020-05-25 ENCOUNTER — Telehealth: Payer: Self-pay

## 2020-05-25 ENCOUNTER — Ambulatory Visit
Admission: RE | Admit: 2020-05-25 | Discharge: 2020-05-25 | Disposition: A | Discharge status: Home / Self Care | Payer: 59 | Source: Ambulatory Visit | Attending: Physician Assistant | Admitting: Physician Assistant

## 2020-05-25 ENCOUNTER — Other Ambulatory Visit: Payer: Self-pay

## 2020-05-25 DIAGNOSIS — E2839 Other primary ovarian failure: Secondary | ICD-10-CM | POA: Diagnosis not present

## 2020-05-25 NOTE — Telephone Encounter (Signed)
-----   Message from [name], [place] sent at 05/25/2020 11:33 AM EDT ----- Bone density is normal. Can repeat in 10 years if desired.

## 2020-05-25 NOTE — Telephone Encounter (Signed)
LMTCB-if patient calls back ok for the [facility] nurse to give results.

## 2020-05-26 NOTE — Telephone Encounter (Signed)
Attempted to call pt.  Left vm on home phone to return call to office for xray results.

## 2020-05-26 NOTE — Telephone Encounter (Signed)
Patient notified of Bone Density results and PCP recommendation

## 2020-05-29 ENCOUNTER — Other Ambulatory Visit: Payer: Self-pay

## 2020-05-29 ENCOUNTER — Ambulatory Visit
Admission: RE | Admit: 2020-05-29 | Discharge: 2020-05-29 | Disposition: A | Discharge status: Home / Self Care | Payer: 59 | Source: Ambulatory Visit | Attending: Physician Assistant | Admitting: Physician Assistant

## 2020-05-29 DIAGNOSIS — Z1231 Encounter for screening mammogram for malignant neoplasm of breast: Secondary | ICD-10-CM | POA: Diagnosis not present

## 2020-06-01 ENCOUNTER — Telehealth: Payer: Self-pay

## 2020-06-01 NOTE — Telephone Encounter (Signed)
Left message for patient to call back okay for [facility] triage to advise. KW 

## 2020-06-01 NOTE — Telephone Encounter (Signed)
Result note read to patient, verbalizes understanding. ?

## 2020-06-01 NOTE — Telephone Encounter (Signed)
-----   Message from [name], PA-C sent at 05/31/2020 11:13 AM EDT ----- Normal mammogram. Repeat screening in one year.

## 2020-06-10 ENCOUNTER — Telehealth: Payer: Self-pay | Admitting: *Deleted

## 2020-06-10 NOTE — Telephone Encounter (Signed)
Left Vm regarding appointment on 06/16/20-need to R/S with Sam

## 2020-06-16 ENCOUNTER — Ambulatory Visit: Payer: Self-pay | Admitting: Urology

## 2020-06-30 ENCOUNTER — Other Ambulatory Visit: Payer: Self-pay | Admitting: Physician Assistant

## 2020-06-30 DIAGNOSIS — J309 Allergic rhinitis, unspecified: Secondary | ICD-10-CM

## 2020-06-30 DIAGNOSIS — F411 Generalized anxiety disorder: Secondary | ICD-10-CM

## 2020-06-30 NOTE — Telephone Encounter (Signed)
Requested medication (s) are due for refill today: no  Requested medication (s) are on the active medication list: yes   Last refill: 04/21/2020  Future visit scheduled: no  Notes to clinic: this refill cannot be delegated    Requested Prescriptions  Pending Prescriptions Disp Refills   ALPRAZolam (XANAX) 0.5 MG tablet [Pharmacy Med Name: ALPRAZOLAM 0.5 MG TAB] 90 tablet     Sig: TAKE ONE TABLET BY MOUTH EVERY EIGHT HOURS AS NEEDED      Not Delegated - Psychiatry:  Anxiolytics/Hypnotics Failed - 06/30/2020  8:21 AM      Failed - This refill cannot be delegated      Failed - Urine Drug Screen completed in last 360 days.      Passed - Valid encounter within last 6 months    Recent Outpatient Visits           2 months ago Routine general medical examination at a health care facility   [place], [place], [place]   6 months ago Skin ulcer, limited to breakdown of skin [facility])   [place], Cle[place]rel, [place]   1 year ago Annual physical exam   [facility] [place], [place], [place]   1 year ago Essential hypertension   [place], [place], [place]   2 years ago Annual physical exam   [facility] [place]alling M, [place]               Signed Prescriptions Disp Refills   montelukast (SINGULAIR) 10 MG tablet 90 tablet 1    Sig: TAKE 1 TABLET BY MOUTH DAILY      Pulmonology:  Leukotriene Inhibitors Passed - 06/30/2020  8:21 AM      Passed - Valid encounter within last 12 months    Recent Outpatient Visits           2 months ago Routine general medical examination at a health care facility   [place], [place] M, [place]   6 months ago Skin ulcer, limited to breakdown of skin [facility])   [place], [place], [place]   1 year ago Annual physical exam   [facility] [place], [place], [place]   1 year ago Essential  hypertension   [facility] [place], [place]   2 years ago Annual physical exam   [facility] [name], [place]

## 2020-07-07 ENCOUNTER — Other Ambulatory Visit: Payer: Self-pay | Admitting: Physician Assistant

## 2020-07-07 DIAGNOSIS — I10 Essential (primary) hypertension: Secondary | ICD-10-CM

## 2020-07-07 NOTE — Telephone Encounter (Signed)
Requested Prescriptions  Pending Prescriptions Disp Refills   ramipril (ALTACE) 2.5 MG capsule [Pharmacy Med Name: RAMIPRIL 2.5 MG CAP] 90 capsule 0    Sig: TAKE 1 CAPSULE BY MOUTH EVERY DAY     Cardiovascular:  ACE Inhibitors Failed - 07/07/2020 10:10 AM      Failed - Last BP in normal range    BP Readings from Last 1 Encounters:  04/29/20 (!) 143/84         Passed - Cr in normal range and within 180 days    Creatinine, Ser  Date Value Ref Range Status  04/02/2020 0.68 0.57 - 1.00 mg/dL Final         Passed - K in normal range and within 180 days    Potassium  Date Value Ref Range Status  04/02/2020 4.0 3.5 - 5.2 mmol/L Final         Passed - Patient is not pregnant      Passed - Valid encounter within last 6 months    Recent Outpatient Visits          3 months ago Routine general medical examination at a health care facility   [place], [place], [place]   6 months ago Skin ulcer, limited to breakdown of skin [facility])   [place], [place], [place]   1 year ago Annual physical exam   [facility] [place], Cle[place]ce [place]rel, [place]   1 year ago Essential hypertension   [facility] [place], Wendee [place], [place]   2 years ago Annual physical exam   [facility] [place], [place], [place]

## 2020-08-07 ENCOUNTER — Encounter: Payer: Self-pay | Admitting: Physician Assistant

## 2020-08-07 ENCOUNTER — Other Ambulatory Visit: Payer: Self-pay

## 2020-08-07 ENCOUNTER — Ambulatory Visit (INDEPENDENT_AMBULATORY_CARE_PROVIDER_SITE_OTHER): Payer: 59 | Admitting: Physician Assistant

## 2020-08-07 VITALS — BP 135/71 | HR 67 | Temp 97.6°F | Resp 16 | Ht 64.0 in | Wt 210.6 lb

## 2020-08-07 DIAGNOSIS — M62838 Other muscle spasm: Secondary | ICD-10-CM | POA: Diagnosis not present

## 2020-08-07 DIAGNOSIS — R011 Cardiac murmur, unspecified: Secondary | ICD-10-CM | POA: Diagnosis not present

## 2020-08-07 DIAGNOSIS — M545 Low back pain, unspecified: Secondary | ICD-10-CM | POA: Diagnosis not present

## 2020-08-07 MED ORDER — BACLOFEN 10 MG PO TABS: 10.0000 mg | ORAL_TABLET | Freq: Three times a day (TID) | ORAL | 0 refills | Status: DC

## 2020-08-07 NOTE — Progress Notes (Signed)
Established patient visit   Patient: Virginia Weaver   DOB: [birth year]   69 y.o. Female  MRN: [id number] Visit Date: 08/07/2020  Today's healthcare provider: [name], PA-C   Chief Complaint  Patient presents with  . Spasms   Subjective    HPI  Patient here with c/o muscle spasm in her lower back and wants to have EKG. She has done some stretches, heating pad and Tylenol. She reports the pain is in her mid and low back. It radiated up the left shoulder blade and into the left posterior arm. It has been improving with the conservative treatments she has been using.   Patient Active Problem List   Diagnosis Date Noted  . Diabetes mellitus without complication ([place]) 68/34/1962  . Class 2 severe obesity due to excess calories with serious comorbidity and body mass index (BMI) of 35.0 to 35.9 in adult ([place]) 03/27/2018  . B12 deficiency 03/17/2016  . Allergic rhinitis 02/18/2016  . Urticaria 06/04/2015  . Anxiety 04/29/2015  . Bilateral tinnitus 04/29/2015  . Avitaminosis D 04/29/2015  . Hypertension 03/30/2015  . Hypercholesteremia 03/30/2015  . Cardiovascular symptoms 01/12/2010  . Syncope and collapse 01/20/2009  . Fam hx-ischem heart disease 11/25/2008  . Acid reflux 04/02/2001   Past Medical History:  Diagnosis Date  . Acid reflux 04/02/2001  . Adiposity 04/20/1993  . Benign essential HTN 11/11/1993  . Diabetes mellitus without complication (HCC)    diet   . Heart murmur   . Hypertension   . Syncope and collapse 01/20/2009  . Tinnitus    left ear  . Urticaria 06/04/2015       Medications: Outpatient Medications Prior to Visit  Medication Sig  . ALPRAZolam (XANAX) 0.5 MG tablet TAKE ONE TABLET BY MOUTH EVERY EIGHT HOURS AS NEEDED  . calcium carbonate (OS-CAL - DOSED IN MG OF ELEMENTAL CALCIUM) 1250 (500 Ca) MG tablet Take 1 tablet by mouth daily with breakfast.  . Cholecalciferol 1000 UNITS capsule Take 500 Units by mouth daily.   . Cinnamon 500 MG  capsule Take 500 mg by mouth daily.  . Coenzyme Q10 (COQ10) 200 MG CAPS Take 1 capsule by mouth daily.  . cyanocobalamin 1000 MCG tablet Take 1 tablet by mouth daily.  Marland Kitchen gabapentin (NEURONTIN) 100 MG capsule TAKE 1 CAPSULE EVERY MORNING  TAKE 1 CAPSULE AT NOON  AND TAKE 3 CAPSULES AT BEDTIME  . hydrocortisone (ANUSOL-HC) 25 MG suppository UNWRAP AND INSERT 1 SUPPOSITORY RECALLY TWICE A DAY  . Lactobacillus (PROBIOTIC ACIDOPHILUS PO) Take 1 capsule by mouth daily.  Marland Kitchen levocetirizine (XYZAL) 5 MG tablet TAKE 1 TABLET BY MOUTH DAILY  . metFORMIN (GLUCOPHAGE) 500 MG tablet TAKE ONE TABLET BY MOUTH TWICE DAILY WITH A MEAL  . mirabegron ER (MYRBETRIQ) 50 MG TB24 tablet Take 1 tablet (50 mg total) by mouth daily.  . montelukast (SINGULAIR) 10 MG tablet TAKE 1 TABLET BY MOUTH DAILY  . Omega-3 Fatty Acids (FISH OIL PO) Take by mouth.  . oxybutynin (DITROPAN XL) 15 MG 24 hr tablet Take 1 tablet (15 mg total) by mouth daily.  . pravastatin (PRAVACHOL) 20 MG tablet TAKE ONE TABLET AT BEDTIME  . ramipril (ALTACE) 2.5 MG capsule TAKE 1 CAPSULE BY MOUTH EVERY DAY  . triamcinolone cream (KENALOG) 0.1 % Use topically BID prn   No facility-administered medications prior to visit.    Review of Systems  Constitutional: Negative for appetite change, chills, fatigue and fever.  Respiratory: Negative for chest tightness  and shortness of breath.   Cardiovascular: Negative for chest pain and palpitations.  Gastrointestinal: Negative for abdominal pain, nausea and vomiting.  Musculoskeletal: Positive for back pain and myalgias.  Neurological: Negative for dizziness and weakness.    Last CBC Lab Results  Component Value Date   WBC 4.0 04/02/2020   HGB 13.4 04/02/2020   HCT 39.7 04/02/2020   MCV 91 04/02/2020   MCH 30.8 04/02/2020   RDW 12.5 04/02/2020   PLT 174 64/33/2951   Last metabolic panel Lab Results  Component Value Date   GLUCOSE 119 (H) 04/02/2020   NA 142 04/02/2020   K 4.0 04/02/2020   CL  104 04/02/2020   CO2 25 04/02/2020   BUN 15 04/02/2020   CREATININE 0.68 04/02/2020   GFRNONAA 91 04/02/2020   GFRAA 105 04/02/2020   CALCIUM 9.7 04/02/2020   PROT 6.3 04/02/2020   ALBUMIN 4.3 04/02/2020   LABGLOB 2.0 04/02/2020   AGRATIO 2.2 04/02/2020   BILITOT 0.6 04/02/2020   ALKPHOS 73 04/02/2020   AST 21 04/02/2020   ALT 10 04/02/2020      Objective    BP 135/71 (BP Location: Left Arm, Patient Position: Sitting, Cuff Size: Large)   Pulse 67   Temp 97.6 F (36.4 C) (Oral)   Resp 16   Ht 5\' 4"  (1.626 m)   Wt 210 lb 9.6 oz (95.5 kg)   BMI 36.15 kg/m  BP Readings from Last 3 Encounters:  08/07/20 135/71  04/29/20 (!) 143/84  04/02/20 126/84   Wt Readings from Last 3 Encounters:  08/07/20 210 lb 9.6 oz (95.5 kg)  04/29/20 214 lb (97.1 kg)  04/02/20 214 lb (97.1 kg)      Physical Exam Vitals reviewed.  Constitutional:      General: She is not in acute distress.    Appearance: Normal appearance. She is well-developed. She is obese. She is not ill-appearing or diaphoretic.  Cardiovascular:     Rate and Rhythm: Normal rate and regular rhythm.     Pulses: Normal pulses.     Heart sounds: Normal heart sounds. No murmur heard.  No friction rub. No gallop.   Pulmonary:     Effort: Pulmonary effort is normal. No respiratory distress.     Breath sounds: Normal breath sounds. No wheezing or rales.  Musculoskeletal:     Cervical back: Normal, normal range of motion and neck supple. No tenderness.     Thoracic back: No spasms. Decreased range of motion.     Lumbar back: Spasms and tenderness present. Decreased range of motion. Negative right straight leg raise test and negative left straight leg raise test.  Neurological:     Mental Status: She is alert.      Results for orders placed or performed in visit on 08/07/20  Urine Culture   Specimen: Urine   Urine  Result Value Ref Range   Urine Culture, Routine Final report    Organism ID, Bacteria Comment      Assessment & Plan     1. Low back pain, unspecified back pain laterality, unspecified chronicity, unspecified whether sciatica present UA normal. Will send for culture to r/o infection. - Urine Culture  2. Murmur, heart EKG today shows NSR rate of 64 unchanged since 2016, personally reviewed by me.  - EKG 12-Lead  3. Muscle spasm Suspect muscle spasm from overuse. Baclofen provided as below. Continue heat, stretches and tylenol prn. Call if worsening.  - baclofen (LIORESAL) 10 MG tablet; Take 1  tablet (10 mg total) by mouth 3 (three) times daily.  Dispense: 30 each; Refill: 0   No follow-ups on file.      Reynolds Bowl, PA-C, have reviewed all documentation for this visit. The documentation on 08/11/20 for the exam, diagnosis, procedures, and orders are all accurate and complete.   Rubye Beach  [facility] [phone] (phone) [phone] (fax)  [place]

## 2020-08-10 LAB — URINE CULTURE

## 2020-08-11 ENCOUNTER — Encounter: Payer: Self-pay | Admitting: Physician Assistant

## 2020-08-11 LAB — POCT URINALYSIS DIPSTICK
Appearance: NORMAL
Bilirubin, UA: NEGATIVE
Blood, UA: NEGATIVE
Glucose, UA: NEGATIVE
Ketones, UA: NEGATIVE
Leukocytes, UA: NEGATIVE
Nitrite, UA: NEGATIVE
Protein, UA: NEGATIVE
Spec Grav, UA: 1.02 (ref 1.010–1.025)
Urobilinogen, UA: 0.2 E.U./dL
pH, UA: 6 (ref 5.0–8.0)

## 2020-08-28 ENCOUNTER — Encounter: Payer: Self-pay | Admitting: Physician Assistant

## 2020-08-28 ENCOUNTER — Other Ambulatory Visit: Payer: Self-pay

## 2020-08-28 ENCOUNTER — Ambulatory Visit (INDEPENDENT_AMBULATORY_CARE_PROVIDER_SITE_OTHER): Payer: 59 | Admitting: Physician Assistant

## 2020-08-28 VITALS — BP 154/73 | HR 72 | Temp 97.7°F | Resp 16 | Wt 212.1 lb

## 2020-08-28 DIAGNOSIS — T148XXA Other injury of unspecified body region, initial encounter: Secondary | ICD-10-CM

## 2020-08-28 DIAGNOSIS — R252 Cramp and spasm: Secondary | ICD-10-CM | POA: Diagnosis not present

## 2020-08-28 NOTE — Progress Notes (Signed)
Established patient visit   Patient: Virginia Weaver   DOB: 19-Apr-1951   69 y.o. Female  MRN: [id number] Visit Date: 08/28/2020  Today's healthcare provider: [name], PA-C   Chief Complaint  Patient presents with  . Blister   Subjective    HPI  Patient here with c/o blister in foot that just came up today, right leg. She reports she was having some aching around her ankle and she put some ice on her leg. She unfortunately fell asleep with the ice on her leg.   She also would like to talk about having random cramps.   Patient Active Problem List   Diagnosis Date Noted  . Diabetes mellitus without complication ([place]) 88/41/6606  . Class 2 severe obesity due to excess calories with serious comorbidity and body mass index (BMI) of 35.0 to 35.9 in adult ([place]) 03/27/2018  . B12 deficiency 03/17/2016  . Allergic rhinitis 02/18/2016  . Urticaria 06/04/2015  . Anxiety 04/29/2015  . Bilateral tinnitus 04/29/2015  . Avitaminosis D 04/29/2015  . Hypertension 03/30/2015  . Hypercholesteremia 03/30/2015  . Cardiovascular symptoms 01/12/2010  . Syncope and collapse 01/20/2009  . Fam hx-ischem heart disease 11/25/2008  . Acid reflux 04/02/2001   Past Medical History:  Diagnosis Date  . Acid reflux 04/02/2001  . Adiposity 04/20/1993  . Benign essential HTN 11/11/1993  . Diabetes mellitus without complication (HCC)    diet   . Heart murmur   . Hypertension   . Syncope and collapse 01/20/2009  . Tinnitus    left ear  . Urticaria 06/04/2015       Medications: Outpatient Medications Prior to Visit  Medication Sig  . ALPRAZolam (XANAX) 0.5 MG tablet TAKE ONE TABLET BY MOUTH EVERY EIGHT HOURS AS NEEDED  . baclofen (LIORESAL) 10 MG tablet Take 1 tablet (10 mg total) by mouth 3 (three) times daily.  . calcium carbonate (OS-CAL - DOSED IN MG OF ELEMENTAL CALCIUM) 1250 (500 Ca) MG tablet Take 1 tablet by mouth daily with breakfast.  . Cholecalciferol 1000 UNITS capsule  Take 500 Units by mouth daily.   . Cinnamon 500 MG capsule Take 500 mg by mouth daily.  . Coenzyme Q10 (COQ10) 200 MG CAPS Take 1 capsule by mouth daily.  . cyanocobalamin 1000 MCG tablet Take 1 tablet by mouth daily.  Marland Kitchen gabapentin (NEURONTIN) 100 MG capsule TAKE 1 CAPSULE EVERY MORNING  TAKE 1 CAPSULE AT NOON  AND TAKE 3 CAPSULES AT BEDTIME  . hydrocortisone (ANUSOL-HC) 25 MG suppository UNWRAP AND INSERT 1 SUPPOSITORY RECALLY TWICE A DAY  . Lactobacillus (PROBIOTIC ACIDOPHILUS PO) Take 1 capsule by mouth daily.  Marland Kitchen levocetirizine (XYZAL) 5 MG tablet TAKE 1 TABLET BY MOUTH DAILY  . metFORMIN (GLUCOPHAGE) 500 MG tablet TAKE ONE TABLET BY MOUTH TWICE DAILY WITH A MEAL  . mirabegron ER (MYRBETRIQ) 50 MG TB24 tablet Take 1 tablet (50 mg total) by mouth daily.  . montelukast (SINGULAIR) 10 MG tablet TAKE 1 TABLET BY MOUTH DAILY  . Omega-3 Fatty Acids (FISH OIL PO) Take by mouth.  . oxybutynin (DITROPAN XL) 15 MG 24 hr tablet Take 1 tablet (15 mg total) by mouth daily.  . pravastatin (PRAVACHOL) 20 MG tablet TAKE ONE TABLET AT BEDTIME  . ramipril (ALTACE) 2.5 MG capsule TAKE 1 CAPSULE BY MOUTH EVERY DAY  . triamcinolone cream (KENALOG) 0.1 % Use topically BID prn   No facility-administered medications prior to visit.    Review of Systems  Constitutional: Negative.  Respiratory: Negative.   Cardiovascular: Negative.   Musculoskeletal: Positive for myalgias.  Skin: Positive for wound.    Last CBC Lab Results  Component Value Date   WBC 4.0 04/02/2020   HGB 13.4 04/02/2020   HCT 39.7 04/02/2020   MCV 91 04/02/2020   MCH 30.8 04/02/2020   RDW 12.5 04/02/2020   PLT 174 29/52/8413   Last metabolic panel Lab Results  Component Value Date   GLUCOSE 119 (H) 04/02/2020   NA 142 04/02/2020   K 4.0 04/02/2020   CL 104 04/02/2020   CO2 25 04/02/2020   BUN 15 04/02/2020   CREATININE 0.68 04/02/2020   GFRNONAA 91 04/02/2020   GFRAA 105 04/02/2020   CALCIUM 9.7 04/02/2020   PROT 6.3  04/02/2020   ALBUMIN 4.3 04/02/2020   LABGLOB 2.0 04/02/2020   AGRATIO 2.2 04/02/2020   BILITOT 0.6 04/02/2020   ALKPHOS 73 04/02/2020   AST 21 04/02/2020   ALT 10 04/02/2020      Objective    BP (!) 154/73 (BP Location: Left Arm, Patient Position: Sitting, Cuff Size: Large)   Pulse 72   Temp 97.7 F (36.5 C) (Oral)   Resp 16   Wt 212 lb 1.6 oz (96.2 kg)   BMI 36.41 kg/m  BP Readings from Last 3 Encounters:  08/28/20 (!) 154/73  08/07/20 135/71  04/29/20 (!) 143/84   Wt Readings from Last 3 Encounters:  08/28/20 212 lb 1.6 oz (96.2 kg)  08/07/20 210 lb 9.6 oz (95.5 kg)  04/29/20 214 lb (97.1 kg)      Physical Exam Vitals reviewed.  Constitutional:      General: She is not in acute distress.    Appearance: Normal appearance. She is well-developed. She is obese. She is not ill-appearing or diaphoretic.  Cardiovascular:     Rate and Rhythm: Normal rate and regular rhythm.     Heart sounds: Normal heart sounds. No murmur heard.  No friction rub. No gallop.   Pulmonary:     Effort: Pulmonary effort is normal. No respiratory distress.     Breath sounds: Normal breath sounds. No wheezing or rales.  Musculoskeletal:     Cervical back: Normal range of motion and neck supple.  Skin:      Neurological:     Mental Status: She is alert.      No results found for any visits on 08/28/20.  Assessment & Plan     1. Blister No signs of infection noted. Discussed expected course. Call if worsening or changing.  2. Cramp in muscle Discussed adding magnesium at night to see if this helps. Previous labs were normal and reviewed today.    No follow-ups on file.      Reynolds Bowl, PA-C, have reviewed all documentation for this visit. The documentation on 09/03/20 for the exam, diagnosis, procedures, and orders are all accurate and complete.   Rubye Beach  [facility] [phone] (phone) [phone] (fax)  [place]

## 2020-09-03 ENCOUNTER — Encounter: Payer: Self-pay | Admitting: Physician Assistant

## 2020-10-12 ENCOUNTER — Other Ambulatory Visit: Payer: Self-pay | Admitting: Physician Assistant

## 2020-10-12 DIAGNOSIS — I10 Essential (primary) hypertension: Secondary | ICD-10-CM

## 2020-10-12 NOTE — Telephone Encounter (Signed)
Attempted to call pt.  Left vm to call and schedule 6 mo. F/u appt. With PCP.  Refilled #[phone] with no additional refills at this time.  Requested Prescriptions  Pending Prescriptions Disp Refills   ramipril (ALTACE) 2.5 MG capsule [Pharmacy Med Name: RAMIPRIL 2.5 MG CAP] 30 capsule 0    Sig: TAKE 1 CAPSULE BY MOUTH EVERY DAY     Cardiovascular:  ACE Inhibitors Failed - 10/12/2020  3:56 PM      Failed - Cr in normal range and within 180 days    Creatinine, Ser  Date Value Ref Range Status  04/02/2020 0.68 0.57 - 1.00 mg/dL Final         Failed - K in normal range and within 180 days    Potassium  Date Value Ref Range Status  04/02/2020 4.0 3.5 - 5.2 mmol/L Final         Failed - Last BP in normal range    BP Readings from Last 1 Encounters:  08/28/20 (!) 154/73         Passed - Patient is not pregnant      Passed - Valid encounter within last 6 months    Recent Outpatient Visits          1 month ago [place], [place], [place]   2 months ago Low back pain, unspecified back pain laterality, unspecified chronicity, unspecified whether sciatica present   [place], PA-C   6 months ago Routine general medical examination at a health care facility   [place], [place], [place]   10 months ago Skin ulcer, limited to breakdown of skin [facility])   [place], Clearnce Sorrel, [place]   1 year ago Annual physical exam   [facility] [place], [place], [place]

## 2020-10-20 DIAGNOSIS — E7800 Pure hypercholesterolemia, unspecified: Secondary | ICD-10-CM | POA: Diagnosis not present

## 2020-10-20 DIAGNOSIS — E6609 Other obesity due to excess calories: Secondary | ICD-10-CM | POA: Diagnosis not present

## 2020-10-20 DIAGNOSIS — Z6837 Body mass index (BMI) 37.0-37.9, adult: Secondary | ICD-10-CM | POA: Diagnosis not present

## 2020-10-20 DIAGNOSIS — I10 Essential (primary) hypertension: Secondary | ICD-10-CM | POA: Diagnosis not present

## 2020-10-22 ENCOUNTER — Other Ambulatory Visit: Payer: Self-pay | Admitting: Physician Assistant

## 2020-10-22 DIAGNOSIS — G629 Polyneuropathy, unspecified: Secondary | ICD-10-CM

## 2020-10-28 ENCOUNTER — Ambulatory Visit: Payer: 59 | Admitting: Physician Assistant

## 2020-10-30 ENCOUNTER — Ambulatory Visit: Payer: 59 | Admitting: Physician Assistant

## 2020-11-02 ENCOUNTER — Other Ambulatory Visit: Payer: 59

## 2020-11-02 DIAGNOSIS — Z20822 Contact with and (suspected) exposure to covid-19: Secondary | ICD-10-CM

## 2020-11-04 ENCOUNTER — Telehealth (INDEPENDENT_AMBULATORY_CARE_PROVIDER_SITE_OTHER): Payer: 59 | Admitting: Physician Assistant

## 2020-11-04 ENCOUNTER — Encounter: Payer: Self-pay | Admitting: Physician Assistant

## 2020-11-04 DIAGNOSIS — J0140 Acute pansinusitis, unspecified: Secondary | ICD-10-CM

## 2020-11-04 DIAGNOSIS — E7800 Pure hypercholesterolemia, unspecified: Secondary | ICD-10-CM

## 2020-11-04 DIAGNOSIS — E559 Vitamin D deficiency, unspecified: Secondary | ICD-10-CM

## 2020-11-04 DIAGNOSIS — E538 Deficiency of other specified B group vitamins: Secondary | ICD-10-CM

## 2020-11-04 DIAGNOSIS — I10 Essential (primary) hypertension: Secondary | ICD-10-CM | POA: Diagnosis not present

## 2020-11-04 DIAGNOSIS — G629 Polyneuropathy, unspecified: Secondary | ICD-10-CM | POA: Diagnosis not present

## 2020-11-04 DIAGNOSIS — E119 Type 2 diabetes mellitus without complications: Secondary | ICD-10-CM | POA: Diagnosis not present

## 2020-11-04 MED ORDER — GABAPENTIN 100 MG PO CAPS: ORAL_CAPSULE | ORAL | 3 refills | Status: DC

## 2020-11-04 MED ORDER — AZITHROMYCIN 250 MG PO TABS: ORAL_TABLET | ORAL | 0 refills | Status: DC

## 2020-11-04 NOTE — Progress Notes (Signed)
MyChart Video Visit    Virtual Visit via Video Note   This visit type was conducted due to national recommendations for restrictions regarding the COVID-19 Pandemic (e.g. social distancing) in an effort to limit this patient's exposure and mitigate transmission in our community. This patient is at least at moderate risk for complications without adequate follow up. This format is felt to be most appropriate for this patient at this time. Physical exam was limited by quality of the video and audio technology used for the visit.   Interactive audio and video communications were attempted, although failed due to patient's inability to connect to video. Continued visit with audio only interaction with patient agreement.  Patient location: Home Provider location: [facility]  I discussed the limitations of evaluation and management by telemedicine and the availability of in person appointments. The patient expressed understanding and agreed to proceed.  Patient: Virginia Weaver   DOB: 10-21-1951   70 y.o. Female  MRN: [id number] Visit Date: 11/04/2020  Today's healthcare provider: [name], PA-C   No chief complaint on file.  Subjective    HPI  Patient need refills on Gabapentin. She is currently taking 1 capsule in the morning and 2 capsules at bedtime.   She also c/o congestion, nausea, pressure headache. She got tested on Monday for COVID-19. Still awaiting results.   Patient Active Problem List   Diagnosis Date Noted  . Diabetes mellitus without complication ([place]) 29/51/8841  . Class 2 severe obesity due to excess calories with serious comorbidity and body mass index (BMI) of 35.0 to 35.9 in adult ([place]) 03/27/2018  . B12 deficiency 03/17/2016  . Allergic rhinitis 02/18/2016  . Urticaria 06/04/2015  . Anxiety 04/29/2015  . Bilateral tinnitus 04/29/2015  . Avitaminosis D 04/29/2015  . Hypertension 03/30/2015  . Hypercholesteremia 03/30/2015  .  Cardiovascular symptoms 01/12/2010  . Syncope and collapse 01/20/2009  . Fam hx-ischem heart disease 11/25/2008  . Acid reflux 04/02/2001   Past Medical History:  Diagnosis Date  . Acid reflux 04/02/2001  . Adiposity 04/20/1993  . Benign essential HTN 11/11/1993  . Diabetes mellitus without complication (HCC)    diet   . Heart murmur   . Hypertension   . Syncope and collapse 01/20/2009  . Tinnitus    left ear  . Urticaria 06/04/2015      Medications: Outpatient Medications Prior to Visit  Medication Sig  . ALPRAZolam (XANAX) 0.5 MG tablet TAKE ONE TABLET BY MOUTH EVERY EIGHT HOURS AS NEEDED  . baclofen (LIORESAL) 10 MG tablet Take 1 tablet (10 mg total) by mouth 3 (three) times daily.  . calcium carbonate (OS-CAL - DOSED IN MG OF ELEMENTAL CALCIUM) 1250 (500 Ca) MG tablet Take 1 tablet by mouth daily with breakfast.  . Cholecalciferol 1000 UNITS capsule Take 500 Units by mouth daily.   . Cinnamon 500 MG capsule Take 500 mg by mouth daily.  . Coenzyme Q10 (COQ10) 200 MG CAPS Take 1 capsule by mouth daily.  . cyanocobalamin 1000 MCG tablet Take 1 tablet by mouth daily.  Marland Kitchen gabapentin (NEURONTIN) 100 MG capsule TAKE 1 CAPSULE EVERY MORNING  TAKE 1 CAPSULE AT NOON  AND TAKE 3 CAPSULES AT BEDTIME  . hydrocortisone (ANUSOL-HC) 25 MG suppository UNWRAP AND INSERT 1 SUPPOSITORY RECALLY TWICE A DAY  . Lactobacillus (PROBIOTIC ACIDOPHILUS PO) Take 1 capsule by mouth daily.  Marland Kitchen levocetirizine (XYZAL) 5 MG tablet TAKE 1 TABLET BY MOUTH DAILY  . metFORMIN (GLUCOPHAGE) 500 MG tablet  TAKE ONE TABLET BY MOUTH TWICE DAILY WITH A MEAL  . mirabegron ER (MYRBETRIQ) 50 MG TB24 tablet Take 1 tablet (50 mg total) by mouth daily.  . montelukast (SINGULAIR) 10 MG tablet TAKE 1 TABLET BY MOUTH DAILY  . Omega-3 Fatty Acids (FISH OIL PO) Take by mouth.  . oxybutynin (DITROPAN XL) 15 MG 24 hr tablet Take 1 tablet (15 mg total) by mouth daily.  . pravastatin (PRAVACHOL) 40 MG tablet Take 40 mg by mouth at  bedtime.  . ramipril (ALTACE) 5 MG capsule Take by mouth.  . triamcinolone cream (KENALOG) 0.1 % Use topically BID prn  . pravastatin (PRAVACHOL) 20 MG tablet TAKE ONE TABLET AT BEDTIME  . ramipril (ALTACE) 2.5 MG capsule TAKE 1 CAPSULE BY MOUTH EVERY DAY   No facility-administered medications prior to visit.    Review of Systems  Constitutional: Negative.   HENT: Positive for congestion, postnasal drip, sinus pressure and sinus pain.   Respiratory: Negative.   Cardiovascular: Negative.   Neurological: Negative.     @AMBLABREVIEWLINK @  Objective    There were no vitals taken for this visit. BP Readings from Last 3 Encounters:  08/28/20 (!) 154/73  08/07/20 135/71  04/29/20 (!) 143/84   Wt Readings from Last 3 Encounters:  08/28/20 212 lb 1.6 oz (96.2 kg)  08/07/20 210 lb 9.6 oz (95.5 kg)  04/29/20 214 lb (97.1 kg)      Physical Exam     Assessment & Plan     1. Neuropathy Stable. Diagnosis pulled for medication refill. Continue current medical treatment plan. - gabapentin (NEURONTIN) 100 MG capsule; TAKE 1 CAPSULE EVERY MORNING  AND TAKE 2 CAPSULES AT BEDTIME  Dispense: 270 capsule; Refill: 3  2. Acute non-recurrent pansinusitis Worsening symptoms that have not responded to OTC medications. Will give azithromycin as below. Continue allergy medications. Stay well hydrated and get plenty of rest. Call if no symptom improvement or if symptoms worsen. - azithromycin (ZITHROMAX) 250 MG tablet; Take 2 tablets PO on day one, and one tablet PO daily thereafter until completed.  Dispense: 6 tablet; Refill: 0  3. Primary hypertension Stable. Continue current medical plan. Will check labs as below and f/u pending results. - CBC w/Diff/Platelet - Comprehensive Metabolic Panel (CMET) - TSH - HgB A1c  4. Diabetes mellitus without complication (HCC) Stable. Continue current medical plan. Will check labs as below and f/u pending results. - CBC w/Diff/Platelet - Comprehensive  Metabolic Panel (CMET) - TSH - HgB A1c  5. Hypercholesteremia  - CBC w/Diff/Platelet - Comprehensive Metabolic Panel (CMET) - TSH - HgB A1c  6. B12 deficiency Stable. Continue current medical plan. Will check labs as below and f/u pending results. - CBC w/Diff/Platelet - B12  7. Avitaminosis D Stable. Continue current medical plan. Will check labs as below and f/u pending results. - CBC w/Diff/Platelet - Vitamin D (25 hydroxy)   No follow-ups on file.     I discussed the assessment and treatment plan with the patient. The patient was provided an opportunity to ask questions and all were answered. The patient agreed with the plan and demonstrated an understanding of the instructions.   The patient was advised to call back or seek an in-person evaluation if the symptoms worsen or if the condition fails to improve as anticipated.  I provided 14 minutes of non-face-to-face time during this encounter.  Reynolds Bowl, PA-C, have reviewed all documentation for this visit. The documentation on 11/04/20 for the exam, diagnosis, procedures, and  orders are all accurate and complete.  [place] [facility] [phone] (phone) [phone] (fax)  [place]

## 2020-11-05 LAB — NOVEL CORONAVIRUS, NAA: SARS-CoV-2, NAA: NOT DETECTED

## 2020-11-17 DIAGNOSIS — I10 Essential (primary) hypertension: Secondary | ICD-10-CM | POA: Diagnosis not present

## 2020-11-17 DIAGNOSIS — E119 Type 2 diabetes mellitus without complications: Secondary | ICD-10-CM | POA: Diagnosis not present

## 2020-11-17 DIAGNOSIS — E7800 Pure hypercholesterolemia, unspecified: Secondary | ICD-10-CM | POA: Diagnosis not present

## 2020-11-17 DIAGNOSIS — E559 Vitamin D deficiency, unspecified: Secondary | ICD-10-CM | POA: Diagnosis not present

## 2020-11-17 DIAGNOSIS — E538 Deficiency of other specified B group vitamins: Secondary | ICD-10-CM | POA: Diagnosis not present

## 2020-11-18 LAB — CBC WITH DIFFERENTIAL/PLATELET
Basophils Absolute: 0.1 10*3/uL (ref 0.0–0.2)
Basos: 1 %
EOS (ABSOLUTE): 0.1 10*3/uL (ref 0.0–0.4)
Eos: 2 %
Hematocrit: 42.1 % (ref 34.0–46.6)
Hemoglobin: 13.9 g/dL (ref 11.1–15.9)
Immature Grans (Abs): 0 10*3/uL (ref 0.0–0.1)
Immature Granulocytes: 0 %
Lymphocytes Absolute: 1.2 10*3/uL (ref 0.7–3.1)
Lymphs: 33 %
MCH: 30.1 pg (ref 26.6–33.0)
MCHC: 33 g/dL (ref 31.5–35.7)
MCV: 91 fL (ref 79–97)
Monocytes Absolute: 0.3 10*3/uL (ref 0.1–0.9)
Monocytes: 9 %
Neutrophils Absolute: 1.9 10*3/uL (ref 1.4–7.0)
Neutrophils: 55 %
Platelets: 185 10*3/uL (ref 150–450)
RBC: 4.62 x10E6/uL (ref 3.77–5.28)
RDW: 12.1 % (ref 11.7–15.4)
WBC: 3.5 10*3/uL (ref 3.4–10.8)

## 2020-11-18 LAB — HEMOGLOBIN A1C
Est. average glucose Bld gHb Est-mCnc: 123 mg/dL
Hgb A1c MFr Bld: 5.9 % — ABNORMAL HIGH (ref 4.8–5.6)

## 2020-11-18 LAB — TSH: TSH: 0.93 u[IU]/mL (ref 0.450–4.500)

## 2020-11-18 LAB — COMPREHENSIVE METABOLIC PANEL
ALT: 26 IU/L (ref 0–32)
AST: 34 IU/L (ref 0–40)
Albumin/Globulin Ratio: 2.2 (ref 1.2–2.2)
Albumin: 4.2 g/dL (ref 3.8–4.8)
Alkaline Phosphatase: 60 IU/L (ref 44–121)
BUN/Creatinine Ratio: 21 (ref 12–28)
BUN: 17 mg/dL (ref 8–27)
Bilirubin Total: 0.6 mg/dL (ref 0.0–1.2)
CO2: 25 mmol/L (ref 20–29)
Calcium: 9.2 mg/dL (ref 8.7–10.3)
Chloride: 103 mmol/L (ref 96–106)
Creatinine, Ser: 0.81 mg/dL (ref 0.57–1.00)
GFR calc Af Amer: 87 mL/min/{1.73_m2} (ref >59)
GFR calc non Af Amer: 75 mL/min/{1.73_m2} (ref >59)
Globulin, Total: 1.9 g/dL (ref 1.5–4.5)
Glucose: 135 mg/dL — ABNORMAL HIGH (ref 65–99)
Potassium: 4.5 mmol/L (ref 3.5–5.2)
Sodium: 141 mmol/L (ref 134–144)
Total Protein: 6.1 g/dL (ref 6.0–8.5)

## 2020-11-18 LAB — VITAMIN D 25 HYDROXY (VIT D DEFICIENCY, FRACTURES): Vit D, 25-Hydroxy: 46 ng/mL (ref 30.0–100.0)

## 2020-11-18 LAB — VITAMIN B12: Vitamin B-12: 416 pg/mL (ref 232–1245)

## 2020-11-20 ENCOUNTER — Telehealth: Payer: Self-pay

## 2020-11-20 NOTE — Telephone Encounter (Signed)
-----   Message from [name], [place] sent at 11/19/2020 11:23 AM EST ----- Virginia Weaver,  Blood count is normal. Kidney and liver function are normal. Sodium, potassium, and calcium are normal. Thyroid is normal. A1c has improved to 5.9, down from 6.1. B12 is normal. Vit D is normal.

## 2020-11-20 NOTE — Telephone Encounter (Signed)
Pt advised.   Thanks,   -Blossie Raffel  

## 2020-11-26 ENCOUNTER — Other Ambulatory Visit: Payer: Self-pay | Admitting: Physician Assistant

## 2020-11-26 DIAGNOSIS — N3281 Overactive bladder: Secondary | ICD-10-CM

## 2020-12-10 ENCOUNTER — Encounter: Payer: Self-pay | Admitting: Urology

## 2020-12-10 ENCOUNTER — Other Ambulatory Visit: Payer: Self-pay

## 2020-12-10 ENCOUNTER — Ambulatory Visit (INDEPENDENT_AMBULATORY_CARE_PROVIDER_SITE_OTHER): Payer: 59 | Admitting: Urology

## 2020-12-10 VITALS — BP 130/77 | HR 61 | Ht 64.0 in | Wt 208.0 lb

## 2020-12-10 DIAGNOSIS — R351 Nocturia: Secondary | ICD-10-CM

## 2020-12-10 DIAGNOSIS — N3281 Overactive bladder: Secondary | ICD-10-CM | POA: Diagnosis not present

## 2020-12-10 DIAGNOSIS — M79674 Pain in right toe(s): Secondary | ICD-10-CM | POA: Diagnosis not present

## 2020-12-10 DIAGNOSIS — M79671 Pain in right foot: Secondary | ICD-10-CM | POA: Diagnosis not present

## 2020-12-10 DIAGNOSIS — B351 Tinea unguium: Secondary | ICD-10-CM | POA: Diagnosis not present

## 2020-12-10 DIAGNOSIS — E1142 Type 2 diabetes mellitus with diabetic polyneuropathy: Secondary | ICD-10-CM | POA: Diagnosis not present

## 2020-12-10 LAB — BLADDER SCAN AMB NON-IMAGING

## 2020-12-10 MED ORDER — MIRABEGRON ER 50 MG PO TB24: 50.0000 mg | ORAL_TABLET | Freq: Every day | ORAL | 3 refills | Status: DC

## 2020-12-10 NOTE — Progress Notes (Signed)
12/10/2020 3:18 PM   Virginia Weaver Jan 02, 1951 [id number]  Referring provider: [name], PA-C [place],   [id number]  Chief Complaint  Patient presents with  . Over Active Bladder    Med refill    HPI: 70 year old female with severe OAB who returns today for follow-up.  She has been previously managed on multiple OAB medications.  Most recently, she was started on Myrbetriq 50 mg along with oxybutynin.  She had of having to stop oxybutynin due to severe abdominal/bowel side effects including abdominal pain and severe constipation.  She is now currently on Myrbetriq 50 mg only.  She reports a significant improvement in her daytime urinary symptoms specifically including decreased urgency, frequency, and incontinence.  She continues to get up at least 3-4 times each night which is remained stable.  This does not seem to be particular bothersome to her.  She does snore.  She has never had a sleep apnea study.  No dysuria or gross hematuria  PVR today is minimal.   PMH: Past Medical History:  Diagnosis Date  . Acid reflux 04/02/2001  . Adiposity 04/20/1993  . Benign essential HTN 11/11/1993  . Diabetes mellitus without complication (HCC)    diet   . Heart murmur   . Hypertension   . Syncope and collapse 01/20/2009  . Tinnitus    left ear  . Urticaria 06/04/2015    Surgical History: Past Surgical History:  Procedure Laterality Date  . CYSTOSCOPY WITH BIOPSY N/A 12/22/2015   Procedure: CYSTOSCOPY WITH BIOPSY;  Surgeon: [name], MD;  Location: [facility] ORS;  Service: Urology;  Laterality: N/A;  . TUBAL LIGATION  1987  . TUMMY TUCK  06/17/2010    Home Medications:  Allergies as of 12/10/2020      Reactions   Ibuprofen    Other reaction(s): Abdominal Pain Can't take NSAIDS secondary to erosions due to NSAIDS.   Nsaids    Can't take NSAIDS secondary to erosions due to NSAIDS.      Medication List       Accurate as of  December 10, 2020  3:18 PM. If you have any questions, ask your nurse or doctor.        STOP taking these medications   azithromycin 250 MG tablet Commonly known as: ZITHROMAX Stopped by: [name], MD   baclofen 10 MG tablet Commonly known as: LIORESAL Stopped by: [name], MD   oxybutynin 15 MG 24 hr tablet Commonly known as: DITROPAN XL Stopped by: [name], MD     TAKE these medications   ALPRAZolam 0.5 MG tablet Commonly known as: XANAX TAKE ONE TABLET BY MOUTH EVERY EIGHT HOURS AS NEEDED   calcium carbonate 1250 (500 Ca) MG tablet Commonly known as: OS-CAL - dosed in mg of elemental calcium Take 1 tablet by mouth daily with breakfast.   Cholecalciferol 25 MCG (1000 UT) capsule Take 500 Units by mouth daily.   Cinnamon 500 MG capsule Take 500 mg by mouth daily.   CoQ10 200 MG Caps Take 1 capsule by mouth daily.   cyanocobalamin 1000 MCG tablet Take 1 tablet by mouth daily.   FISH OIL PO Take by mouth.   gabapentin 100 MG capsule Commonly known as: NEURONTIN TAKE 1 CAPSULE EVERY MORNING  AND TAKE 2 CAPSULES AT BEDTIME   hydrocortisone 25 MG suppository Commonly known as: ANUSOL-HC UNWRAP AND INSERT 1 SUPPOSITORY RECALLY TWICE A DAY   levocetirizine 5 MG tablet Commonly known as:  XYZAL TAKE 1 TABLET BY MOUTH DAILY   metFORMIN 500 MG tablet Commonly known as: GLUCOPHAGE TAKE ONE TABLET BY MOUTH TWICE DAILY WITH A MEAL   mirabegron ER 50 MG Tb24 tablet Commonly known as: MYRBETRIQ Take 1 tablet (50 mg total) by mouth daily.   montelukast 10 MG tablet Commonly known as: SINGULAIR TAKE 1 TABLET BY MOUTH DAILY   pravastatin 40 MG tablet Commonly known as: PRAVACHOL Take 40 mg by mouth at bedtime.   PROBIOTIC ACIDOPHILUS PO Take 1 capsule by mouth daily.   ramipril 5 MG capsule Commonly known as: ALTACE Take by mouth.   triamcinolone 0.1 % Commonly known as: KENALOG Use topically BID prn       Allergies:  Allergies   Allergen Reactions  . Ibuprofen     Other reaction(s): Abdominal Pain Can't take NSAIDS secondary to erosions due to NSAIDS.  Marland Kitchen Nsaids     Can't take NSAIDS secondary to erosions due to NSAIDS.    Family History: Family History  Problem Relation Age of Onset  . Heart attack Mother   . Stroke Mother   . Diabetes Mother   . Throat cancer Mother   . Heart attack Father   . Hypertension Father   . Glaucoma Father   . AAA (abdominal aortic aneurysm) Father   . Obesity Sister   . Throat cancer Maternal Grandmother   . Heart attack Maternal Grandfather   . CVA Paternal Grandmother   . Congestive Heart Failure Paternal Grandmother   . Heart attack Paternal Grandfather     Social History:  reports that she has never smoked. She has never used smokeless tobacco. She reports that she does not drink alcohol and does not use drugs.   Physical Exam: BP 130/77   Pulse 61   Ht 5\' 4"  (1.626 m)   Wt 208 lb (94.3 kg)   BMI 35.70 kg/m   Constitutional:  Alert and oriented, No acute distress. HEENT: [place] AT, moist mucus membranes.  Trachea midline, no masses. Cardiovascular: No clubbing, cyanosis, or edema. Respiratory: Normal respiratory effort, no increased work of breathing.. Skin: No rashes, bruises or suspicious lesions. Neurologic: Grossly intact, no focal deficits, moving all 4 extremities. Psychiatric: Normal mood and affect.  Pertinent Imaging: Results for orders placed or performed in visit on 12/10/20  BLADDER SCAN AMB NON-IMAGING  Result Value Ref Range   Scan Result 68ml     Assessment & Plan:    1. OAB (overactive bladder) Doing well on Myrbetriq 50 mg which is been able to adequately control her daytime symptoms primarily without side effect  Bladder scan adequate today  We will continue his medication, refilled for the next year  - BLADDER SCAN AMB NON-IMAGING - mirabegron ER (MYRBETRIQ) 50 MG TB24 tablet; Take 1 tablet (50 mg total) by mouth daily.  Dispense:  90 tablet; Refill: 3  2. Nocturia Nocturia times multiple  We discussed behavioral modification.  She does avoid drinking beverages after about 8:00 PM and goes to bed around 1130.  Given her history of snoring, asked her to consider discussing sleep study with her primary care physician.  We discussed the pathophysiology of primarily nighttime urinary symptoms as relates to sleep apnea.  She will discuss this further at her next follow-up.   Return in about 1 year (around 12/10/2021) for 1year w/PVR w/PA.  [name], MD  [facility] [address] [place], [place] [address] [phone]

## 2021-01-04 ENCOUNTER — Other Ambulatory Visit: Payer: Self-pay | Admitting: Physician Assistant

## 2021-01-04 DIAGNOSIS — J309 Allergic rhinitis, unspecified: Secondary | ICD-10-CM

## 2021-01-07 LAB — HM DIABETES EYE EXAM

## 2021-01-20 ENCOUNTER — Other Ambulatory Visit (INDEPENDENT_AMBULATORY_CARE_PROVIDER_SITE_OTHER): Payer: Self-pay | Admitting: Vascular Surgery

## 2021-01-20 DIAGNOSIS — M79671 Pain in right foot: Secondary | ICD-10-CM

## 2021-01-21 ENCOUNTER — Ambulatory Visit (INDEPENDENT_AMBULATORY_CARE_PROVIDER_SITE_OTHER): Payer: 59

## 2021-01-21 ENCOUNTER — Other Ambulatory Visit: Payer: Self-pay

## 2021-01-21 ENCOUNTER — Encounter (INDEPENDENT_AMBULATORY_CARE_PROVIDER_SITE_OTHER): Payer: Self-pay | Admitting: Vascular Surgery

## 2021-01-21 ENCOUNTER — Encounter: Payer: Self-pay | Admitting: Physician Assistant

## 2021-01-21 ENCOUNTER — Ambulatory Visit (INDEPENDENT_AMBULATORY_CARE_PROVIDER_SITE_OTHER): Payer: 59 | Admitting: Vascular Surgery

## 2021-01-21 VITALS — BP 133/82 | HR 60 | Resp 16 | Ht 64.0 in | Wt 206.0 lb

## 2021-01-21 DIAGNOSIS — I10 Essential (primary) hypertension: Secondary | ICD-10-CM

## 2021-01-21 DIAGNOSIS — E119 Type 2 diabetes mellitus without complications: Secondary | ICD-10-CM | POA: Diagnosis not present

## 2021-01-21 DIAGNOSIS — M79671 Pain in right foot: Secondary | ICD-10-CM | POA: Diagnosis not present

## 2021-01-21 DIAGNOSIS — E7800 Pure hypercholesterolemia, unspecified: Secondary | ICD-10-CM

## 2021-01-21 DIAGNOSIS — K219 Gastro-esophageal reflux disease without esophagitis: Secondary | ICD-10-CM | POA: Diagnosis not present

## 2021-01-21 DIAGNOSIS — M79606 Pain in leg, unspecified: Secondary | ICD-10-CM

## 2021-01-24 ENCOUNTER — Encounter (INDEPENDENT_AMBULATORY_CARE_PROVIDER_SITE_OTHER): Payer: Self-pay | Admitting: Vascular Surgery

## 2021-01-24 DIAGNOSIS — M79606 Pain in leg, unspecified: Secondary | ICD-10-CM | POA: Insufficient documentation

## 2021-01-24 NOTE — Progress Notes (Signed)
MRN : [id number]  Virginia Weaver is a 70 y.o. (January 05, 1951) female who presents with chief complaint of  Chief Complaint  Patient presents with  . New Patient (Initial Visit)    Right foot pain / u/s  .  History of Present Illness:   The patient is seen for evaluation of painful lower extremities. Patient notes the pain is variable and not always associated with activity.  The pain is somewhat consistent day to day occurring on most days. The patient notes the pain also occurs with standing and routinely seems worse as the day wears on. The pain has been progressive over the past several years. The patient states these symptoms are causing  a profound negative impact on quality of life and daily activities.  The patient denies rest pain or dangling of an extremity off the side of the bed during the night for relief. No open wounds or sores at this time. No history of DVT or phlebitis. No prior interventions or surgeries.  There is a  history of back problems and DJD of the lumbar and sacral spine.    Current Meds  Medication Sig  . ALPRAZolam (XANAX) 0.5 MG tablet TAKE ONE TABLET BY MOUTH EVERY EIGHT HOURS AS NEEDED  . calcium carbonate (OS-CAL - DOSED IN MG OF ELEMENTAL CALCIUM) 1250 (500 Ca) MG tablet Take 1 tablet by mouth daily with breakfast.  . Cholecalciferol 1000 UNITS capsule Take 500 Units by mouth daily.   . Cinnamon 500 MG capsule Take 500 mg by mouth daily.  . Coenzyme Q10 (COQ10) 200 MG CAPS Take 1 capsule by mouth daily.  . cyanocobalamin 1000 MCG tablet Take 1 tablet by mouth daily.  Marland Kitchen gabapentin (NEURONTIN) 100 MG capsule TAKE 1 CAPSULE EVERY MORNING  AND TAKE 2 CAPSULES AT BEDTIME  . hydrocortisone (ANUSOL-HC) 25 MG suppository UNWRAP AND INSERT 1 SUPPOSITORY RECALLY TWICE A DAY  . Lactobacillus (PROBIOTIC ACIDOPHILUS PO) Take 1 capsule by mouth daily.  Marland Kitchen levocetirizine (XYZAL) 5 MG tablet TAKE 1 TABLET BY MOUTH DAILY  . metFORMIN (GLUCOPHAGE) 500 MG tablet  TAKE ONE TABLET BY MOUTH TWICE DAILY WITH A MEAL  . Omega-3 Fatty Acids (FISH OIL PO) Take by mouth.  . pravastatin (PRAVACHOL) 40 MG tablet Take 40 mg by mouth at bedtime.  . ramipril (ALTACE) 5 MG capsule Take by mouth.  . triamcinolone cream (KENALOG) 0.1 % Use topically BID prn    Past Medical History:  Diagnosis Date  . Acid reflux 04/02/2001  . Adiposity 04/20/1993  . Benign essential HTN 11/11/1993  . Diabetes mellitus without complication (HCC)    diet   . Heart murmur   . Hypertension   . Syncope and collapse 01/20/2009  . Tinnitus    left ear  . Urticaria 06/04/2015    Past Surgical History:  Procedure Laterality Date  . CYSTOSCOPY WITH BIOPSY N/A 12/22/2015   Procedure: CYSTOSCOPY WITH BIOPSY;  Surgeon: [name], MD;  Location: [facility] ORS;  Service: Urology;  Laterality: N/A;  . TUBAL LIGATION  1987  . TUMMY TUCK  06/17/2010    Social History Social History   Tobacco Use  . Smoking status: Never Smoker  . Smokeless tobacco: Never Used  Vaping Use  . Vaping Use: Never used  Substance Use Topics  . Alcohol use: No    Alcohol/week: 0.0 standard drinks  . Drug use: No    Family History Family History  Problem Relation Age of Onset  . Heart attack Mother   .  Stroke Mother   . Diabetes Mother   . Throat cancer Mother   . Heart attack Father   . Hypertension Father   . Glaucoma Father   . AAA (abdominal aortic aneurysm) Father   . Obesity Sister   . Throat cancer Maternal Grandmother   . Heart attack Maternal Grandfather   . CVA Paternal Grandmother   . Congestive Heart Failure Paternal Grandmother   . Heart attack Paternal Grandfather   No family history of bleeding/clotting disorders, porphyria or autoimmune disease   Allergies  Allergen Reactions  . Ibuprofen     Other reaction(s): Abdominal Pain Can't take NSAIDS secondary to erosions due to NSAIDS.  Marland Kitchen Nsaids     Can't take NSAIDS secondary to erosions due to NSAIDS.     REVIEW OF  SYSTEMS (Negative unless checked)  Constitutional: [] Weight loss  [] Fever  [] Chills Cardiac: [] Chest pain   [] Chest pressure   [] Palpitations   [] Shortness of breath when laying flat   [] Shortness of breath with exertion. Vascular:  [x] Pain in legs with walking   [x] Pain in legs at rest  [] History of DVT   [] Phlebitis   [] Swelling in legs   [] Varicose veins   [] Non-healing ulcers Pulmonary:   [] Uses home oxygen   [] Productive cough   [] Hemoptysis   [] Wheeze  [] COPD   [] Asthma Neurologic:  [] Dizziness   [] Seizures   [] History of stroke   [] History of TIA  [] Aphasia   [] Vissual changes   [] Weakness or numbness in arm   [] Weakness or numbness in leg Musculoskeletal:   [] Joint swelling   [] Joint pain   [] Low back pain Hematologic:  [] Easy bruising  [] Easy bleeding   [] Hypercoagulable state   [] Anemic Gastrointestinal:  [] Diarrhea   [] Vomiting  [x] Gastroesophageal reflux/heartburn   [] Difficulty swallowing. Genitourinary:  [] Chronic kidney disease   [] Difficult urination  [] Frequent urination   [] Blood in urine Skin:  [] Rashes   [] Ulcers  Psychological:  [] History of anxiety   []  History of major depression.  Physical Examination  Vitals:   01/21/21 1322  BP: 133/82  Pulse: 60  Resp: 16  Weight: 206 lb (93.4 kg)  Height: 5\' 4"  (1.626 m)   Body mass index is 35.36 kg/m. Gen: WD/WN, NAD Head: [place]/AT, No temporalis wasting.  Ear/Nose/Throat: Hearing grossly intact, nares w/o erythema or drainage, poor dentition Eyes: PER, EOMI, sclera nonicteric.  Neck: Supple, no masses.  No bruit or JVD.  Pulmonary:  Good air movement, clear to auscultation bilaterally, no use of accessory muscles.  Cardiac: RRR, normal S1, S2, no Murmurs. Vascular:  Vessel Right Left  Radial Palpable Palpable  PT Palpable Palpable  DP Palpable Palpable  Gastrointestinal: soft, non-distended. No guarding/no peritoneal signs.  Musculoskeletal: M/S 5/5 throughout.  No deformity or atrophy.  Neurologic: CN 2-12 intact.  Pain and light touch intact in extremities.  Symmetrical.  Speech is fluent. Motor exam as listed above. Psychiatric: Judgment intact, Mood & affect appropriate for pt's clinical situation. Dermatologic: No rashes or ulcers noted.  No changes consistent with cellulitis.   CBC Lab Results  Component Value Date   WBC 3.5 11/17/2020   HGB 13.9 11/17/2020   HCT 42.1 11/17/2020   MCV 91 11/17/2020   PLT 185 11/17/2020    BMET    Component Value Date/Time   NA 141 11/17/2020 1034   K 4.5 11/17/2020 1034   CL 103 11/17/2020 1034   CO2 25 11/17/2020 1034   GLUCOSE 135 (H) 11/17/2020 1034   BUN 17  11/17/2020 1034   CREATININE 0.81 11/17/2020 1034   CALCIUM 9.2 11/17/2020 1034   GFRNONAA 75 11/17/2020 1034   GFRAA 87 11/17/2020 1034   CrCl cannot be calculated (Patient's most recent lab result is older than the maximum 21 days allowed.).  COAG No results found for: INR, PROTIME  Radiology VAS [place] ABI WITH/WO TBI  Result Date: 01/21/2021 LOWER EXTREMITY DOPPLER STUDY Indications: Right foot pain.  Performing Technologist: Concha Norway RVT  Examination Guidelines: A complete evaluation includes at minimum, Doppler waveform signals and systolic blood pressure reading at the level of bilateral brachial, anterior tibial, and posterior tibial arteries, when vessel segments are accessible. Bilateral testing is considered an integral part of a complete examination. Photoelectric Plethysmograph (PPG) waveforms and toe systolic pressure readings are included as required and additional duplex testing as needed. Limited examinations for reoccurring indications may be performed as noted.  ABI Findings: +---------+------------------+-----+---------+--------+ Right    Rt Pressure (mmHg)IndexWaveform Comment  +---------+------------------+-----+---------+--------+ Brachial 160                                      +---------+------------------+-----+---------+--------+ ATA      178                1.11 triphasic         +---------+------------------+-----+---------+--------+ PTA      192               1.20 triphasic         +---------+------------------+-----+---------+--------+ Great Toe145               0.91 Normal            +---------+------------------+-----+---------+--------+ +---------+------------------+-----+---------+-------+ Left     Lt Pressure (mmHg)IndexWaveform Comment +---------+------------------+-----+---------+-------+ ATA      179               1.12 triphasic        +---------+------------------+-----+---------+-------+ PTA      173               1.08 triphasic        +---------+------------------+-----+---------+-------+ Great Toe149               0.93 Normal           +---------+------------------+-----+---------+-------+  Summary: Right: Resting right ankle-brachial index is within normal range. No evidence of significant right lower extremity arterial disease. The right toe-brachial index is normal. Left: Resting left ankle-brachial index is within normal range. No evidence of significant left lower extremity arterial disease. The left toe-brachial index is normal.  *See table(s) above for measurements and observations.  Electronically signed by Hortencia Pilar MD on 01/21/2021 at 4:28:44 PM.   Final      Assessment/Plan 1. Pain of lower extremity, unspecified laterality Recommend:  I do not find evidence of Vascular pathology that would explain the patient's symptoms  The patient has atypical pain symptoms for vascular disease  I do not find evidence of Vascular pathology that would explain the patient's symptoms and I suspect the patient is c/o pseudoclaudication.  Patient should have an evaluation of his LS spine which I defer to the primary service.  Noninvasive studies including venous ultrasound of the legs do not identify vascular problems  The patient should continue walking and begin a more formal exercise program. The  patient should continue his antiplatelet therapy and aggressive treatment of the lipid abnormalities. The patient should  begin wearing graduated compression socks 15-20 mmHg strength to control her mild edema.  Patient will follow-up with me on a PRN basis  Further work-up of her lower extremity pain is deferred to the primary service     2. Primary hypertension Continue antihypertensive medications as already ordered, these medications have been reviewed and there are no changes at this time.   3. Gastroesophageal reflux disease, unspecified whether esophagitis present Continue PPI as already ordered, this medication has been reviewed and there are no changes at this time.  Avoidence of caffeine and alcohol  Moderate elevation of the head of the bed   4. Diabetes mellitus without complication ([place]) Continue hypoglycemic medications as already ordered, these medications have been reviewed and there are no changes at this time.  Hgb A1C to be monitored as already arranged by primary service   5. Hypercholesteremia Continue statin as ordered and reviewed, no changes at this time     Hortencia Pilar, MD  01/24/2021 4:25 PM

## 2021-03-10 ENCOUNTER — Other Ambulatory Visit: Payer: Self-pay | Admitting: Physician Assistant

## 2021-03-10 DIAGNOSIS — F411 Generalized anxiety disorder: Secondary | ICD-10-CM

## 2021-03-10 NOTE — Telephone Encounter (Signed)
Requested medication (s) are due for refill today:   Provider to review  Requested medication (s) are on the active medication list:   Yes  Future visit scheduled:   No former Fenton Malling pt.  No future appts noted with any providers   Last ordered: 06/30/2020 #90, 5 refills  Non delegated refill    Requested Prescriptions  Pending Prescriptions Disp Refills   ALPRAZolam (XANAX) 0.5 MG tablet [Pharmacy Med Name: ALPRAZOLAM 0.5 MG TAB] 90 tablet     Sig: TAKE ONE TABLET BY MOUTH EVERY EIGHT HOURS AS NEEDED      Not Delegated - Psychiatry:  Anxiolytics/Hypnotics Failed - 03/10/2021 12:42 PM      Failed - This refill cannot be delegated      Failed - Urine Drug Screen completed in last 360 days      Passed - Valid encounter within last 6 months    Recent Outpatient Visits           4 months ago Acute non-recurrent pansinusitis   [facility] [place], [place], [place]   6 months ago [place], [place]   7 months ago Low back pain, unspecified back pain laterality, unspecified chronicity, unspecified whether sciatica present   [place], [place]   11 months ago Routine general medical examination at a health care facility   [place], [place], [place]   1 year ago Skin ulcer, limited to breakdown of skin [facility])   [place], [place], [place]       Future Appointments             In 9 months [place], [facility]

## 2021-04-19 ENCOUNTER — Other Ambulatory Visit: Payer: Self-pay

## 2021-04-19 ENCOUNTER — Encounter: Payer: Self-pay | Admitting: Family Medicine

## 2021-04-19 ENCOUNTER — Ambulatory Visit (INDEPENDENT_AMBULATORY_CARE_PROVIDER_SITE_OTHER): Payer: 59 | Admitting: Family Medicine

## 2021-04-19 VITALS — BP 134/85 | HR 60 | Temp 98.4°F | Resp 16 | Ht 64.0 in | Wt 206.0 lb

## 2021-04-19 DIAGNOSIS — E119 Type 2 diabetes mellitus without complications: Secondary | ICD-10-CM

## 2021-04-19 DIAGNOSIS — I10 Essential (primary) hypertension: Secondary | ICD-10-CM | POA: Diagnosis not present

## 2021-04-19 LAB — POCT GLYCOSYLATED HEMOGLOBIN (HGB A1C)
Est. average glucose Bld gHb Est-mCnc: 123
Hemoglobin A1C: 5.9 % — AB (ref 4.0–5.6)
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
## 2021-04-20 LAB — COMPREHENSIVE METABOLIC PANEL
ALT: 14 IU/L (ref 0–32)
AST: 22 IU/L (ref 0–40)
Albumin/Globulin Ratio: 2.4 — ABNORMAL HIGH (ref 1.2–2.2)
Albumin: 4.5 g/dL (ref 3.8–4.8)
Alkaline Phosphatase: 64 IU/L (ref 44–121)
BUN/Creatinine Ratio: 24 (ref 12–28)
BUN: 20 mg/dL (ref 8–27)
Bilirubin Total: 0.6 mg/dL (ref 0.0–1.2)
CO2: 26 mmol/L (ref 20–29)
Calcium: 9.9 mg/dL (ref 8.7–10.3)
Chloride: 100 mmol/L (ref 96–106)
Creatinine, Ser: 0.83 mg/dL (ref 0.57–1.00)
Globulin, Total: 1.9 g/dL (ref 1.5–4.5)
Glucose: 164 mg/dL — ABNORMAL HIGH (ref 65–99)
Potassium: 4.2 mmol/L (ref 3.5–5.2)
Sodium: 140 mmol/L (ref 134–144)
Total Protein: 6.4 g/dL (ref 6.0–8.5)
eGFR: 77 mL/min/{1.73_m2} (ref >59)

## 2021-04-20 LAB — CBC WITH DIFFERENTIAL/PLATELET
Basophils Absolute: 0.1 10*3/uL (ref 0.0–0.2)
Basos: 1 %
EOS (ABSOLUTE): 0.1 10*3/uL (ref 0.0–0.4)
Eos: 3 %
Hematocrit: 41.3 % (ref 34.0–46.6)
Hemoglobin: 13.7 g/dL (ref 11.1–15.9)
Immature Grans (Abs): 0 10*3/uL (ref 0.0–0.1)
Immature Granulocytes: 0 %
Lymphocytes Absolute: 1.2 10*3/uL (ref 0.7–3.1)
Lymphs: 29 %
MCH: 30.4 pg (ref 26.6–33.0)
MCHC: 33.2 g/dL (ref 31.5–35.7)
MCV: 92 fL (ref 79–97)
Monocytes Absolute: 0.3 10*3/uL (ref 0.1–0.9)
Monocytes: 7 %
Neutrophils Absolute: 2.5 10*3/uL (ref 1.4–7.0)
Neutrophils: 60 %
Platelets: 171 10*3/uL (ref 150–450)
RBC: 4.51 x10E6/uL (ref 3.77–5.28)
RDW: 12.5 % (ref 11.7–15.4)
WBC: 4.2 10*3/uL (ref 3.4–10.8)

## 2021-04-20 LAB — LIPID PANEL
Chol/HDL Ratio: 2.4 ratio (ref 0.0–4.4)
Cholesterol, Total: 165 mg/dL (ref 100–199)
HDL: 68 mg/dL (ref >39)
LDL Chol Calc (NIH): 80 mg/dL (ref 0–99)
Triglycerides: 90 mg/dL (ref 0–149)
VLDL Cholesterol Cal: 17 mg/dL (ref 5–40)

## 2021-04-20 LAB — TSH: TSH: 1.45 u[IU]/mL (ref 0.450–4.500)

## 2021-04-22 ENCOUNTER — Other Ambulatory Visit: Payer: Self-pay | Admitting: Physician Assistant

## 2021-04-22 DIAGNOSIS — J309 Allergic rhinitis, unspecified: Secondary | ICD-10-CM

## 2021-05-18 ENCOUNTER — Other Ambulatory Visit: Payer: Self-pay | Admitting: Family Medicine

## 2021-05-18 ENCOUNTER — Other Ambulatory Visit: Payer: Self-pay | Admitting: Physician Assistant

## 2021-05-18 DIAGNOSIS — L409 Psoriasis, unspecified: Secondary | ICD-10-CM

## 2021-05-18 DIAGNOSIS — F411 Generalized anxiety disorder: Secondary | ICD-10-CM

## 2021-05-18 NOTE — Telephone Encounter (Signed)
Requested medications are due for refill today.  yes  Requested medications are on the active medications list.  yes  Last refill. 03/10/2021  Future visit scheduled.   no  Notes to clinic.  Pt was seeing Fenton Malling.

## 2021-05-27 ENCOUNTER — Other Ambulatory Visit: Payer: Self-pay | Admitting: Physician Assistant

## 2021-05-27 DIAGNOSIS — E1129 Type 2 diabetes mellitus with other diabetic kidney complication: Secondary | ICD-10-CM

## 2021-06-10 ENCOUNTER — Encounter: Payer: Self-pay | Admitting: Physician Assistant

## 2021-06-29 ENCOUNTER — Ambulatory Visit: Payer: Managed Care, Other (non HMO) | Admitting: Adult Health

## 2021-07-06 ENCOUNTER — Other Ambulatory Visit: Payer: Self-pay | Admitting: Family Medicine

## 2021-07-06 DIAGNOSIS — J309 Allergic rhinitis, unspecified: Secondary | ICD-10-CM

## 2021-07-06 NOTE — Telephone Encounter (Signed)
Requested medications are due for refill today.  yes  Requested medications are on the active medications list.  yes  Last refill. 04/22/2021  Future visit scheduled.   Yes. She is seeing Ms. Flinchum at [facility] 07/19/2021, And Mr. Chrismon 10/31/2021.  Notes to clinic.  PCP is listed as Fenton Malling

## 2021-07-19 ENCOUNTER — Ambulatory Visit: Payer: Managed Care, Other (non HMO) | Admitting: Adult Health

## 2021-08-03 ENCOUNTER — Telehealth: Payer: Self-pay | Admitting: Physician Assistant

## 2021-08-03 NOTE — Telephone Encounter (Signed)
[facility] faxed refill request for the following medications:   ALPRAZolam (XANAX) 0.5 MG tablet    Please advise.  

## 2021-08-05 ENCOUNTER — Other Ambulatory Visit: Payer: Self-pay

## 2021-08-05 DIAGNOSIS — F411 Generalized anxiety disorder: Secondary | ICD-10-CM

## 2021-08-05 MED ORDER — ALPRAZOLAM 0.5 MG PO TABS: 0.5000 mg | ORAL_TABLET | Freq: Three times a day (TID) | ORAL | 0 refills | Status: DC | PRN

## 2021-08-07 ENCOUNTER — Telehealth: Payer: Managed Care, Other (non HMO)

## 2021-08-23 ENCOUNTER — Other Ambulatory Visit: Payer: Self-pay | Admitting: Family Medicine

## 2021-08-23 DIAGNOSIS — Z1231 Encounter for screening mammogram for malignant neoplasm of breast: Secondary | ICD-10-CM

## 2021-08-24 DIAGNOSIS — U071 COVID-19: Secondary | ICD-10-CM

## 2021-08-24 HISTORY — DX: COVID-19: U07.1

## 2021-09-10 ENCOUNTER — Ambulatory Visit: Payer: Managed Care, Other (non HMO) | Admitting: Physician Assistant

## 2021-09-10 ENCOUNTER — Other Ambulatory Visit: Payer: Self-pay

## 2021-09-10 ENCOUNTER — Encounter: Payer: Self-pay | Admitting: Physician Assistant

## 2021-09-10 VITALS — BP 179/80 | HR 66 | Ht 63.0 in | Wt 198.0 lb

## 2021-09-10 DIAGNOSIS — R300 Dysuria: Secondary | ICD-10-CM

## 2021-09-10 DIAGNOSIS — R3129 Other microscopic hematuria: Secondary | ICD-10-CM | POA: Diagnosis not present

## 2021-09-10 LAB — URINALYSIS, COMPLETE
Bilirubin, UA: NEGATIVE
Glucose, UA: NEGATIVE
Ketones, UA: NEGATIVE
Nitrite, UA: NEGATIVE
Protein,UA: NEGATIVE
Specific Gravity, UA: 1.02 (ref 1.005–1.030)
Urobilinogen, Ur: 0.2 mg/dL (ref 0.2–1.0)
pH, UA: 6 (ref 5.0–7.5)

## 2021-09-10 LAB — MICROSCOPIC EXAMINATION: Bacteria, UA: NONE SEEN

## 2021-09-10 NOTE — Progress Notes (Signed)
09/10/2021 2:39 PM   [name] Feb 02, 1951 [id number]  CC: Chief Complaint  Patient presents with   Dysuria   HPI: Virginia Weaver is a 70 y.o. female with PMH OAB wet previously on Myrbetriq 50 mg daily, nocturia, and microscopic hematuria with negative bladder biopsies in 2017 with Dr. Erlene Quan who presents today for evaluation of possible UTI.   Today she reports intermittent dysuria for the past several months with increasing frequency of episodes. She denies gross hematuria. She is a never smoker, no family history of urologic cancer.  She reports she had some insurance issues regarding Myrbetriq coverage and has stopped the medication since she was last seen in clinic.  In-office UA today positive for trace intact blood and trace leukocyte esterase; urine microscopy with 11-30 WBCs/HPF and 3-10 RBCs/HPF.  PMH: Past Medical History:  Diagnosis Date   Acid reflux 04/02/2001   Adiposity 04/20/1993   Benign essential HTN 11/11/1993   Diabetes mellitus without complication (HCC)    diet    Heart murmur    Hypertension    Syncope and collapse 01/20/2009   Tinnitus    left ear   Urticaria 06/04/2015    Surgical History: Past Surgical History:  Procedure Laterality Date   CYSTOSCOPY WITH BIOPSY N/A 12/22/2015   Procedure: CYSTOSCOPY WITH BIOPSY;  Surgeon: [name], MD;  Location: [facility] ORS;  Service: Urology;  Laterality: N/A;   TUBAL LIGATION  1987   TUMMY TUCK  06/17/2010    Home Medications:  Allergies as of 09/10/2021       Reactions   Ibuprofen    Other reaction(s): Abdominal Pain Can't take NSAIDS secondary to erosions due to NSAIDS.   Nsaids    Can't take NSAIDS secondary to erosions due to NSAIDS.        Medication List        Accurate as of September 10, 2021  2:39 PM. If you have any questions, ask your nurse or doctor.          STOP taking these medications    mirabegron ER 50 MG Tb24 tablet Commonly known as:  MYRBETRIQ Stopped by: Debroah Loop, PA-C       TAKE these medications    ALPRAZolam 0.5 MG tablet Commonly known as: XANAX Take 1 tablet (0.5 mg total) by mouth every 8 (eight) hours as needed.   calcium carbonate 1250 (500 Ca) MG tablet Commonly known as: OS-CAL - dosed in mg of elemental calcium Take 1 tablet by mouth daily with breakfast.   Cholecalciferol 25 MCG (1000 UT) capsule Take 500 Units by mouth daily.   Cinnamon 500 MG capsule Take 500 mg by mouth daily.   CoQ10 200 MG Caps Take 1 capsule by mouth daily.   cyanocobalamin 1000 MCG tablet Take 1 tablet by mouth daily.   FISH OIL PO Take by mouth.   gabapentin 100 MG capsule Commonly known as: NEURONTIN TAKE 1 CAPSULE EVERY MORNING  AND TAKE 2 CAPSULES AT BEDTIME   hydrocortisone 25 MG suppository Commonly known as: ANUSOL-HC UNWRAP AND INSERT 1 SUPPOSITORY RECALLY TWICE A DAY   Lagevrio 200 MG Caps capsule Generic drug: molnupiravir EUA Take 4 capsules by mouth 2 (two) times daily.   levocetirizine 5 MG tablet Commonly known as: XYZAL TAKE 1 TABLET BY MOUTH DAILY   metFORMIN 500 MG tablet Commonly known as: GLUCOPHAGE TAKE ONE TABLET BY MOUTH TWICE DAILY WITH A MEAL   montelukast 10 MG tablet Commonly known as: SINGULAIR TAKE  1 TABLET BY MOUTH DAILY   pravastatin 40 MG tablet Commonly known as: PRAVACHOL Take 40 mg by mouth at bedtime.   PROBIOTIC ACIDOPHILUS PO Take 1 capsule by mouth daily.   ramipril 5 MG capsule Commonly known as: ALTACE Take by mouth.   triamcinolone cream 0.1 % Commonly known as: KENALOG USE TOPICALLY TWICE DAILY AS NEEDED        Allergies:  Allergies  Allergen Reactions   Ibuprofen     Other reaction(s): Abdominal Pain Can't take NSAIDS secondary to erosions due to NSAIDS.   Nsaids     Can't take NSAIDS secondary to erosions due to NSAIDS.    Family History: Family History  Problem Relation Age of Onset   Heart attack Mother    Stroke  Mother    Diabetes Mother    Throat cancer Mother    Heart attack Father    Hypertension Father    Glaucoma Father    AAA (abdominal aortic aneurysm) Father    Obesity Sister    Throat cancer Maternal Grandmother    Heart attack Maternal Grandfather    CVA Paternal Grandmother    Congestive Heart Failure Paternal Grandmother    Heart attack Paternal Grandfather     Social History:   reports that she has never smoked. She has never used smokeless tobacco. She reports that she does not drink alcohol and does not use drugs.  Physical Exam: BP (!) 179/80   Pulse 66   Ht 5\' 3"  (1.6 m)   Wt 198 lb (89.8 kg)   BMI 35.07 kg/m   Constitutional:  Alert and oriented, no acute distress, nontoxic appearing HEENT: [place], AT Cardiovascular: No clubbing, cyanosis, or edema Respiratory: Normal respiratory effort, no increased work of breathing Skin: No rashes, bruises or suspicious lesions Neurologic: Grossly intact, no focal deficits, moving all 4 extremities Psychiatric: Normal mood and affect  Laboratory Data: Results for orders placed or performed in visit on 09/10/21  CULTURE, URINE COMPREHENSIVE   Specimen: Urine   UR  Result Value Ref Range   Urine Culture, Comprehensive Preliminary report    Organism ID, Bacteria Comment   Microscopic Examination   Urine  Result Value Ref Range   WBC, UA 11-30 (A) 0 - 5 /hpf   RBC 3-10 (A) 0 - 2 /hpf   Epithelial Cells (non renal) 0-10 0 - 10 /hpf   Bacteria, UA None seen None seen/Few  Urinalysis, Complete  Result Value Ref Range   Specific Gravity, UA 1.020 1.005 - 1.030   pH, UA 6.0 5.0 - 7.5   Color, UA Yellow Yellow   Appearance Ur Clear Clear   Leukocytes,UA Trace (A) Negative   Protein,UA Negative Negative/Trace   Glucose, UA Negative Negative   Ketones, UA Negative Negative   RBC, UA Trace (A) Negative   Bilirubin, UA Negative Negative   Urobilinogen, Ur 0.2 0.2 - 1.0 mg/dL   Nitrite, UA Negative Negative   Microscopic  Examination See below:    Assessment & Plan:   1. Dysuria Intermittent, chronic but increasing in frequency.  UA today notable for pyuria and microscopic hematuria.  Will send for culture and treat as indicated. - Urinalysis, Complete - CULTURE, URINE COMPREHENSIVE  2. Microscopic hematuria Possibly consistent with cystitis as above, however given her history, we discussed that if her culture is negative I will recommend we proceed with a repeat hematuria work-up.  She is in agreement with this plan.  Return for Will call with results.  Debroah Loop, PA-C  [facility] [address], [place], [place] [address] [phone]

## 2021-09-14 ENCOUNTER — Telehealth: Payer: Self-pay

## 2021-09-14 ENCOUNTER — Other Ambulatory Visit: Payer: Self-pay | Admitting: Physician Assistant

## 2021-09-14 DIAGNOSIS — R3129 Other microscopic hematuria: Secondary | ICD-10-CM

## 2021-09-14 LAB — CULTURE, URINE COMPREHENSIVE

## 2021-09-14 NOTE — Telephone Encounter (Signed)
-----   Message from Debroah Loop, [place] sent at 09/14/2021  9:27 AM EST ----- Urine culture is negative for infection. Recommend proceeding with hematuria workup as discussed in clinic: CT urogram with use of IV contrast with plans for follow-up cystoscopy with CT results with Dr. Erlene Quan. ----- Message ----- From: Lavone Neri Lab Results In Sent: 09/10/2021   4:36 PM EST To: Debroah Loop, PA-C

## 2021-09-14 NOTE — Telephone Encounter (Signed)
OK per DPR, [facility] notifying patient, asked patient to return call.

## 2021-09-15 NOTE — Telephone Encounter (Signed)
Notified patient as instructed, patient pleased. Discussed follow-up appointments, patient agrees  

## 2021-09-23 DIAGNOSIS — B338 Other specified viral diseases: Secondary | ICD-10-CM

## 2021-09-23 HISTORY — DX: Other specified viral diseases: B33.8

## 2021-09-24 ENCOUNTER — Other Ambulatory Visit: Payer: Self-pay

## 2021-09-24 ENCOUNTER — Ambulatory Visit
Admission: RE | Admit: 2021-09-24 | Discharge: 2021-09-24 | Disposition: A | Discharge status: Home / Self Care | Payer: Managed Care, Other (non HMO) | Source: Ambulatory Visit | Attending: Physician Assistant | Admitting: Physician Assistant

## 2021-09-24 DIAGNOSIS — R3129 Other microscopic hematuria: Secondary | ICD-10-CM | POA: Insufficient documentation

## 2021-09-24 LAB — POCT I-STAT CREATININE: Creatinine, Ser: 0.8 mg/dL (ref 0.44–1.00)

## 2021-09-24 MED ORDER — IOHEXOL 300 MG/ML  SOLN
100.0000 mL | Freq: Once | INTRAMUSCULAR | Status: AC | PRN
  Administered 2021-09-24: 100 mL via INTRAVENOUS

## 2021-09-28 NOTE — Progress Notes (Signed)
   09/29/21  CC:  Chief Complaint  Patient presents with   Cysto    HPI: Virginia Weaver is a 70 y.o.female with a personal history of OAB, nocturia, and microscopic hematuria, who presents today for a cystoscopy.   She is s/p negative bladder biopsy in 2017 that was consistent with chronic cystitis with lymphoid aggregates and mixed inflammatory infiltrates including eosinophils and plasma cells.  She is doing well today.    Vitals:   09/29/21 1616  BP: (!) 170/80  Pulse: 81  NED. A&Ox3.   No respiratory distress   Abd soft, NT, ND Normal external genitalia with patent urethral meatus  Cystoscopy Procedure Note  Patient identification was confirmed, informed consent was obtained, and patient was prepped using Betadine solution.  Lidocaine jelly was administered per urethral meatus.    Procedure: - Flexible cystoscope introduced, without any difficulty.   - Thorough search of the bladder revealed:    normal urethral meatus    no stones    no ulcers     no tumors    no urethral polyps    no trabeculation     Left interior lateral bladder wall has erythema, inflammation, and a small area of ulceration. There is some bleeding of this area with manipulation of the scope, no evert papillary tumor in the bladder  - Ureteral orifices were normal in position and appearance.  Post-Procedure: - Patient tolerated the procedure well  Assessment/ Plan:  Chronic cystitis - bladder appears to have stable chronic cystitis which she had previously with negative biopsy - Given slight ulceration and bleeding of this area may be consistent with Hunters ulcers which she might benefit from repeat biopsy and fulguration of these.  - Urine sent for cytology and culture   2. Right hydronephrosis  - Mild with subtle transition  - Not previously appreciated  - Recommend she undergo retrograde and diagnostic ureteroscopy in the OR to rule out underlying pathology. Discussed with her  that she may require a stent post-op    I,Kailey Littlejohn,acting as a scribe for [name], MD.,have documented all relevant documentation on the behalf of [name], MD,as directed by  [name], MD while in the presence of [name], MD.  I have reviewed the above documentation for accuracy and completeness, and I agree with the above.   [name], MD

## 2021-09-29 ENCOUNTER — Ambulatory Visit: Payer: Managed Care, Other (non HMO) | Admitting: Dermatology

## 2021-09-29 ENCOUNTER — Ambulatory Visit: Payer: Managed Care, Other (non HMO) | Admitting: Urology

## 2021-09-29 ENCOUNTER — Other Ambulatory Visit: Payer: Self-pay

## 2021-09-29 VITALS — BP 170/80 | HR 81 | Ht 63.0 in | Wt 198.0 lb

## 2021-09-29 DIAGNOSIS — R3129 Other microscopic hematuria: Secondary | ICD-10-CM

## 2021-09-30 LAB — URINALYSIS, COMPLETE
Bilirubin, UA: NEGATIVE
Glucose, UA: NEGATIVE
Ketones, UA: NEGATIVE
Nitrite, UA: NEGATIVE
Protein,UA: NEGATIVE
Specific Gravity, UA: 1.015 (ref 1.005–1.030)
Urobilinogen, Ur: 0.2 mg/dL (ref 0.2–1.0)
pH, UA: 7 (ref 5.0–7.5)

## 2021-09-30 LAB — MICROSCOPIC EXAMINATION: Bacteria, UA: NONE SEEN

## 2021-10-01 ENCOUNTER — Other Ambulatory Visit: Payer: Self-pay | Admitting: Family Medicine

## 2021-10-01 DIAGNOSIS — J309 Allergic rhinitis, unspecified: Secondary | ICD-10-CM

## 2021-10-01 LAB — CYTOLOGY - NON PAP

## 2021-10-02 LAB — CULTURE, URINE COMPREHENSIVE

## 2021-10-04 ENCOUNTER — Other Ambulatory Visit: Payer: Self-pay | Admitting: Urology

## 2021-10-04 DIAGNOSIS — N329 Bladder disorder, unspecified: Secondary | ICD-10-CM

## 2021-10-04 DIAGNOSIS — R310 Gross hematuria: Secondary | ICD-10-CM

## 2021-10-04 DIAGNOSIS — N1330 Unspecified hydronephrosis: Secondary | ICD-10-CM

## 2021-10-04 NOTE — Progress Notes (Signed)
Surgical Physician Order Form [facility] Virginia Weaver  * Scheduling expectation : Next Available  *Length of Case:   *Clearance needed: no  *Anticoagulation Instructions: May continue all anticoagulants  *Aspirin Instructions: Ok to continue all  *Post-op visit Date/Instructions:  1 month follow up  *Diagnosis: Bladder Lesion, right hydronephrosis, hematuria  *Procedure: bilateral  retrograde pyelogram, possible diagnostic right ureteroscopy, possible right ureteral stent placement, possible right ureteral biopsy, bladder biopsy, bladder fulguration   Additional orders: N/A  -Admit type: OUTpatient  -Anesthesia: General  -VTE Prophylaxis Standing Order SCD's       Other:   -Standing Lab Orders Per Anesthesia    Lab other: None  -Standing Test orders EKG/Chest x-ray per Anesthesia       Test other:   - Medications:  Ancef 2gm IV  -Other orders:  N/A

## 2021-10-05 ENCOUNTER — Ambulatory Visit: Payer: Managed Care, Other (non HMO) | Admitting: Dermatology

## 2021-10-08 ENCOUNTER — Other Ambulatory Visit: Payer: Self-pay | Admitting: Family Medicine

## 2021-10-08 DIAGNOSIS — F411 Generalized anxiety disorder: Secondary | ICD-10-CM

## 2021-10-08 MED ORDER — ALPRAZOLAM 0.5 MG PO TABS: 0.5000 mg | ORAL_TABLET | Freq: Every day | ORAL | 0 refills | Status: AC | PRN

## 2021-10-18 NOTE — Progress Notes (Signed)
[facility] Posting Form   Surgery Date/Time: Date: 11/04/2021  Surgeon: Dr. [name], MD  Surgery Location: Day Surgery  Inpt ( No  )   Outpt (Yes)   Obs ( No  )   Diagnosis: N32.9 Bladder Lesion, N13.30 Right Hydronephrosis, R31.0 Hematuria  -CPT: 74715, possible 52204, possible 52224, possible 52332, possible 52354  Surgery: Cystoscopy with bilateral retrograde pyelogram, possible diagnostic right ureteroscopy with possible right ureteral stent placement, possible right ureteral biopsy, possible bladder biopsy and possible bladder fulguration   Stop Anticoagulations: No, may continue ASA  Cardiac/Medical/Pulmonary Clearance needed:no  *Orders entered into EPIC  Date: 10/18/21   *Case booked in [place]  Date: 10/13/2021  *Notified pt of Surgery: Date: 10/13/2021  PRE-OP UA & CX: no  *Placed into Prior Authorization Work [place] Date: 10/18/21   Assistant/laser/rep:No

## 2021-10-19 ENCOUNTER — Other Ambulatory Visit: Payer: Managed Care, Other (non HMO)

## 2021-10-22 ENCOUNTER — Telehealth: Payer: Self-pay | Admitting: Physician Assistant

## 2021-10-22 NOTE — Telephone Encounter (Signed)
Lm to call office to schedule new patient appt with [facility].

## 2021-10-26 ENCOUNTER — Other Ambulatory Visit: Payer: Managed Care, Other (non HMO) | Admitting: Urology

## 2021-10-27 ENCOUNTER — Encounter
Admission: RE | Admit: 2021-10-27 | Discharge: 2021-10-27 | Disposition: A | Discharge status: Home / Self Care | Payer: Managed Care, Other (non HMO) | Source: Ambulatory Visit | Attending: Urology | Admitting: Urology

## 2021-10-27 ENCOUNTER — Other Ambulatory Visit: Payer: Self-pay

## 2021-10-27 HISTORY — DX: Unspecified osteoarthritis, unspecified site: M19.90

## 2021-10-27 HISTORY — DX: Anemia, unspecified: D64.9

## 2021-10-27 HISTORY — DX: Occlusion and stenosis of bilateral carotid arteries: I65.23

## 2021-10-27 NOTE — Patient Instructions (Signed)
Your procedure is scheduled on:11-04-21 Thursday Report to the Registration Desk on the 1st floor of the [facility].Then proceed to the 2nd floor Surgery Desk in the [facility] To find out your arrival time, please call [phone] between 1PM - 3PM on:11-03-21 Wednesday  REMEMBER: Instructions that are not followed completely may result in serious medical risk, up to and including death; or upon the discretion of your surgeon and anesthesiologist your surgery may need to be rescheduled.  Do not eat food OR drink any liquids after midnight the night before surgery.  No gum chewing, lozengers or hard candies.  TAKE THESE MEDICATIONS THE MORNING OF SURGERY WITH A SIP OF WATER: -mirabegron ER (MYRBETRIQ)  -levocetirizine (XYZAL)   Stop metFORMIN (GLUCOPHAGE) 2 days prior to surgery-Last dose 11-01-21 Monday  One week prior to surgery: Stop Anti-inflammatories (NSAIDS) such as Advil, Aleve, Ibuprofen, Motrin, Naproxen, Naprosyn and Aspirin based products such as Excedrin, Goodys Powder, BC Powder.You may however, continue to take Tylenol if needed for pain up until the day of surgery.  Stop ANY OVER THE COUNTER supplements/vitamins NOW (10-27-21) until after surgery (Multiple Vitamins-Minerals (HAIR/SKIN/NAILS), (ESTER C PO), calcium carbonate (OS-CAL), Cholecalciferol, Cinnamon, Coenzyme Q10 (COQ10) ,  Ginkgo Biloba , Lactobacillus (PROBIOTIC ACIDOPHILUS , Omega-3 Fatty Acids (FISH OIL) and SUPER B COMPLEX )  No Alcohol for 24 hours before or after surgery.  No Smoking including e-cigarettes for 24 hours prior to surgery.  No chewable tobacco products for at least 6 hours prior to surgery.  No nicotine patches on the day of surgery.  Do not use any "recreational" drugs for at least a week prior to your surgery.  Please be advised that the combination of cocaine and anesthesia may have negative outcomes, up to and including death. If you test positive for cocaine, your surgery will be  cancelled.  On the morning of surgery brush your teeth with toothpaste and water, you may rinse your mouth with mouthwash if you wish. Do not swallow any toothpaste or mouthwash.   Do not wear jewelry, make-up, hairpins, clips or nail polish.  Do not wear lotions, powders, or perfumes.   Do not shave body from the neck down 48 hours prior to surgery just in case you cut yourself which could leave a site for infection.  Also, freshly shaved skin may become irritated if using the CHG soap.  Contact lenses, hearing aids and dentures may not be worn into surgery.  Do not bring valuables to the hospital. [facility] is not responsible for any missing/lost belongings or valuables.   Notify your doctor if there is any change in your medical condition (cold, fever, infection).  Wear comfortable clothing (specific to your surgery type) to the hospital.  After surgery, you can help prevent lung complications by doing breathing exercises.  Take deep breaths and cough every 1-2 hours. Your doctor may order a device called an Incentive Spirometer to help you take deep breaths. When coughing or sneezing, hold a pillow firmly against your incision with both hands. This is called splinting. Doing this helps protect your incision. It also decreases belly discomfort.  If you are being admitted to the hospital overnight, leave your suitcase in the car. After surgery it may be brought to your room.  If you are being discharged the day of surgery, you will not be allowed to drive home. You will need a responsible adult (18 years or older) to drive you home and stay with you that night.  If you are taking public transportation, you will need to have a responsible adult (18 years or older) with you. Please confirm with your physician that it is acceptable to use public transportation.   Please call the [facility] at [phone] if you have any questions about these  instructions.  Surgery Visitation Policy:  Patients undergoing a surgery or procedure may have one family member or support person with them as long as that person is not COVID-19 positive or experiencing its symptoms.  That person may remain in the waiting area during the procedure and may rotate out with other people.  Inpatient Visitation:    Visiting hours are 7 a.m. to 8 p.m. Up to two visitors ages 16+ are allowed at one time in a patient room. The visitors may rotate out with other people during the day. Visitors must check out when they leave, or other visitors will not be allowed. One designated support person may remain overnight. The visitor must pass COVID-19 screenings, use hand sanitizer when entering and exiting the patients room and wear a mask at all times, including in the patients room. Patients must also wear a mask when staff or their visitor are in the room. Masking is required regardless of vaccination status.

## 2021-11-01 ENCOUNTER — Ambulatory Visit: Payer: Managed Care, Other (non HMO) | Admitting: Family Medicine

## 2021-11-03 ENCOUNTER — Other Ambulatory Visit: Payer: Self-pay | Admitting: Family Medicine

## 2021-11-03 DIAGNOSIS — E1129 Type 2 diabetes mellitus with other diabetic kidney complication: Secondary | ICD-10-CM

## 2021-11-04 ENCOUNTER — Ambulatory Visit: Payer: Managed Care, Other (non HMO)

## 2021-11-04 ENCOUNTER — Telehealth: Payer: Self-pay | Admitting: Urology

## 2021-11-04 ENCOUNTER — Encounter
Admission: RE | Disposition: A | Discharge status: Home / Self Care | Payer: Self-pay | Source: Home / Self Care | Attending: Urology

## 2021-11-04 ENCOUNTER — Ambulatory Visit
Admission: RE | Admit: 2021-11-04 | Discharge: 2021-11-04 | Disposition: A | Discharge status: Home / Self Care | Payer: Managed Care, Other (non HMO) | Attending: Urology | Admitting: Urology

## 2021-11-04 ENCOUNTER — Encounter: Payer: Self-pay | Admitting: Urology

## 2021-11-04 ENCOUNTER — Ambulatory Visit: Payer: Managed Care, Other (non HMO) | Admitting: Anesthesiology

## 2021-11-04 DIAGNOSIS — R000 Tachycardia, unspecified: Secondary | ICD-10-CM | POA: Insufficient documentation

## 2021-11-04 DIAGNOSIS — N329 Bladder disorder, unspecified: Secondary | ICD-10-CM

## 2021-11-04 DIAGNOSIS — R300 Dysuria: Secondary | ICD-10-CM | POA: Diagnosis not present

## 2021-11-04 DIAGNOSIS — R3129 Other microscopic hematuria: Secondary | ICD-10-CM | POA: Diagnosis present

## 2021-11-04 DIAGNOSIS — N3289 Other specified disorders of bladder: Secondary | ICD-10-CM | POA: Diagnosis not present

## 2021-11-04 DIAGNOSIS — N3021 Other chronic cystitis with hematuria: Secondary | ICD-10-CM | POA: Diagnosis not present

## 2021-11-04 DIAGNOSIS — N3281 Overactive bladder: Secondary | ICD-10-CM | POA: Diagnosis not present

## 2021-11-04 DIAGNOSIS — R011 Cardiac murmur, unspecified: Secondary | ICD-10-CM | POA: Diagnosis not present

## 2021-11-04 DIAGNOSIS — R310 Gross hematuria: Secondary | ICD-10-CM

## 2021-11-04 DIAGNOSIS — E119 Type 2 diabetes mellitus without complications: Secondary | ICD-10-CM | POA: Insufficient documentation

## 2021-11-04 DIAGNOSIS — I10 Essential (primary) hypertension: Secondary | ICD-10-CM | POA: Insufficient documentation

## 2021-11-04 DIAGNOSIS — R351 Nocturia: Secondary | ICD-10-CM | POA: Diagnosis not present

## 2021-11-04 DIAGNOSIS — N1330 Unspecified hydronephrosis: Secondary | ICD-10-CM | POA: Diagnosis not present

## 2021-11-04 HISTORY — PX: CYSTOSCOPY W/ RETROGRADES: SHX1426

## 2021-11-04 HISTORY — PX: CYSTOSCOPY WITH FULGERATION: SHX6638

## 2021-11-04 HISTORY — PX: URETEROSCOPY: SHX842

## 2021-11-04 HISTORY — PX: CYSTOSCOPY WITH BIOPSY: SHX5122

## 2021-11-04 LAB — GLUCOSE, CAPILLARY
Glucose-Capillary: 173 mg/dL — ABNORMAL HIGH (ref 70–99)
Glucose-Capillary: 193 mg/dL — ABNORMAL HIGH (ref 70–99)

## 2021-11-04 SURGERY — CYSTOSCOPY, WITH BIOPSY
Anesthesia: General | Laterality: Right

## 2021-11-04 MED ORDER — SODIUM CHLORIDE 0.9 % IV SOLN: INTRAVENOUS | Status: DC

## 2021-11-04 MED ORDER — HYDROCODONE-ACETAMINOPHEN 5-325 MG PO TABS: 1.0000 | ORAL_TABLET | Freq: Four times a day (QID) | ORAL | 0 refills | Status: DC | PRN

## 2021-11-04 MED ORDER — ROCURONIUM BROMIDE 10 MG/ML (PF) SYRINGE
PREFILLED_SYRINGE | INTRAVENOUS | Status: AC
  Filled 2021-11-04: qty 10

## 2021-11-04 MED ORDER — LIDOCAINE HCL (PF) 2 % IJ SOLN
INTRAMUSCULAR | Status: AC
  Filled 2021-11-04: qty 5

## 2021-11-04 MED ORDER — ONDANSETRON HCL 4 MG/2ML IJ SOLN: 4.0000 mg | Freq: Once | INTRAMUSCULAR | Status: DC | PRN

## 2021-11-04 MED ORDER — DEXAMETHASONE SODIUM PHOSPHATE 10 MG/ML IJ SOLN
INTRAMUSCULAR | Status: DC | PRN
  Administered 2021-11-04: 5 mg via INTRAVENOUS

## 2021-11-04 MED ORDER — LIDOCAINE HCL (CARDIAC) PF 100 MG/5ML IV SOSY
PREFILLED_SYRINGE | INTRAVENOUS | Status: DC | PRN
  Administered 2021-11-04: 40 mg via INTRAVENOUS
  Administered 2021-11-04: 60 mg via INTRAVENOUS

## 2021-11-04 MED ORDER — ORAL CARE MOUTH RINSE: 15.0000 mL | Freq: Once | OROMUCOSAL | Status: AC

## 2021-11-04 MED ORDER — CHLORHEXIDINE GLUCONATE 0.12 % MT SOLN
OROMUCOSAL | Status: AC
  Administered 2021-11-04: 15 mL via OROMUCOSAL
  Filled 2021-11-04: qty 15

## 2021-11-04 MED ORDER — CEFAZOLIN SODIUM-DEXTROSE 2-4 GM/100ML-% IV SOLN
INTRAVENOUS | Status: AC
  Filled 2021-11-04: qty 100

## 2021-11-04 MED ORDER — IOHEXOL 180 MG/ML  SOLN
INTRAMUSCULAR | Status: DC | PRN
  Administered 2021-11-04: 45 mL

## 2021-11-04 MED ORDER — HYDROMORPHONE HCL 1 MG/ML IJ SOLN
INTRAMUSCULAR | Status: DC | PRN
  Administered 2021-11-04: .5 mg via INTRAVENOUS

## 2021-11-04 MED ORDER — FENTANYL CITRATE (PF) 100 MCG/2ML IJ SOLN
INTRAMUSCULAR | Status: AC
  Filled 2021-11-04: qty 2

## 2021-11-04 MED ORDER — LACTATED RINGERS IV SOLN: INTRAVENOUS | Status: DC

## 2021-11-04 MED ORDER — OXYCODONE HCL 5 MG PO TABS
5.0000 mg | ORAL_TABLET | Freq: Once | ORAL | Status: AC | PRN
  Administered 2021-11-04: 5 mg via ORAL

## 2021-11-04 MED ORDER — MIDAZOLAM HCL 2 MG/2ML IJ SOLN
INTRAMUSCULAR | Status: DC | PRN
  Administered 2021-11-04: 2 mg via INTRAVENOUS

## 2021-11-04 MED ORDER — MIDAZOLAM HCL 2 MG/2ML IJ SOLN
INTRAMUSCULAR | Status: AC
  Filled 2021-11-04: qty 2

## 2021-11-04 MED ORDER — DEXAMETHASONE SODIUM PHOSPHATE 10 MG/ML IJ SOLN
INTRAMUSCULAR | Status: AC
  Filled 2021-11-04: qty 1

## 2021-11-04 MED ORDER — ONDANSETRON 4 MG PO TBDP: 4.0000 mg | ORAL_TABLET | Freq: Three times a day (TID) | ORAL | 0 refills | Status: DC | PRN

## 2021-11-04 MED ORDER — ONDANSETRON HCL 4 MG/2ML IJ SOLN
INTRAMUSCULAR | Status: DC | PRN
Start: 2021-11-04 — End: 2021-11-04
  Administered 2021-11-04: 4 mg via INTRAVENOUS

## 2021-11-04 MED ORDER — ACETAMINOPHEN 10 MG/ML IV SOLN: 1000.0000 mg | Freq: Once | INTRAVENOUS | Status: DC | PRN

## 2021-11-04 MED ORDER — FENTANYL CITRATE (PF) 100 MCG/2ML IJ SOLN: 25.0000 ug | INTRAMUSCULAR | Status: DC | PRN

## 2021-11-04 MED ORDER — CHLORHEXIDINE GLUCONATE 0.12 % MT SOLN: 15.0000 mL | Freq: Once | OROMUCOSAL | Status: AC

## 2021-11-04 MED ORDER — CEFAZOLIN SODIUM-DEXTROSE 2-4 GM/100ML-% IV SOLN
2.0000 g | INTRAVENOUS | Status: AC
  Administered 2021-11-04: 2 g via INTRAVENOUS

## 2021-11-04 MED ORDER — PROPOFOL 10 MG/ML IV BOLUS
INTRAVENOUS | Status: DC | PRN
  Administered 2021-11-04: 50 mg via INTRAVENOUS
  Administered 2021-11-04: 150 mg via INTRAVENOUS

## 2021-11-04 MED ORDER — FAMOTIDINE 20 MG PO TABS
ORAL_TABLET | ORAL | Status: AC
  Administered 2021-11-04: 20 mg via ORAL
  Filled 2021-11-04: qty 1

## 2021-11-04 MED ORDER — HYDROMORPHONE HCL 1 MG/ML IJ SOLN
INTRAMUSCULAR | Status: AC
  Filled 2021-11-04: qty 1

## 2021-11-04 MED ORDER — FENTANYL CITRATE (PF) 100 MCG/2ML IJ SOLN
INTRAMUSCULAR | Status: DC | PRN
Start: 2021-11-04 — End: 2021-11-04
  Administered 2021-11-04 (×2): 50 ug via INTRAVENOUS

## 2021-11-04 MED ORDER — SUGAMMADEX SODIUM 200 MG/2ML IV SOLN
INTRAVENOUS | Status: DC | PRN
  Administered 2021-11-04: 200 mg via INTRAVENOUS

## 2021-11-04 MED ORDER — ROCURONIUM BROMIDE 100 MG/10ML IV SOLN
INTRAVENOUS | Status: DC | PRN
Start: 2021-11-04 — End: 2021-11-04
  Administered 2021-11-04: 10 mg via INTRAVENOUS

## 2021-11-04 MED ORDER — ONDANSETRON HCL 4 MG/2ML IJ SOLN
INTRAMUSCULAR | Status: AC
  Filled 2021-11-04: qty 2

## 2021-11-04 MED ORDER — OXYCODONE HCL 5 MG PO TABS
ORAL_TABLET | ORAL | Status: AC
  Filled 2021-11-04: qty 1

## 2021-11-04 MED ORDER — OXYCODONE HCL 5 MG/5ML PO SOLN: 5.0000 mg | Freq: Once | ORAL | Status: AC | PRN

## 2021-11-04 MED ORDER — FAMOTIDINE 20 MG PO TABS: 20.0000 mg | ORAL_TABLET | Freq: Once | ORAL | Status: AC

## 2021-11-04 MED ORDER — SUCCINYLCHOLINE CHLORIDE 200 MG/10ML IV SOSY
PREFILLED_SYRINGE | INTRAVENOUS | Status: DC | PRN
Start: 2021-11-04 — End: 2021-11-04
  Administered 2021-11-04: 100 mg via INTRAVENOUS

## 2021-11-04 MED ORDER — SODIUM CHLORIDE 0.9 % IR SOLN
Status: DC | PRN
  Administered 2021-11-04: 1000 mL
  Administered 2021-11-04: 3000 mL

## 2021-11-04 MED ORDER — PROPOFOL 10 MG/ML IV BOLUS
INTRAVENOUS | Status: AC
  Filled 2021-11-04: qty 20

## 2021-11-04 SURGICAL SUPPLY — 39 items
BAG DRAIN CYSTO-URO LG1000N (MISCELLANEOUS) ×4 IMPLANT
BRUSH SCRUB EZ  4% CHG (MISCELLANEOUS)
BRUSH SCRUB EZ 1% IODOPHOR (MISCELLANEOUS) ×4 IMPLANT
BRUSH SCRUB EZ 4% CHG (MISCELLANEOUS) ×3 IMPLANT
CATH URET FLEX-TIP 2 LUMEN 10F (CATHETERS) ×4 IMPLANT
CATH URETL OPEN 5X70 (CATHETERS) ×4 IMPLANT
CNTNR SPEC 2.5X3XGRAD LEK (MISCELLANEOUS)
CONRAY 43 FOR UROLOGY 50M (MISCELLANEOUS) ×3 IMPLANT
CONT SPEC 4OZ STER OR WHT (MISCELLANEOUS)
CONT SPEC 4OZ STRL OR WHT (MISCELLANEOUS)
CONTAINER SPEC 2.5X3XGRAD LEK (MISCELLANEOUS) ×3 IMPLANT
DRAPE UTILITY 15X26 TOWEL STRL (DRAPES) ×4 IMPLANT
DRSG TELFA 4X3 1S NADH ST (GAUZE/BANDAGES/DRESSINGS) ×4 IMPLANT
ELECT REM PT RETURN 9FT ADLT (ELECTROSURGICAL) ×4
ELECTRODE REM PT RTRN 9FT ADLT (ELECTROSURGICAL) ×3 IMPLANT
GAUZE 4X4 16PLY ~~LOC~~+RFID DBL (SPONGE) ×8 IMPLANT
GLOVE SURG ENC MOIS LTX SZ6.5 (GLOVE) ×4 IMPLANT
GLOVE SURG ENC TEXT LTX SZ6.5 (GLOVE) ×4 IMPLANT
GOWN STRL REUS W/ TWL LRG LVL3 (GOWN DISPOSABLE) ×6 IMPLANT
GOWN STRL REUS W/TWL LRG LVL3 (GOWN DISPOSABLE) ×8
GUIDEWIRE GREEN .038 145CM (MISCELLANEOUS) ×3 IMPLANT
GUIDEWIRE STR DUAL SENSOR (WIRE) ×7 IMPLANT
INFUSOR MANOMETER BAG 3000ML (MISCELLANEOUS) ×4 IMPLANT
IV NS IRRIG 3000ML ARTHROMATIC (IV SOLUTION) ×3 IMPLANT
KIT TURNOVER CYSTO (KITS) ×4 IMPLANT
MANIFOLD NEPTUNE II (INSTRUMENTS) ×3 IMPLANT
NDL SAFETY ECLIPSE 18X1.5 (NEEDLE) ×3 IMPLANT
NEEDLE HYPO 18GX1.5 SHARP (NEEDLE) ×4
PACK CYSTO AR (MISCELLANEOUS) ×4 IMPLANT
SET CYSTO W/LG BORE CLAMP LF (SET/KITS/TRAYS/PACK) ×4 IMPLANT
SHEATH URETERAL 12FRX35CM (MISCELLANEOUS) ×4 IMPLANT
STENT URET 6FRX24 CONTOUR (STENTS) IMPLANT
STENT URET 6FRX26 CONTOUR (STENTS) IMPLANT
SURGILUBE 2OZ TUBE FLIPTOP (MISCELLANEOUS) ×4 IMPLANT
SYR TOOMEY IRRIG 70ML (MISCELLANEOUS) ×4
SYRINGE TOOMEY IRRIG 70ML (MISCELLANEOUS) ×3 IMPLANT
WATER STERILE IRR 1000ML POUR (IV SOLUTION) ×4 IMPLANT
WATER STERILE IRR 3000ML UROMA (IV SOLUTION) ×5 IMPLANT
WATER STERILE IRR 500ML POUR (IV SOLUTION) ×4 IMPLANT

## 2021-11-04 NOTE — Discharge Instructions (Addendum)

## 2021-11-04 NOTE — Op Note (Signed)
Date of procedure: 11/04/21  Preoperative diagnosis:  Microscopic hematuria Chronic dysuria Bladder lesion/ulceration Right hydronephrosis  Postoperative diagnosis:  Same as above Probable right UPJ obstruction  Procedure: Cystoscopy Bilateral retrograde pyelogram Diagnostic right ureteroscopy Bladder biopsy, targeted Fulguration  Surgeon: [name], MD  Anesthesia: General  Complications: None  Intraoperative findings: Patchy erythema of the left lateral bladder wall with area of ulceration seen on cystoscopy as well as some erythema on the right lateral bladder wall.  Moderate right hydronephrosis on retrograde pyelogram with an abrupt transition at the UPJ with somewhat high insertion fairly pathognomonic for UPJ obstruction.  Diagnostic ureteroscopy negative other than some mild narrowing at the UPJ with almost scarlike appearance.  Easily traversable with 7 [place] digital flexible scope.  Slightly sluggish drainage of contrast material on the side.  Targeted bladder biopsies to left and right lateral bladder walls including fulguration.  Diffuse hemorrhage / weeping of bladder mucosa noted with bladder distention.  EBL: Minimal  Specimens: Bladder biopsy times multiple  Drains: None  Indication: Virginia Weaver is a 71 y.o. patient with chronic hematuria and dysuria who previously underwent bladder biopsy in 2017 found to have inflammatory changes including with plasma cells.  She also noted to have new right hydronephrosis which is otherwise asymptomatic.  After reviewing the management options for treatment, she elected to proceed with the above surgical procedure(s). We have discussed the potential benefits and risks of the procedure, side effects of the proposed treatment, the likelihood of the patient achieving the goals of the procedure, and any potential problems that might occur during the procedure or recuperation. Informed consent has been  obtained.  Description of procedure:  The patient was taken to the operating room and general anesthesia was induced.  The patient was placed in the dorsal lithotomy position, prepped and draped in the usual sterile fashion, and preoperative antibiotics were administered. A preoperative time-out was performed.   41 French the scope was advanced per urethra into the bladder.  The bladder was inspected at which time the bladder appeared to be somewhat inflamed with nonspecific erythema on both the left and right bladder walls, left greater than right with an area of shallow ulceration on the left lateral bladder wall.  There were no papillary tumors or lesions.  As the bladder began to fill and distended, began to weep blood diffusely.  First attention was turned to the left ureteral orifice.  I was unable to cannulate using a 5 [place] open-ended ureteral catheter and thus used a wire and then the open-ended just within the UO.  Contrast was injected on the side which revealed no hydroureteronephrosis or filling defects.  Attention was then turned to the right side.  The same procedure was used to cannulate the distal ureter.  I injected contrast in the ureter itself appeared to be delicate however there was an abrupt transition right at the level of the UPJ with a somewhat high insertion and a dilated renal pelvis almost pathognomonic for UPJ obstruction.  There are some retained contrast in the renal pelvis with sluggish drainage.  I placed a wire up to the level of the kidney at this point in time.  I ended up advancing a 7 French flexible digital ureteroscope over the Super Stiff wire up to level the renal pelvis which went quite easily.  The wire was withdrawn.  Formal pyeloscopy was then performed and each every calyx was visualized.  The renal pelvis was noted to be distended along with some  caliectasis.  Upon backing the scope to the level of the UPJ, there was some mild narrowing noted here however  again I was able to traverse it quite easily with the scope.  There is no erythema or any other concerning changes for malignancy at this level.  There was a somewhat whitish appearance at the level of the narrowing which had an almost scarlike appearance.  Then backed the scope down the length the ureter inspecting along the way.  Overall, the ureteroscopy was very atraumatic.  I elected not to leave a stent given the chronicity of the hydronephrosis as well my concern for anticipated very poor tolerance of the stent based on the degree of bladder irritation.  Next, cold cup biopsy forceps were used to biopsy several areas on the left lateral bladder wall including adjacent and involving the area of ulceration as well as on the right lateral bladder wall where there was erythema.  I used Bugbee electrocautery to fulgurate these areas diffusely.  Finally, I was able to achieve reasonable hemostasis although there was some nonspecific bleeding.  Ultimately, was able to clear her urine to a very light pink.  The bladder was drained.  The patient was then cleaned and dried, repositioned in supine position, reversed from anesthesia, taken to the PACU in stable condition  Plan: We will plan to have her come back in about 2 weeks to review her pathology results which I anticipate will be benign and consistent with her previous.  We will discuss management of her right what appears to be UPJ obstruction which will likely include Lasix renogram and plans for further intervention thereafter which may include observation depending on the degree of obstruction.  [name], M.D.

## 2021-11-04 NOTE — Anesthesia Preprocedure Evaluation (Addendum)
Anesthesia Evaluation  Patient identified by MRN, date of birth, ID band Patient awake    Reviewed: Allergy & Precautions, NPO status , Patient's Chart, lab work & pertinent test results  History of Anesthesia Complications Negative for: history of anesthetic complications  Airway Mallampati: I   Neck ROM: Full    Dental no notable dental hx.    Pulmonary  COVID+ 08/2021; RSV 09/2021   Pulmonary exam normal breath sounds clear to auscultation       Cardiovascular hypertension,  Rhythm:Regular Rate:Normal + Systolic murmurs Murmur   ECG 10/05/21:  Sinus tachycardia  Otherwise normal ECG    Neuro/Psych PSYCHIATRIC DISORDERS Anxiety negative neurological ROS     GI/Hepatic GERD  ,  Endo/Other  diabetes, Type 2Obesity   Renal/GU negative Renal ROS     Musculoskeletal   Abdominal   Peds  Hematology  (+) Blood dyscrasia, anemia ,   Anesthesia Other Findings   Reproductive/Obstetrics                            Anesthesia Physical Anesthesia Plan  ASA: 3  Anesthesia Plan: General   Post-op Pain Management:    Induction: Intravenous  PONV Risk Score and Plan: 3 and Ondansetron, Dexamethasone and Treatment may vary due to age or medical condition  Airway Management Planned: Oral ETT  Additional Equipment:   Intra-op Plan:   Post-operative Plan: Extubation in OR  Informed Consent: I have reviewed the patients History and Physical, chart, labs and discussed the procedure including the risks, benefits and alternatives for the proposed anesthesia with the patient or authorized representative who has indicated his/her understanding and acceptance.     Dental advisory given  Plan Discussed with: CRNA  Anesthesia Plan Comments: (Patient consented for risks of anesthesia including but not limited to:  - adverse reactions to medications - damage to eyes, teeth, lips or other oral  mucosa - nerve damage due to positioning  - sore throat or hoarseness - damage to heart, brain, nerves, lungs, other parts of body or loss of life  Informed patient about role of CRNA in peri- and intra-operative care.  Patient voiced understanding.)        Anesthesia Quick Evaluation

## 2021-11-04 NOTE — Telephone Encounter (Signed)
Pt had surgery today with Dr. Erlene Quan and wants to know if something can be called in for her nausea. Her callback number is [phone] Horris Latino).

## 2021-11-04 NOTE — Transfer of Care (Signed)
Immediate Anesthesia Transfer of Care Note  Patient: Virginia Weaver  Procedure(s) Performed: CYSTOSCOPY WITH BLADDER  BIOPSY AND POSSIBLE URETERAL BIOPSY CYSTOSCOPY WITH FULGERATION CYSTOSCOPY WITH RETROGRADE PYELOGRAM (Bilateral) DIAGNOSTIC URETEROSCOPY-POSSIBLE CYSTOSCOPY WITH STENT PLACEMENT-POSSIBLE (Right)  Patient Location: PACU  Anesthesia Type:General  Level of Consciousness: awake and drowsy  Airway & Oxygen Therapy: Patient Spontanous Breathing and Patient connected to face mask  Post-op Assessment: Report given to RN and Post -op Vital signs reviewed and stable  Post vital signs: Reviewed and stable  Last Vitals:  Vitals Value Taken Time  BP 147/77 11/04/21 0947  Temp 35.9 C 11/04/21 0945  Pulse 85 11/04/21 0947  Resp 15 11/04/21 0947  SpO2 99 % 11/04/21 0947  Vitals shown include unvalidated device data.  Last Pain:  Vitals:   11/04/21 0945  TempSrc:   PainSc: Asleep         Complications: No notable events documented.

## 2021-11-04 NOTE — Telephone Encounter (Signed)
As per dr Erlene Quan OK to call in zofran. Sw daughter [place] on [place]. [facility] to [facility]. Daughter verbalized understanding.

## 2021-11-04 NOTE — Anesthesia Postprocedure Evaluation (Signed)
Anesthesia Post Note  Patient: Virginia Weaver  Procedure(s) Performed: CYSTOSCOPY WITH BLADDER  BIOPSY CYSTOSCOPY WITH FULGERATION CYSTOSCOPY WITH RETROGRADE PYELOGRAM (Bilateral) DIAGNOSTIC URETEROSCOPY (Right)  Patient location during evaluation: PACU Anesthesia Type: General Level of consciousness: awake and alert, oriented and patient cooperative Pain management: pain level controlled Vital Signs Assessment: post-procedure vital signs reviewed and stable Respiratory status: spontaneous breathing, nonlabored ventilation and respiratory function stable Cardiovascular status: blood pressure returned to baseline and stable Postop Assessment: adequate PO intake Anesthetic complications: no   No notable events documented.   Last Vitals:  Vitals:   11/04/21 1035 11/04/21 1042  BP: (!) 184/74 (!) 156/72  Pulse: 72   Resp: 18   Temp: (!) 36.3 C   SpO2: 100%     Last Pain:  Vitals:   11/04/21 1035  TempSrc: Tympanic  PainSc: 0-No pain                 Darrin Nipper

## 2021-11-04 NOTE — Anesthesia Procedure Notes (Signed)
Procedure Name: Intubation Date/Time: 11/04/2021 9:03 AM Performed by: Loletha Grayer, CRNA Pre-anesthesia Checklist: Patient identified, Patient being monitored, Timeout performed, Emergency Drugs available and Suction available Patient Re-evaluated:Patient Re-evaluated prior to induction Oxygen Delivery Method: Circle system utilized Preoxygenation: Pre-oxygenation with 100% oxygen Induction Type: IV induction Ventilation: Mask ventilation without difficulty Laryngoscope Size: 3 and Mac Grade View: Grade II Tube type: Oral Tube size: 7.0 mm Number of attempts: 1 Airway Equipment and Method: Stylet Placement Confirmation: ETT inserted through vocal cords under direct vision, positive ETCO2 and breath sounds checked- equal and bilateral Secured at: 20 cm Tube secured with: Tape Dental Injury: Teeth and Oropharynx as per pre-operative assessment

## 2021-11-04 NOTE — H&P (Signed)
11/04/21 Up to date, no changes RRR CTAB   CC:     Chief Complaint  Patient presents with   Cysto      HPI: Virginia Weaver is a 71 y.o.female with a personal history of OAB, nocturia, and microscopic hematuria, who presents today for a cystoscopy.    She is s/p negative bladder biopsy in 2017 that was consistent with chronic cystitis with lymphoid aggregates and mixed inflammatory infiltrates including eosinophils and plasma cells.   She is doing well today.         Vitals:    09/29/21 1616  BP: (!) 170/80  Pulse: 81  NED. A&Ox3.   No respiratory distress   Abd soft, NT, ND Normal external genitalia with patent urethral meatus  Past Medical History:  Diagnosis Date   Acid reflux 04/02/2001   Adiposity 04/20/1993   Anemia    h/o   Arthritis    Benign essential HTN 11/11/1993   Carotid atherosclerosis, bilateral    COVID-19 08/2021   Diabetes mellitus without complication (HCC)    diet    Heart murmur    Hypertension    RSV (respiratory syncytial virus infection) 09/2021   Syncope and collapse 01/20/2009   Tinnitus    left ear   Urticaria 06/04/2015   Past Surgical History:  Procedure Laterality Date   CYSTOSCOPY WITH BIOPSY N/A 12/22/2015   Procedure: CYSTOSCOPY WITH BIOPSY;  Surgeon: [name], MD;  Location: [facility] ORS;  Service: Urology;  Laterality: N/A;   TUBAL LIGATION  1987   TUMMY TUCK  06/17/2010   Scheduled Meds:  chlorhexidine  15 mL Mouth/Throat Once   Or   mouth rinse  15 mL Mouth Rinse Once   chlorhexidine       famotidine       famotidine  20 mg Oral Once   Continuous Infusions:  sodium chloride     ceFAZolin      ceFAZolin (ANCEF) IV     PRN Meds:. Allergies  Allergen Reactions   Ibuprofen     Other reaction(s): Abdominal Pain Can't take NSAIDS secondary to erosions due to NSAIDS.   Nsaids     Can't take NSAIDS secondary to erosions due to NSAIDS.       Cystoscopy Procedure Note   Patient identification was  confirmed, informed consent was obtained, and patient was prepped using Betadine solution.  Lidocaine jelly was administered per urethral meatus.     Procedure: - Flexible cystoscope introduced, without any difficulty.   - Thorough search of the bladder revealed:    normal urethral meatus    no stones    no ulcers     no tumors    no urethral polyps    no trabeculation     Left interior lateral bladder wall has erythema, inflammation, and a small area of ulceration. There is some bleeding of this area with manipulation of the scope, no evert papillary tumor in the bladder   - Ureteral orifices were normal in position and appearance.   Post-Procedure: - Patient tolerated the procedure well   Assessment/ Plan:   Chronic cystitis - bladder appears to have stable chronic cystitis which she had previously with negative biopsy - Given slight ulceration and bleeding of this area may be consistent with Hunters ulcers which she might benefit from repeat biopsy and fulguration of these.  - Urine sent for cytology and culture    2. Right hydronephrosis  - Mild with subtle transition  - Not  previously appreciated  - Recommend she undergo retrograde and diagnostic ureteroscopy in the OR to rule out underlying pathology. Discussed with her that she may require a stent post-op      I,Kailey Littlejohn,acting as a scribe for [name], MD.,have documented all relevant documentation on the behalf of [name], MD,as directed by  [name], MD while in the presence of [name], MD.   I have reviewed the above documentation for accuracy and completeness, and I agree with the above.    [name], MD

## 2021-11-05 ENCOUNTER — Telehealth: Payer: Self-pay | Admitting: *Deleted

## 2021-11-05 LAB — SURGICAL PATHOLOGY

## 2021-11-05 NOTE — Telephone Encounter (Addendum)
Patient informed, voiced understanding.  ----- Message from [name], MD sent at 11/05/2021 12:30 PM EST ----- Please let this patient know that her biopsy was benign.  We will discuss the result in detail at her follow-up.  [name], MD

## 2021-11-09 ENCOUNTER — Other Ambulatory Visit: Payer: Self-pay

## 2021-11-09 ENCOUNTER — Ambulatory Visit: Payer: Managed Care, Other (non HMO) | Admitting: Dermatology

## 2021-11-09 DIAGNOSIS — L649 Androgenic alopecia, unspecified: Secondary | ICD-10-CM | POA: Diagnosis not present

## 2021-11-09 DIAGNOSIS — L570 Actinic keratosis: Secondary | ICD-10-CM

## 2021-11-09 DIAGNOSIS — L409 Psoriasis, unspecified: Secondary | ICD-10-CM | POA: Diagnosis not present

## 2021-11-09 DIAGNOSIS — L650 Telogen effluvium: Secondary | ICD-10-CM

## 2021-11-09 DIAGNOSIS — R202 Paresthesia of skin: Secondary | ICD-10-CM | POA: Diagnosis not present

## 2021-11-09 DIAGNOSIS — L659 Nonscarring hair loss, unspecified: Secondary | ICD-10-CM

## 2021-11-09 MED ORDER — FLUOROURACIL 5 % EX CREA: TOPICAL_CREAM | CUTANEOUS | 0 refills | Status: AC

## 2021-11-09 MED ORDER — VTAMA 1 % EX CREA: 1.0000 "application " | TOPICAL_CREAM | Freq: Every day | CUTANEOUS | 11 refills | Status: AC

## 2021-11-09 NOTE — Patient Instructions (Addendum)
Recommend minoxidil 5% (Rogaine for men) solution or foam to be applied to the scalp and left in. This should ideally be used twice daily for best results but it helps with hair regrowth when used at least three times per week. Rogaine initially can cause increased hair shedding for the first few weeks but this will stop with continued use. In studies, people who used minoxidil (Rogaine) for at least 6 months had thicker hair than people who did not. Minoxidil topical (Rogaine) only works as long as it continues to be used. If if it is no longer used then the hair it has been helping to regrow can fall out. Minoxidil topical (Rogaine) can cause increased facial hair growth which can usually be managed easily with a battery-operated hair trimmer. If facial hair growth is bothersome, switching to the 2% women's version can decrease the risk of unwanted facial hair growth.    If You Need Anything After Your Visit  If you have any questions or concerns for your doctor, please call our main line at [phone] and press option 4 to reach your doctor's medical assistant. If no one answers, please leave a voicemail as directed and we will return your call as soon as possible. Messages left after 4 pm will be answered the following business day.   You may also send [place] a message via [place]. We typically respond to MyChart messages within 1-2 business days.  For prescription refills, please ask your pharmacy to contact our office. Our fax number is [phone].  If you have an urgent issue when the clinic is closed that cannot wait until the next business day, you can page your doctor at the number below.    Please note that while we do our best to be available for urgent issues outside of office hours, we are not available 24/7.   If you have an urgent issue and are unable to reach [place], you may choose to seek medical care at your doctor's office, retail clinic, urgent care center, or emergency room.  If  you have a medical emergency, please immediately call 911 or go to the emergency department.  Pager Numbers  - Dr. Nehemiah Massed: [phone]  - Dr. Laurence Ferrari: [phone]  - Dr. Nicole Kindred: [phone]  In the event of inclement weather, please call our main line at [phone] for an update on the status of any delays or closures.  Dermatology Medication Tips: Please keep the boxes that topical medications come in in order to help keep track of the instructions about where and how to use these. Pharmacies typically print the medication instructions only on the boxes and not directly on the medication tubes.   If your medication is too expensive, please contact our office at [phone] option 4 or send [place] a message through [place].   We are unable to tell what your co-pay for medications will be in advance as this is different depending on your insurance coverage. However, we may be able to find a substitute medication at lower cost or fill out paperwork to get insurance to cover a needed medication.   If a prior authorization is required to get your medication covered by your insurance company, please allow [place] 1-2 business days to complete this process.  Drug prices often vary depending on where the prescription is filled and some pharmacies may offer cheaper prices.  The website www.goodrx.com contains coupons for medications through different pharmacies. The prices here do not account for what the cost may be with  help from insurance (it may be cheaper with your insurance), but the website can give you the price if you did not use any insurance.  - You can print the associated coupon and take it with your prescription to the pharmacy.  - You may also stop by our office during regular business hours and pick up a GoodRx coupon card.  - If you need your prescription sent electronically to a different pharmacy, notify our office through [facility] or by phone at [phone] option  4.     Si Usted Necesita Algo Despus de Su Visita  Tambin puede enviarnos un mensaje a travs de Pharmacist, community. Por lo general respondemos a los mensajes de MyChart en el transcurso de 1 a 2 das hbiles.  Para renovar recetas, por favor pida a su farmacia que se ponga en contacto con nuestra oficina. Harland Dingwall de fax es [place] [phone].  Si tiene un asunto urgente cuando la clnica est cerrada y que no puede esperar hasta el siguiente da hbil, puede llamar/localizar a su doctor(a) al nmero que aparece a continuacin.   Por favor, tenga en cuenta que aunque hacemos todo lo posible para estar disponibles para asuntos urgentes fuera del horario de [place], no estamos disponibles las 24 horas del da, los 7 das de la [place].   Si tiene un problema urgente y no puede comunicarse con nosotros, puede optar por buscar atencin mdica  en el consultorio de su doctor(a), en una clnica privada, en un centro de atencin urgente o en una sala de emergencias.  Si tiene Engineering geologist, por favor llame inmediatamente al 911 o vaya a la sala de emergencias.  Nmeros de bper  - Dr. Nehemiah Massed: [phone]  - Dra. Moye: [phone]  - Dra. Nicole Kindred: [phone]  En caso de inclemencias del [place], por favor llame a Johnsie Kindred principal al [phone] para una actualizacin sobre el [place] de cualquier retraso o cierre.  Consejos para la medicacin en dermatologa: Por favor, guarde las cajas en las que vienen los medicamentos de uso tpico para ayudarle a seguir las instrucciones sobre dnde y cmo usarlos. Las farmacias generalmente imprimen las instrucciones del medicamento slo en las cajas y no directamente en los tubos del [place].   Si su medicamento es muy caro, por favor, pngase en contacto con Zigmund Daniel llamando al [phone] y presione la opcin 4 o envenos un mensaje a travs de Pharmacist, community.   No podemos decirle cul ser su copago por los medicamentos por  adelantado ya que esto es diferente dependiendo de la cobertura de su seguro. Sin embargo, es posible que podamos encontrar un medicamento sustituto a Electrical engineer un formulario para que el seguro cubra el medicamento que se considera necesario.   Si se requiere una autorizacin previa para que su compaa de seguros [place] su medicamento, por favor permtanos de 1 a 2 das hbiles para completar este proceso.  Los precios de los medicamentos varan con frecuencia dependiendo del Environmental consultant de dnde se surte la receta y alguna farmacias pueden ofrecer precios ms baratos.  El sitio web www.goodrx.com tiene cupones para medicamentos de Airline pilot. Los precios aqu no tienen en cuenta lo que podra costar con la ayuda del seguro (puede ser ms barato con su seguro), pero el sitio web puede darle el precio si no utiliz Research scientist (physical sciences).  - Puede imprimir el cupn correspondiente y llevarlo con su receta a la farmacia.  - Tambin puede pasar por nuestra oficina durante el  horario de Freight forwarder regular y Charity fundraiser una tarjeta de cupones de GoodRx.  - Si necesita que su receta se enve electrnicamente a una farmacia diferente, informe a nuestra oficina a travs de MyChart de [place] o por telfono llamando al [phone] y presione la opcin 4.

## 2021-11-09 NOTE — Progress Notes (Signed)
New Patient Visit  Subjective  Virginia Weaver is a 71 y.o. female who presents for the following: Psoriasis (Pt states that she has active psoriasis all over her upper body. Treating with [facility] cream from PCP. States that she does not use it as often as prescribed. Pt complains of joint pain and stiffness in the mornings for approx 1 hour. ).  Objective  Well appearing patient in no apparent distress; mood and affect are within normal limits.  A focused examination was performed including arms, chest, back, buttocks, scalp, face. Relevant physical exam findings are noted in the Assessment and Plan.  arms, inframammary, left buttock, scalp Well-demarcated erythematous papules/plaques with silvery scale, guttate pink scaly papules.   Right Upper Back Hyperpigmented patch  Nose Erythematous thin papules/macules with gritty scale.   Scalp Diffuse thinning of hair, positive hair pull test.    Assessment & Plan  Psoriasis arms, inframammary, left buttock, scalp  Chronic condition with duration or expected duration over one year. Condition is bothersome to patient. Currently flared.  Psoriasis is a chronic non-curable, but treatable genetic/hereditary disease that may have other systemic features affecting other organ systems such as joints (Psoriatic Arthritis). It is associated with an increased risk of inflammatory bowel disease, heart disease, non-alcoholic fatty liver disease, and depression.    Patient with joint pain and stiffness.  Recommend seeing rheumatology. Will refer to [facility]. Apply once a day daily to affected areas.   Tapinarof (VTAMA) 1 % CREA - arms, inframammary, left buttock, scalp Apply 1 application topically daily.  Related Procedures Ambulatory referral to Rheumatology  Related Medications triamcinolone cream (KENALOG) 0.1 % USE TOPICALLY TWICE DAILY AS NEEDED  Notalgia paresthetica Right Upper Back  Recommend OTC Gold  Bond Rapid Relief Anti-Itch cream (pramoxine + menthol), CeraVe Anti-itch cream or lotion (pramoxine), Sarna lotion (Original- menthol + camphor or Sensitive- pramoxine) or Eucerin 12 hour Itch Relief lotion (menthol) up to 3 times per day to areas on body that are itchy.  Notalgia paresthetica is a chronic condition affecting the skin of the back in which a pinched nerve along the spine causes itching or changes in sensation in an area of skin. This is usually accompanied by chronic rubbing or scratching. There is no cure, but there are some treatments which may help control the itch.   Over the counter (non-prescription) treatments for notalgia paresthetica include numbing creams like pramoxine or lidocaine which temporarily reduce itch or Capsaicin-containing creams which cause a burning sensation but which sometimes over time will reset the nerves to stop producing itch.   If you choose to use Capsaicin cream, it is recommended to use it 5 times daily for 1 week followed by 3 times daily for 3-6 weeks. You may have to continue using it long-term. For severe cases, there are some prescription cream or pill options which may help.   AK (actinic keratosis) Nose  Actinic keratoses are precancerous spots that appear secondary to cumulative UV radiation exposure/sun exposure over time. They are chronic with expected duration over 1 year. A portion of actinic keratoses will progress to squamous cell carcinoma of the skin. It is not possible to reliably predict which spots will progress to skin cancer and so treatment is recommended to prevent development of skin cancer.  Recommend daily broad spectrum sunscreen SPF 30+ to sun-exposed areas, reapply every 2 hours as needed.  Recommend staying in the shade or wearing long sleeves, sun glasses (UVA+UVB protection) and wide brim  hats (4-inch brim around the entire circumference of the hat). Call for new or changing lesions.   Discussed treatment options  being Ln2 in office vs 38fu/calcipotriene cream.  Pt wants to do cream.   Start 5-fluorouracil/calcipotriene cream twice a day for 4 days to affected areas including nose. Prescription sent to [facility]. Patient provided with contact information for pharmacy and advised the pharmacy will mail the prescription to their home. Patient provided with handout reviewing treatment course and side effects and advised to call or message [place] on MyChart with any concerns.    fluorouracil (EFUDEX) 5 % cream - Nose Apply to affected area on nose twice a day x 4 days  Androgenetic alopecia Scalp  With telogen affluvium  Positive hair pull test. Pt recently had COVID. Will monitor for resolution of telogen effluvium  Recommend minoxidil 5% (Rogaine for men) solution or foam to be applied to the scalp and left in. This should ideally be used twice daily for best results but it helps with hair regrowth when used at least three times per week. Rogaine initially can cause increased hair shedding for the first few weeks but this will stop with continued use. In studies, people who used minoxidil (Rogaine) for at least 6 months had thicker hair than people who did not. Minoxidil topical (Rogaine) only works as long as it continues to be used. If if it is no longer used then the hair it has been helping to regrow can fall out. Minoxidil topical (Rogaine) can cause increased facial hair growth which can usually be managed easily with a battery-operated hair trimmer. If facial hair growth is bothersome, switching to the 2% women's version can decrease the risk of unwanted facial hair growth.  Will send clobetasol solution to use daily prn for psoriasis if minoxidil becomes irritating to scalp   Return in about 3 months (around 02/07/2022) for alopecia, AK, psoriasis f/u.   I, Harriett Sine, CMA, am acting as scribe for Forest Gleason, MD.  Documentation: I have reviewed the above documentation for accuracy and  completeness, and I agree with the above.  Forest Gleason, MD

## 2021-11-10 ENCOUNTER — Encounter: Payer: Self-pay | Admitting: Dermatology

## 2021-11-18 ENCOUNTER — Ambulatory Visit
Admission: RE | Admit: 2021-11-18 | Discharge: 2021-11-18 | Disposition: A | Discharge status: Home / Self Care | Payer: Managed Care, Other (non HMO) | Source: Ambulatory Visit | Attending: Family Medicine | Admitting: Family Medicine

## 2021-11-18 ENCOUNTER — Other Ambulatory Visit: Payer: Self-pay

## 2021-11-18 DIAGNOSIS — Z1231 Encounter for screening mammogram for malignant neoplasm of breast: Secondary | ICD-10-CM | POA: Insufficient documentation

## 2021-11-27 ENCOUNTER — Other Ambulatory Visit: Payer: Self-pay | Admitting: Family Medicine

## 2021-11-27 DIAGNOSIS — F411 Generalized anxiety disorder: Secondary | ICD-10-CM

## 2021-11-29 ENCOUNTER — Other Ambulatory Visit: Payer: Self-pay | Admitting: Family Medicine

## 2021-11-29 DIAGNOSIS — R809 Proteinuria, unspecified: Secondary | ICD-10-CM

## 2021-11-29 DIAGNOSIS — E1129 Type 2 diabetes mellitus with other diabetic kidney complication: Secondary | ICD-10-CM

## 2021-12-06 NOTE — Progress Notes (Signed)
12/07/21 8:51 AM   [name] 1951/06/21 [id number]  Referring provider:  [name], PA-C [address] [place],  [place] [address] Chief Complaint  Patient presents with   Hematuria     HPI: Virginia Weaver is a 71 y.o.female with a personal history of OAB, chronic cystitis, right hydronephrosis nocturia, and microscopic hematuria, who presents today for a 1 mont post-op follow-up.  She is s/p negative bladder biopsy in 2017 that was consistent with chronic cystitis with lymphoid aggregates and mixed inflammatory infiltrates including eosinophils and plasma cells.  She underwent a cystoscopy on 09/29/2021 that showed  left interior lateral bladder wall erythema, inflammation, and a small area of ulceration.Some bleeding of area with manipulation of the scope, no evert papillary tumor in the bladder  CT hematuria workup on 10/12/2021 visualized mild right hydronephrosis with abrupt transition from distended right renal pelvis to nondistended proximal right ureter. No stone at this location no soft tissue mass lesion evident. While imaging features suggest component of UPJ obstruction, no right-sided hydronephrosis.   She is s/p cystoscopy with bilateral retrograde pyelogram, diagnostic right ureteroscopy, targeted bladder biopsy and fulguration on 11/04/2021. Intraoperative findings showed patchy erythema of the left lateral bladder wall with area of ulceration seen on cystoscopy as well as some erythema on the right lateral bladder wall.  Moderate right hydronephrosis on retrograde pyelogram with an abrupt transition at the UPJ with somewhat high insertion fairly pathognomonic for UPJ obstruction.  Diagnostic ureteroscopy negative other than some mild narrowing at the UPJ with almost scarlike appearance.  Easily traversable with 7 [place] digital flexible scope.  Slightly sluggish drainage of contrast material on the side.  Targeted bladder biopsies to left  and right lateral bladder walls including fulguration.  Diffuse hemorrhage / weeping of bladder mucosa noted with bladder distention.  Surgical pathology was consistent with chronic cystitis with marked superficial lymphoplasmacytic infiltrates with rare eosinophils, and patchy lymphoid aggregates. Acute superficial hemorrhage and no atypia or malignancy.   She reports today that she has seen a difference. She has been sleeping better at night.  Her bladder symptoms have improved dramatically since fulguration as outlined above.  She denies any right flank pain, completely asymptomatic.  PMH: Past Medical History:  Diagnosis Date   Acid reflux 04/02/2001   Adiposity 04/20/1993   Anemia    h/o   Arthritis    Benign essential HTN 11/11/1993   Carotid atherosclerosis, bilateral    COVID-19 08/2021   Diabetes mellitus without complication (HCC)    diet    Heart murmur    Hypertension    RSV (respiratory syncytial virus infection) 09/2021   Syncope and collapse 01/20/2009   Tinnitus    left ear   Urticaria 06/04/2015    Surgical History: Past Surgical History:  Procedure Laterality Date   CYSTOSCOPY W/ RETROGRADES Bilateral 11/04/2021   Procedure: CYSTOSCOPY WITH RETROGRADE PYELOGRAM;  Surgeon: [name], MD;  Location: [facility];  Service: Urology;  Laterality: Bilateral;   CYSTOSCOPY WITH BIOPSY N/A 12/22/2015   Procedure: CYSTOSCOPY WITH BIOPSY;  Surgeon: [name], MD;  Location: [facility];  Service: Urology;  Laterality: N/A;   CYSTOSCOPY WITH BIOPSY N/A 11/04/2021   Procedure: CYSTOSCOPY WITH BLADDER  BIOPSY;  Surgeon: [name], MD;  Location: [facility];  Service: Urology;  Laterality: N/A;   CYSTOSCOPY WITH FULGERATION N/A 11/04/2021   Procedure: CYSTOSCOPY WITH FULGERATION;  Surgeon: [name], MD;  Location: [facility];  Service: Urology;  Laterality: N/A;  TUBAL LIGATION  1987   TUMMY TUCK  06/17/2010   URETEROSCOPY Right 11/04/2021   Procedure:  DIAGNOSTIC URETEROSCOPY;  Surgeon: [name], MD;  Location: [facility];  Service: Urology;  Laterality: Right;    Home Medications:  Allergies as of 12/07/2021       Reactions   Ibuprofen    Other reaction(s): Abdominal Pain Can't take NSAIDS secondary to erosions due to NSAIDS.   Nsaids    Can't take NSAIDS secondary to erosions due to NSAIDS.        Medication List        Accurate as of December 07, 2021  8:51 AM. If you have any questions, ask your nurse or doctor.          STOP taking these medications    mirabegron ER 50 MG Tb24 tablet Commonly known as: MYRBETRIQ Stopped by: [name], MD       TAKE these medications    ALPRAZolam 0.5 MG tablet Commonly known as: XANAX Take 1 tablet (0.5 mg total) by mouth daily as needed for up to 45 doses for anxiety (may repeat dose if needed; max of 15 times/month). What changed: when to take this   calcium carbonate 600 MG Tabs tablet Commonly known as: OS-CAL Take 1 tablet by mouth 2 (two) times daily with a meal.   Cholecalciferol 25 MCG (1000 UT) capsule Take 1,000 Units by mouth daily.   Cinnamon 500 MG capsule Take 1,000 mg by mouth 2 (two) times daily.   CoQ10 200 MG Caps Take 1 capsule by mouth daily.   docusate sodium 100 MG capsule Commonly known as: COLACE Take 100 mg by mouth daily.   ESTER C PO Take 1,000 mg by mouth daily.   Fish Oil 500 MG Caps Take 500 mg by mouth daily.   fluorouracil 5 % cream Commonly known as: EFUDEX Apply to affected area on nose twice a day x 4 days   gabapentin 100 MG capsule Commonly known as: NEURONTIN TAKE 1 CAPSULE EVERY MORNING  AND TAKE 2 CAPSULES AT BEDTIME   Ginkgo Biloba 50 MG Caps Take 50 mg by mouth daily.   Hair/Skin/Nails Tabs Take 1 tablet by mouth in the morning and at bedtime.   HYDROcodone-acetaminophen 5-325 MG tablet Commonly known as: NORCO/VICODIN Take 1-2 tablets by mouth every 6 (six) hours as needed for moderate pain.    hydrocortisone 25 MG suppository Commonly known as: ANUSOL-HC UNWRAP AND INSERT 1 SUPPOSITORY RECALLY TWICE A DAY   levocetirizine 5 MG tablet Commonly known as: XYZAL TAKE 1 TABLET BY MOUTH DAILY What changed:  how much to take how to take this when to take this   metFORMIN 500 MG tablet Commonly known as: GLUCOPHAGE TAKE ONE TABLET BY MOUTH TWICE DAILY WITH A MEAL   montelukast 10 MG tablet Commonly known as: SINGULAIR TAKE 1 TABLET BY MOUTH DAILY   ondansetron 4 MG disintegrating tablet Commonly known as: ZOFRAN-ODT Take 1 tablet (4 mg total) by mouth every 8 (eight) hours as needed for nausea or vomiting.   pravastatin 40 MG tablet Commonly known as: PRAVACHOL Take 40 mg by mouth at bedtime.   PROBIOTIC ACIDOPHILUS PO Take 1 capsule by mouth daily.   ramipril 5 MG capsule Commonly known as: ALTACE Take 5 mg by mouth at bedtime.   SUPER B COMPLEX & C Tabs Take 1 tablet by mouth daily.   triamcinolone cream 0.1 % Commonly known as: KENALOG USE TOPICALLY TWICE DAILY AS NEEDED  Vtama 1 % Crea Generic drug: Tapinarof Apply 1 application topically daily.        Allergies:  Allergies  Allergen Reactions   Ibuprofen     Other reaction(s): Abdominal Pain Can't take NSAIDS secondary to erosions due to NSAIDS.   Nsaids     Can't take NSAIDS secondary to erosions due to NSAIDS.    Family History: Family History  Problem Relation Age of Onset   Heart attack Mother    Stroke Mother    Diabetes Mother    Throat cancer Mother    Heart attack Father    Hypertension Father    Glaucoma Father    AAA (abdominal aortic aneurysm) Father    Obesity Sister    Throat cancer Maternal Grandmother    Heart attack Maternal Grandfather    CVA Paternal Grandmother    Congestive Heart Failure Paternal Grandmother    Heart attack Paternal Grandfather    Breast cancer Cousin     Social History:  reports that she has never smoked. She has never used smokeless  tobacco. She reports that she does not drink alcohol and does not use drugs.   Physical Exam: BP (!) 166/82    Pulse 80    Ht 5\' 4"  (1.626 m)    Wt 193 lb (87.5 kg)    BMI 33.13 kg/m   Constitutional:  Alert and oriented, No acute distress. HEENT: [place] AT, moist mucus membranes.  Trachea midline, no masses. Cardiovascular: No clubbing, cyanosis, or edema. Respiratory: Normal respiratory effort, no increased work of breathing. Skin: No rashes, bruises or suspicious lesions. Neurologic: Grossly intact, no focal deficits, moving all 4 extremities. Psychiatric: Normal mood and affect.  Laboratory Data: Lab Results  Component Value Date   CREATININE 0.80 09/24/2021   Lab Results  Component Value Date   HGBA1C 5.9 (A) 04/19/2021    Urinalysis   Pertinent Imaging:    Assessment & Plan:    Chronic cystitis  - Inflammatory x2 biopsies with negative malignancy  - She has done well with fulguration  of ulceration  -Continue to monitor symptoms  2. ? IC /Hunner's ulcer - May consider steroids in the future if her symptoms recur, either intravesical or possibly even systemic especially in light of pathology with plasma cells and eosinophils, suggestive of allergy mediated cystitis  3. Right hydronephrosis  - Likely due to obstruction, most consistent with acquired UPJ obstruction intraoperatively -Otherwise asymptomatic, creatinine normal - Ordered a Lasix renal study to monitor obstruction and assess degree. will call her with the results.  This is a low-grade obstruction and she is otherwise asymptomatic, may just continue to follow conservatively.  If it is high-grade obstruction, would likely need to consider intervention.    Call with Lasix renal study results, follow-up as needed based on these.  Otherwise annually or sooner as needed.   I,Kailey Littlejohn,acting as a Education administrator for [name], MD.,have documented all relevant documentation on the behalf of [name],  MD,as directed by  [name], MD while in the presence of [name], [place] [address] [place], [place] [address] [phone]  I spent 31 total minutes on the day of the encounter including pre-visit review of the medical record, face-to-face time with the patient, and post visit ordering of labs/imaging/tests.

## 2021-12-07 ENCOUNTER — Ambulatory Visit (INDEPENDENT_AMBULATORY_CARE_PROVIDER_SITE_OTHER): Payer: Managed Care, Other (non HMO) | Admitting: Urology

## 2021-12-07 ENCOUNTER — Other Ambulatory Visit: Payer: Self-pay

## 2021-12-07 VITALS — BP 166/82 | HR 80 | Ht 64.0 in | Wt 193.0 lb

## 2021-12-07 DIAGNOSIS — N1330 Unspecified hydronephrosis: Secondary | ICD-10-CM | POA: Diagnosis not present

## 2021-12-09 ENCOUNTER — Ambulatory Visit: Payer: Self-pay | Admitting: Urology

## 2021-12-21 ENCOUNTER — Other Ambulatory Visit: Payer: Self-pay

## 2021-12-21 ENCOUNTER — Ambulatory Visit
Admission: RE | Admit: 2021-12-21 | Discharge: 2021-12-21 | Disposition: A | Discharge status: Home / Self Care | Payer: Managed Care, Other (non HMO) | Source: Ambulatory Visit | Attending: Urology | Admitting: Urology

## 2021-12-21 DIAGNOSIS — N1330 Unspecified hydronephrosis: Secondary | ICD-10-CM | POA: Insufficient documentation

## 2021-12-21 MED ORDER — FUROSEMIDE 10 MG/ML IJ SOLN
43.7500 mg | Freq: Once | INTRAMUSCULAR | Status: AC
  Administered 2021-12-21: 43.75 mg via INTRAVENOUS
  Filled 2021-12-21: qty 4.4

## 2021-12-21 MED ORDER — FUROSEMIDE 10 MG/ML IJ SOLN
43.7500 mg | Freq: Once | INTRAMUSCULAR | Status: DC
  Filled 2021-12-21: qty 4.4

## 2021-12-21 MED ORDER — TECHNETIUM TC 99M MERTIATIDE
5.1800 | Freq: Once | INTRAVENOUS | Status: AC | PRN
  Administered 2021-12-21: 5.18 via INTRAVENOUS

## 2021-12-22 ENCOUNTER — Telehealth: Payer: Self-pay | Admitting: *Deleted

## 2021-12-22 NOTE — Telephone Encounter (Addendum)
Patient informed, voiced understanding. ? ?----- Message from [name], MD sent at 12/21/2021  8:01 PM EST ----- ?Amazing new!  This study is completely normal.  Both kidneys are functioning properly and it does not look like there is any concern for obstruction.  At this time, no further follow-up or intervention is needed for this issue. ? ?[name], MD ? ? ?

## 2021-12-24 ENCOUNTER — Other Ambulatory Visit: Payer: Self-pay | Admitting: Family Medicine

## 2021-12-24 DIAGNOSIS — E1129 Type 2 diabetes mellitus with other diabetic kidney complication: Secondary | ICD-10-CM

## 2021-12-27 ENCOUNTER — Other Ambulatory Visit: Payer: Self-pay | Admitting: Family Medicine

## 2021-12-27 DIAGNOSIS — R809 Proteinuria, unspecified: Secondary | ICD-10-CM

## 2021-12-27 DIAGNOSIS — E1129 Type 2 diabetes mellitus with other diabetic kidney complication: Secondary | ICD-10-CM

## 2021-12-31 ENCOUNTER — Other Ambulatory Visit: Payer: Self-pay | Admitting: Family Medicine

## 2021-12-31 DIAGNOSIS — F411 Generalized anxiety disorder: Secondary | ICD-10-CM

## 2021-12-31 NOTE — Telephone Encounter (Signed)
Patient is no longer a patient of [facility]. She established care with [facility] on 08/23/2021.  ?

## 2022-01-07 ENCOUNTER — Other Ambulatory Visit: Payer: Self-pay | Admitting: Family Medicine

## 2022-01-07 DIAGNOSIS — E1129 Type 2 diabetes mellitus with other diabetic kidney complication: Secondary | ICD-10-CM

## 2022-02-07 ENCOUNTER — Ambulatory Visit: Payer: BC Managed Care – PPO | Admitting: Dermatology

## 2022-02-07 DIAGNOSIS — L988 Other specified disorders of the skin and subcutaneous tissue: Secondary | ICD-10-CM

## 2022-02-07 DIAGNOSIS — D692 Other nonthrombocytopenic purpura: Secondary | ICD-10-CM | POA: Diagnosis not present

## 2022-02-07 DIAGNOSIS — L814 Other melanin hyperpigmentation: Secondary | ICD-10-CM

## 2022-02-07 DIAGNOSIS — D229 Melanocytic nevi, unspecified: Secondary | ICD-10-CM

## 2022-02-07 DIAGNOSIS — L578 Other skin changes due to chronic exposure to nonionizing radiation: Secondary | ICD-10-CM

## 2022-02-07 DIAGNOSIS — Z872 Personal history of diseases of the skin and subcutaneous tissue: Secondary | ICD-10-CM | POA: Diagnosis not present

## 2022-02-07 NOTE — Patient Instructions (Signed)

## 2022-02-07 NOTE — Progress Notes (Signed)
? ?  Follow-Up Visit ?  ?Subjective  ?Virginia Weaver is a 71 y.o. female who presents for the following: Actinic Keratosis (On the nose - S/P 5FU/Calcipotriene x 4 days). The patient has spots, moles and lesions to be evaluated, some may be new or changing. ? ?The following portions of the chart were reviewed this encounter and updated as appropriate:  ? Tobacco  Allergies  Meds  Problems  Med Hx  Surg Hx  Fam Hx   ?  ?Review of Systems:  No other skin or systemic complaints except as noted in HPI or Assessment and Plan. ? ?Objective  ?Well appearing patient in no apparent distress; mood and affect are within normal limits. ? ?A focused examination was performed including the face, arms, and hands. Relevant physical exam findings are noted in the Assessment and Plan. ? ?Nose ?Clear. ? ? ?Assessment & Plan  ?History of actinic keratosis ?Nose ?Clear. Observe for recurrence. Call clinic for new or changing lesions.  Recommend regular skin exams, daily broad-spectrum spf 30+ sunscreen use, and photoprotection.   ? ?Actinic Damage ?- chronic, secondary to cumulative UV radiation exposure/sun exposure over time ?- diffuse scaly erythematous macules with underlying dyspigmentation ?- Recommend daily broad spectrum sunscreen SPF 30+ to sun-exposed areas, reapply every 2 hours as needed.  ?- Recommend staying in the shade or wearing long sleeves, sun glasses (UVA+UVB protection) and wide brim hats (4-inch brim around the entire circumference of the hat). ?- Call for new or changing lesions. ? ?Lentigines ?- Scattered tan macules ?- Due to sun exposure ?- Benign-appering, observe ?- Recommend daily broad spectrum sunscreen SPF 30+ to sun-exposed areas, reapply every 2 hours as needed. ?- Call for any changes ? ?Purpura - Chronic; persistent and recurrent.  Treatable, but not curable. ?- Violaceous macules and patches ?- Benign ?- Related to trauma, age, sun damage and/or use of blood thinners, chronic use of topical  and/or oral steroids ?- Observe ?- Can use OTC arnica containing moisturizer such as Dermend Bruise Formula if desired ?- Call for worsening or other concerns ? ?Melanocytic Nevi ?- Tan-brown and/or pink-flesh-colored symmetric macules and papules ?- Benign appearing on exam today ?- Observation ?- Call clinic for new or changing moles ?- Recommend daily use of broad spectrum spf 30+ sunscreen to sun-exposed areas.  ? ?Return in about 1 year (around 02/08/2023) for TBSE. ? ?I, Rudell Cobb, CMA, am acting as scribe for Sarina Ser, MD . ?Documentation: I have reviewed the above documentation for accuracy and completeness, and I agree with the above. ? ?Sarina Ser, MD ? ?

## 2022-02-08 ENCOUNTER — Other Ambulatory Visit: Payer: Self-pay | Admitting: Family Medicine

## 2022-02-08 DIAGNOSIS — J309 Allergic rhinitis, unspecified: Secondary | ICD-10-CM

## 2022-02-11 ENCOUNTER — Encounter: Payer: Self-pay | Admitting: Urology

## 2022-02-11 ENCOUNTER — Encounter: Payer: Self-pay | Admitting: Dermatology

## 2022-02-22 ENCOUNTER — Ambulatory Visit
Admission: RE | Admit: 2022-02-22 | Discharge: 2022-02-22 | Disposition: A | Discharge status: Home / Self Care | Payer: BC Managed Care – PPO | Source: Ambulatory Visit | Attending: Family Medicine | Admitting: Family Medicine

## 2022-02-22 ENCOUNTER — Other Ambulatory Visit: Payer: Self-pay | Admitting: Family Medicine

## 2022-02-22 DIAGNOSIS — R143 Flatulence: Secondary | ICD-10-CM

## 2022-02-22 DIAGNOSIS — R142 Eructation: Secondary | ICD-10-CM | POA: Diagnosis present

## 2022-02-22 DIAGNOSIS — R140 Abdominal distension (gaseous): Secondary | ICD-10-CM | POA: Diagnosis not present

## 2022-03-29 ENCOUNTER — Other Ambulatory Visit: Payer: Self-pay | Admitting: Family Medicine

## 2022-03-29 DIAGNOSIS — M5416 Radiculopathy, lumbar region: Secondary | ICD-10-CM

## 2022-03-29 DIAGNOSIS — M5412 Radiculopathy, cervical region: Secondary | ICD-10-CM

## 2022-04-06 ENCOUNTER — Other Ambulatory Visit: Payer: Self-pay | Admitting: Family Medicine

## 2022-04-06 DIAGNOSIS — F411 Generalized anxiety disorder: Secondary | ICD-10-CM

## 2022-04-08 ENCOUNTER — Other Ambulatory Visit: Payer: BC Managed Care – PPO

## 2022-06-24 ENCOUNTER — Other Ambulatory Visit: Payer: Self-pay | Admitting: Family Medicine

## 2022-08-22 ENCOUNTER — Encounter (INDEPENDENT_AMBULATORY_CARE_PROVIDER_SITE_OTHER): Payer: Self-pay

## 2022-08-30 ENCOUNTER — Other Ambulatory Visit: Payer: Self-pay | Admitting: Family Medicine

## 2022-08-30 DIAGNOSIS — Z1231 Encounter for screening mammogram for malignant neoplasm of breast: Secondary | ICD-10-CM

## 2022-09-06 ENCOUNTER — Other Ambulatory Visit: Payer: Self-pay | Admitting: Family Medicine

## 2022-09-06 DIAGNOSIS — R748 Abnormal levels of other serum enzymes: Secondary | ICD-10-CM

## 2022-09-07 ENCOUNTER — Ambulatory Visit
Admission: RE | Admit: 2022-09-07 | Discharge: 2022-09-07 | Disposition: A | Discharge status: Home / Self Care | Payer: BC Managed Care – PPO | Source: Ambulatory Visit | Attending: Family Medicine | Admitting: Family Medicine

## 2022-09-07 DIAGNOSIS — R748 Abnormal levels of other serum enzymes: Secondary | ICD-10-CM | POA: Diagnosis present

## 2022-09-07 IMAGING — NM NM RENAL IMAGING FLOW W/ PHARM
3 series · 18 of 18 positions shown · non-contrast
Comparison: CT abdomen and pelvis 09/24/2021

CLINICAL DATA: RIGHT hydronephrosis

EXAM:
NUCLEAR MEDICINE RENAL SCAN WITH DIURETIC ADMINISTRATION
TECHNIQUE: Radionuclide angiographic and sequential renal images were obtained
after intravenous injection of radiopharmaceutical. Imaging was
continued during slow intravenous injection of Lasix approximately
15 minutes after the start of the examination.
RADIOPHARMACEUTICALS:  5.18 mCi Uechnetium-66m MAG3 IV
Pharmaceutical: Lasix 43.75 mg IV

[Series 1000: lasix renal mag 3 (first dynamic renal results) · 7.79mm/px · 6 of 114 frames shown]
[frame 10/114]
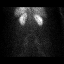
[frame 29/114]
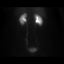
[frame 48/114]
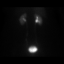
[frame 67/114]
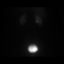
[frame 86/114]
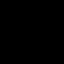
[frame 105/114]
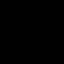

[Series 1000: lasix renal mag 3 (results) · 7.79mm/px · 6 of 114 frames shown]
[frame 10/114]
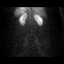
[frame 29/114]
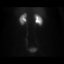
[frame 48/114]
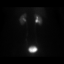
[frame 67/114]
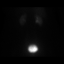
[frame 86/114]
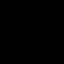
[frame 105/114]
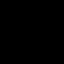

[Series 1000: lasix renal mag 3 · 7.79mm/px · 6 of 114 frames shown]
[frame 10/114]
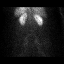
[frame 29/114]
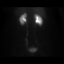
[frame 48/114]
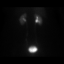
[frame 67/114]
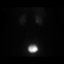
[frame 86/114]
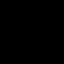
[frame 105/114]
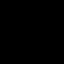

[18 of 18 positions shown; findings below may reference images not displayed]

FINDINGS: Flow:  Prompt symmetric arterial flow to the kidneys.

Left renogram: Normal uptake, concentration and excretion of tracer.
Good clearance of tracer before and continuing following Lasix
administration. No retained tracer at conclusion of exam. Analysis
of renogram curve demonstrates a normal time to peak activity of
minutes with fall to half maximum activity at 10 minutes.

Right renogram: Normal uptake, concentration, and excretion of
tracer by RIGHT kidney. Minimal collecting system dilatation RIGHT
kidney. Good clearance of tracer before and continuing following
Lasix administration. No abnormal retained tracer at conclusion of
exam. Analysis of the renogram curve demonstrates a time to peak
activity of 5.2 minutes with fall to half maximum activity at 12
minutes.

Differential:

Left kidney = 46 %

Right kidney = 54 %

T1/2 post Lasix :

Left kidney = 3.7 min

Right kidney = 4.4 min
IMPRESSION: Minimal collecting system dilatation RIGHT kidney.

Otherwise normal exam.

No evidence of urinary outflow obstruction from either kidney.

## 2022-11-07 ENCOUNTER — Emergency Department
Admission: EM | Admit: 2022-11-07 | Discharge: 2022-11-07 | Disposition: A | Discharge status: Home / Self Care | Payer: BC Managed Care – PPO | Attending: Emergency Medicine | Admitting: Emergency Medicine

## 2022-11-07 ENCOUNTER — Other Ambulatory Visit: Payer: Self-pay

## 2022-11-07 ENCOUNTER — Emergency Department: Payer: BC Managed Care – PPO

## 2022-11-07 DIAGNOSIS — E119 Type 2 diabetes mellitus without complications: Secondary | ICD-10-CM | POA: Insufficient documentation

## 2022-11-07 DIAGNOSIS — R1030 Lower abdominal pain, unspecified: Secondary | ICD-10-CM | POA: Diagnosis present

## 2022-11-07 DIAGNOSIS — R911 Solitary pulmonary nodule: Secondary | ICD-10-CM | POA: Insufficient documentation

## 2022-11-07 DIAGNOSIS — Z7984 Long term (current) use of oral hypoglycemic drugs: Secondary | ICD-10-CM | POA: Diagnosis not present

## 2022-11-07 DIAGNOSIS — I10 Essential (primary) hypertension: Secondary | ICD-10-CM | POA: Diagnosis not present

## 2022-11-07 DIAGNOSIS — K529 Noninfective gastroenteritis and colitis, unspecified: Secondary | ICD-10-CM | POA: Insufficient documentation

## 2022-11-07 LAB — CBC
HCT: 40.2 % (ref 36.0–46.0)
Hemoglobin: 13.3 g/dL (ref 12.0–15.0)
MCH: 30.1 pg (ref 26.0–34.0)
MCHC: 33.1 g/dL (ref 30.0–36.0)
MCV: 91 fL (ref 80.0–100.0)
Platelets: 189 10*3/uL (ref 150–400)
RBC: 4.42 MIL/uL (ref 3.87–5.11)
RDW: 13 % (ref 11.5–15.5)
WBC: 7 10*3/uL (ref 4.0–10.5)
nRBC: 0 % (ref 0.0–0.2)

## 2022-11-07 LAB — COMPREHENSIVE METABOLIC PANEL
ALT: 15 U/L (ref 0–44)
AST: 25 U/L (ref 15–41)
Albumin: 4.1 g/dL (ref 3.5–5.0)
Alkaline Phosphatase: 47 U/L (ref 38–126)
Anion gap: 7 (ref 5–15)
BUN: 17 mg/dL (ref 8–23)
CO2: 28 mmol/L (ref 22–32)
Calcium: 9 mg/dL (ref 8.9–10.3)
Chloride: 102 mmol/L (ref 98–111)
Creatinine, Ser: 0.67 mg/dL (ref 0.44–1.00)
GFR, Estimated: >60 mL/min (ref >60)
Glucose, Bld: 236 mg/dL — ABNORMAL HIGH (ref 70–99)
Potassium: 3.6 mmol/L (ref 3.5–5.1)
Sodium: 137 mmol/L (ref 135–145)
Total Bilirubin: 0.7 mg/dL (ref 0.3–1.2)
Total Protein: 6.6 g/dL (ref 6.5–8.1)

## 2022-11-07 LAB — TROPONIN I (HIGH SENSITIVITY): Troponin I (High Sensitivity): 4 ng/L (ref <18)

## 2022-11-07 LAB — URINALYSIS, ROUTINE W REFLEX MICROSCOPIC
Bacteria, UA: NONE SEEN
Bilirubin Urine: NEGATIVE
Glucose, UA: >=500 mg/dL — AB
Hgb urine dipstick: NEGATIVE
Ketones, ur: NEGATIVE mg/dL
Leukocytes,Ua: NEGATIVE
Nitrite: NEGATIVE
Protein, ur: NEGATIVE mg/dL
Specific Gravity, Urine: 1.02 (ref 1.005–1.030)
pH: 6 (ref 5.0–8.0)

## 2022-11-07 LAB — LIPASE, BLOOD: Lipase: 48 U/L (ref 11–51)

## 2022-11-07 MED ORDER — PANTOPRAZOLE SODIUM 40 MG IV SOLR
40.0000 mg | Freq: Once | INTRAVENOUS | Status: AC
  Administered 2022-11-07: 40 mg via INTRAVENOUS
  Filled 2022-11-07: qty 10

## 2022-11-07 MED ORDER — IOHEXOL 300 MG/ML  SOLN
100.0000 mL | Freq: Once | INTRAMUSCULAR | Status: AC | PRN
  Administered 2022-11-07: 100 mL via INTRAVENOUS

## 2022-11-07 MED ORDER — SODIUM CHLORIDE 0.9 % IV BOLUS (SEPSIS)
1000.0000 mL | Freq: Once | INTRAVENOUS | Status: AC
  Administered 2022-11-07: 1000 mL via INTRAVENOUS

## 2022-11-07 MED ORDER — ONDANSETRON 4 MG PO TBDP: 4.0000 mg | ORAL_TABLET | Freq: Four times a day (QID) | ORAL | 0 refills | Status: DC | PRN

## 2022-11-07 MED ORDER — ONDANSETRON HCL 4 MG/2ML IJ SOLN
4.0000 mg | Freq: Once | INTRAMUSCULAR | Status: AC
  Administered 2022-11-07: 4 mg via INTRAVENOUS
  Filled 2022-11-07: qty 2

## 2022-11-07 MED ORDER — DICYCLOMINE HCL 20 MG PO TABS: 20.0000 mg | ORAL_TABLET | Freq: Three times a day (TID) | ORAL | 0 refills | Status: DC | PRN

## 2022-11-07 MED ORDER — MORPHINE SULFATE (PF) 4 MG/ML IV SOLN
4.0000 mg | Freq: Once | INTRAVENOUS | Status: AC
  Administered 2022-11-07: 4 mg via INTRAVENOUS
  Filled 2022-11-07: qty 1

## 2022-11-07 MED ORDER — PANTOPRAZOLE SODIUM 40 MG PO TBEC: 40.0000 mg | DELAYED_RELEASE_TABLET | Freq: Every day | ORAL | 1 refills | Status: AC

## 2022-11-07 NOTE — ED Triage Notes (Signed)
Diffuse abdominal pain that started yesterday around 11:30 am, got worse as the day went on, having cramping pain now that woke her up from sleep.

## 2022-11-07 NOTE — Discharge Instructions (Addendum)
You may take over-the-counter Tylenol 1000 mg every 6 hours as needed for pain.  You may use Imodium over-the-counter as needed for diarrhea.  Your CT scan showed signs of inflammation on your stomach and small intestine that could suggest inflammation of the stomach called gastritis or inflammation of the small bowel called enteritis that is likely caused by a viral illness.  This can cause vomiting and diarrhea.  We are starting you on Protonix for this and will give you GI follow-up if symptoms or not improving.  CT scan also found that there was some thickening of the left side of your bladder wall and they recommend that you follow-up with a urologist as an outpatient to have a cystoscopy to look at this further.  Your urine showed no sign of infection.  You were also incidentally found to have a pulmonary nodule that we will need to be followed every 6 to 12 months.  I have sent a referral into the pulmonary nodule clinic.  Otherwise your lab work, urine today was reassuring.  I recommend a bland diet for the next several days.   IMPRESSION:  1. Thickened folds in the antropyloric stomach, jejunum and portions  of the ileum. Query gastritis or peptic ulcer disease, nonspecific  enteritis. No bowel obstruction or inflammatory changes.  2. Diverticulosis without evidence of diverticulitis.  3. Bladder thickening versus underdistention. The bladder wall  asymmetrically thicker to the left. Correlate clinically for  cystitis and/or hematuria. Follow-up cystoscopy recommended.  4. Aortic and coronary artery atherosclerosis.  5. 6 mm subpleural right middle lobe nodule. Non-contrast chest CT  at 6-12 months is recommended. If the nodule is stable at time of  repeat CT, then future CT at 18-24 months (from today's scan) is  considered optional for low-risk patients, but is recommended for  high-risk patients. This recommendation follows the consensus  statement: Guidelines for Management of  Incidental Pulmonary Nodules  Detected on CT Images: From the Fleischner Society 2017; Radiology  2017; 284:228-243.  6. Probable chronic esophagitis. Endoscopy may be indicated but this  seems unchanged from the prior study.  7. Degenerative changes of the spine.  8. Additional chronic findings described above.

## 2022-11-07 NOTE — ED Triage Notes (Signed)
First nurse note- From home via [facility] with c/o abd cramps x several hours. Had BM that relieved pain minimally per EMS.

## 2022-11-07 NOTE — ED Notes (Signed)
Went over d/c paperwork at this time with patient. Pt had no questions, comments or concerns after review and verbally understood them.  

## 2022-11-07 NOTE — ED Provider Notes (Signed)
[facility] Provider Note    Event Date/Time   First MD Initiated Contact with Patient 11/07/22 0430     (approximate)   History   Abdominal Pain   HPI  Virginia Weaver is a 72 y.o. female with history of hypertension, diabetes who presents to the emergency department with diffuse lower abdominal pain that she describes as a cramping that started yesterday.  No nausea, vomiting, diarrhea, dysuria, hematuria, vaginal bleeding or discharge.  She has had prior BTL.   History provided by patient and family.    Past Medical History:  Diagnosis Date   Acid reflux 04/02/2001   Adiposity 04/20/1993   Anemia    h/o   Arthritis    Benign essential HTN 11/11/1993   Carotid atherosclerosis, bilateral    COVID-19 08/2021   Diabetes mellitus without complication (HCC)    diet    Heart murmur    Hypertension    RSV (respiratory syncytial virus infection) 09/2021   Syncope and collapse 01/20/2009   Tinnitus    left ear   Urticaria 06/04/2015    Past Surgical History:  Procedure Laterality Date   CYSTOSCOPY W/ RETROGRADES Bilateral 11/04/2021   Procedure: CYSTOSCOPY WITH RETROGRADE PYELOGRAM;  Surgeon: [name], MD;  Location: [facility] ORS;  Service: Urology;  Laterality: Bilateral;   CYSTOSCOPY WITH BIOPSY N/A 12/22/2015   Procedure: CYSTOSCOPY WITH BIOPSY;  Surgeon: [name], MD;  Location: [facility] ORS;  Service: Urology;  Laterality: N/A;   CYSTOSCOPY WITH BIOPSY N/A 11/04/2021   Procedure: CYSTOSCOPY WITH BLADDER  BIOPSY;  Surgeon: [name], MD;  Location: [facility] ORS;  Service: Urology;  Laterality: N/A;   CYSTOSCOPY WITH FULGERATION N/A 11/04/2021   Procedure: CYSTOSCOPY WITH FULGERATION;  Surgeon: [name], MD;  Location: [facility] ORS;  Service: Urology;  Laterality: N/A;   TUBAL LIGATION  1987   TUMMY TUCK  06/17/2010   URETEROSCOPY Right 11/04/2021   Procedure: DIAGNOSTIC URETEROSCOPY;  Surgeon: [name], MD;  Location: [facility]  ORS;  Service: Urology;  Laterality: Right;    MEDICATIONS:  Prior to Admission medications   Medication Sig Start Date End Date Taking? Authorizing Provider  ALPRAZolam Duanne Moron) 0.5 MG tablet Take 1 tablet (0.5 mg total) by mouth daily as needed for up to 45 doses for anxiety (may repeat dose if needed; max of 15 times/month). Patient taking differently: Take 0.5 mg by mouth at bedtime. 10/08/21   Gwyneth Sprout, FNP  Bioflavonoid Products (ESTER C PO) Take 1,000 mg by mouth daily.    [provider]  calcium carbonate (OS-CAL) 600 MG TABS tablet Take 1 tablet by mouth 2 (two) times daily with a meal.    [provider]  Cholecalciferol 1000 UNITS capsule Take 1,000 Units by mouth daily.    [provider]  Cinnamon 500 MG capsule Take 1,000 mg by mouth 2 (two) times daily.    [provider]  Coenzyme Q10 (COQ10) 200 MG CAPS Take 1 capsule by mouth daily. 12/15/16   [name], PA-C  docusate sodium (COLACE) 100 MG capsule Take 100 mg by mouth daily.    [provider]  fluorouracil (EFUDEX) 5 % cream Apply to affected area on nose twice a day x 4 days 11/09/21   Moye, [place], MD  gabapentin (NEURONTIN) 100 MG capsule TAKE 1 CAPSULE EVERY MORNING  AND TAKE 2 CAPSULES AT BEDTIME 11/04/20   [name], PA-C  Ginkgo Biloba 50 MG CAPS Take 50 mg by  mouth daily.    [provider]  HYDROcodone-acetaminophen (NORCO/VICODIN) 5-325 MG tablet Take 1-2 tablets by mouth every 6 (six) hours as needed for moderate pain. 11/04/21   [name], MD  hydrocortisone (ANUSOL-HC) 25 MG suppository UNWRAP AND INSERT 1 SUPPOSITORY RECALLY TWICE A DAY 04/21/20   Fenton Malling M, PA-C  Lactobacillus (PROBIOTIC ACIDOPHILUS PO) Take 1 capsule by mouth daily.    [provider]  levocetirizine (XYZAL) 5 MG tablet TAKE 1 TABLET BY MOUTH DAILY Patient taking differently: every morning. 10/01/21   Birdie Sons, MD  metFORMIN  (GLUCOPHAGE) 500 MG tablet TAKE ONE TABLET BY MOUTH TWICE DAILY WITH A MEAL 11/29/21   Birdie Sons, MD  montelukast (SINGULAIR) 10 MG tablet TAKE 1 TABLET BY MOUTH DAILY 06/30/20   [name], PA-C  Multiple Vitamins-Minerals (HAIR/SKIN/NAILS) TABS Take 1 tablet by mouth in the morning and at bedtime.    [provider]  Omega-3 Fatty Acids (FISH OIL) 500 MG CAPS Take 500 mg by mouth daily.    [provider]  ondansetron (ZOFRAN-ODT) 4 MG disintegrating tablet Take 1 tablet (4 mg total) by mouth every 8 (eight) hours as needed for nausea or vomiting. 11/04/21   [name], MD  pravastatin (PRAVACHOL) 40 MG tablet Take 40 mg by mouth at bedtime. 10/20/20   [provider]  ramipril (ALTACE) 5 MG capsule Take 5 mg by mouth at bedtime. 10/20/20 10/27/21  [provider]  SUPER B COMPLEX & C TABS Take 1 tablet by mouth daily.    [provider]  Tapinarof (VTAMA) 1 % CREA Apply 1 application topically daily. 11/09/21   Moye, [place], MD  triamcinolone cream (KENALOG) 0.1 % USE TOPICALLY TWICE DAILY AS NEEDED 05/24/21   Chrismon, Vickki Muff, PA-C    Physical Exam   Triage Vital Signs: ED Triage Vitals  Enc Vitals Group     BP 11/07/22 0400 (!) 160/79     Pulse Rate 11/07/22 0400 69     Resp 11/07/22 0400 18     Temp 11/07/22 0400 (!) 97.5 F (36.4 C)     Temp Source 11/07/22 0400 Oral     SpO2 11/07/22 0400 97 %     Weight 11/07/22 0403 185 lb (83.9 kg)     Height 11/07/22 0403 '5\' 3"'$  (1.6 m)     Head Circumference --      Peak Flow --      Pain Score 11/07/22 0403 7     Pain Loc --      Pain Edu? --      Excl. in [place]? --     Most recent vital signs: Vitals:   11/07/22 0400  BP: (!) 160/79  Pulse: 69  Resp: 18  Temp: (!) 97.5 F (36.4 C)  SpO2: 97%    CONSTITUTIONAL: Alert and oriented and responds appropriately to questions. Well-appearing; well-nourished HEAD: Normocephalic, atraumatic EYES: Conjunctivae clear, pupils  appear equal, sclera nonicteric ENT: normal nose; moist mucous membranes NECK: Supple, normal ROM CARD: RRR; S1 and S2 appreciated; no murmurs, no clicks, no rubs, no gallops RESP: Normal chest excursion without splinting or tachypnea; breath sounds clear and equal bilaterally; no wheezes, no rhonchi, no rales, no hypoxia or respiratory distress, speaking full sentences ABD/GI: Nondistended.  Soft.  Tender throughout the lower abdomen without guarding or rebound.  Nonperitoneal. BACK: The back appears normal EXT: Normal ROM in all joints; no deformity noted, no edema; no cyanosis SKIN: Normal color for  age and race; warm; no rash on exposed skin NEURO: Moves all extremities equally, normal speech PSYCH: The patient's mood and manner are appropriate.   ED Results / Procedures / Treatments   LABS: (all labs ordered are listed, but only abnormal results are displayed) Labs Reviewed  COMPREHENSIVE METABOLIC PANEL - Abnormal; Notable for the following components:      Result Value   Glucose, Bld 236 (*)    All other components within normal limits  URINALYSIS, ROUTINE W REFLEX MICROSCOPIC - Abnormal; Notable for the following components:   Color, Urine YELLOW (*)    APPearance CLEAR (*)    Glucose, UA >=500 (*)    All other components within normal limits  LIPASE, BLOOD  CBC  TROPONIN I (HIGH SENSITIVITY)     EKG:  EKG Interpretation  Date/Time:  Monday November 07 2022 04:17:41 EST Ventricular Rate:  68 PR Interval:  156 QRS Duration: 92 QT Interval:  394 QTC Calculation: 418 R Axis:   22 Text Interpretation: Normal sinus rhythm Normal ECG When compared with ECG of 10-Dec-2000 15:02, Vent. rate has decreased BY  38 BPM Confirmed by Pryor Curia [phone]) on 11/07/2022 4:31:47 AM         RADIOLOGY: My personal review and interpretation of imaging: CT scan shows no significant abnormality.  I have personally reviewed all radiology reports.   CT ABDOMEN PELVIS W  CONTRAST  Result Date: 11/07/2022 CLINICAL DATA:  Abdominal pain. EXAM: CT ABDOMEN AND PELVIS WITH CONTRAST TECHNIQUE: Multidetector CT imaging of the abdomen and pelvis was performed using the standard protocol following bolus administration of intravenous contrast. RADIATION DOSE REDUCTION: This exam was performed according to the departmental dose-optimization program which includes automated exposure control, adjustment of the mA and/or kV according to patient size and/or use of iterative reconstruction technique. CONTRAST:  173m OMNIPAQUE IOHEXOL 300 MG/ML  SOLN COMPARISON:  CT with IV contrast 09/24/2021, 11/26/2015. FINDINGS: Lower chest: There is mild thickening of the distal thoracic esophagus but no more than previously. Probable chronic esophagitis. Endoscopy may be indicated but this seems unchanged. There are calcifications in the coronary arteries and mitral ring. The cardiac size is normal. There is no pericardial effusion. Above the plane of the prior studies there is a 6 mm subpleural solid right middle lobe nodule anteriorly on 4:12. There is a 3 mm stable right middle lobe nodule on 4:18. There is linear scarring in the base of the right middle lobe. Remaining lung bases are clear. Hepatobiliary: No focal liver abnormality is seen. No calcified gallstones, gallbladder wall thickening, or biliary dilatation. Pancreas: No abnormality. Spleen: No abnormality. Adrenals/Urinary Tract: Adrenal glands are unremarkable. Kidneys are normal, without renal calculi, focal lesion, or hydronephrosis. There is mild bladder thickening versus underdistention. The bladder wall is asymmetrically thicker to the left and cystoscopy is recommended. Correlate clinically for cystitis and/or hematuria. Symmetric renal excretion is noted in the delayed phase. Stomach/Bowel: There are antropyloric gastric thickened folds. Query gastritis or peptic ulcer disease. The unopacified small bowel bowel is normal caliber. There  are thickened folds in the jejunum and portions of the ileum but no inflammatory changes. The appendix is normal. The large bowel wall normal in thickness with uncomplicated left-sided diverticula. Vascular/Lymphatic: Aortic atherosclerosis. No enlarged abdominal or pelvic lymph nodes. Reproductive: Uterus and bilateral adnexa are unremarkable. Other: Surgical clips are again noted scattered over the abdominal wall, a few in the right groin area. There is no free air, free fluid, free hemorrhage or incarcerated hernia.  There are calcified injection granulomas in both buttocks. Musculoskeletal: Advanced facet hypertrophy L4-5 with grade 1 spondylolisthesis. Mild lumbar spondylosis. Advanced degenerative disc collapse L5-S1. No acute or significant osseous findings or suspicious lesions. IMPRESSION: 1. Thickened folds in the antropyloric stomach, jejunum and portions of the ileum. Query gastritis or peptic ulcer disease, nonspecific enteritis. No bowel obstruction or inflammatory changes. 2. Diverticulosis without evidence of diverticulitis. 3. Bladder thickening versus underdistention. The bladder wall asymmetrically thicker to the left. Correlate clinically for cystitis and/or hematuria. Follow-up cystoscopy recommended. 4. Aortic and coronary artery atherosclerosis. 5. 6 mm subpleural right middle lobe nodule. Non-contrast chest CT at 6-12 months is recommended. If the nodule is stable at time of repeat CT, then future CT at 18-24 months (from today's scan) is considered optional for low-risk patients, but is recommended for high-risk patients. This recommendation follows the consensus statement: Guidelines for Management of Incidental Pulmonary Nodules Detected on CT Images: From the Fleischner Society 2017; Radiology 2017; 284:228-243. 6. Probable chronic esophagitis. Endoscopy may be indicated but this seems unchanged from the prior study. 7. Degenerative changes of the spine. 8. Additional chronic findings  described above. Aortic Atherosclerosis (ICD10-I70.0). Electronically Signed   By: Telford Nab M.D.   On: 11/07/2022 06:19     PROCEDURES:  Critical Care performed: No   Procedures    IMPRESSION / MDM / ASSESSMENT AND PLAN / ED COURSE  I reviewed the triage vital signs and the nursing notes.    Patient here with lower abdominal pain, cramping.     DIFFERENTIAL DIAGNOSIS (includes but not limited to):   Viral gastroenteritis, colitis, diverticulitis, UTI, kidney stone, pyelonephritis appendicitis   Patient's presentation is most consistent with acute presentation with potential threat to life or bodily function.   PLAN: Will obtain CBC, CMP, lipase, urinalysis, CT of the abdomen pelvis.  Will give IV fluids, pain and nausea medicine.  Will keep NPO.   MEDICATIONS GIVEN IN ED: Medications  pantoprazole (PROTONIX) injection 40 mg (has no administration in time range)  sodium chloride 0.9 % bolus 1,000 mL (1,000 mLs Intravenous New Bag/Given 11/07/22 0525)  morphine (PF) 4 MG/ML injection 4 mg (4 mg Intravenous Given 11/07/22 0526)  ondansetron (ZOFRAN) injection 4 mg (4 mg Intravenous Given 11/07/22 0525)  iohexol (OMNIPAQUE) 300 MG/ML solution 100 mL (100 mLs Intravenous Contrast Given 11/07/22 0458)     ED COURSE: Patient's labs show no leukocytosis, normal hemoglobin, normal electrolytes, LFTs and lipase.  Glucose elevated but no DKA.  She has known history of diabetes.  Troponin negative but not having chest pain or shortness of breath.  Does not need repeat troponin.  EKG nonischemic.  Urine shows no sign of infection.  CT of the abdomen pelvis reviewed and interpreted by myself and the radiologist and shows thickened folds near the stomach, jejunum and parts of the ileum.  This seems more likely secondary to enteritis given patient complaining of lower abdominal pain, cramping.  She has some bladder wall thickening versus underdistention but no symptoms of UTI and  urinalysis does not appear infected.  Will have her follow-up with urology as they also comment there is some asymmetry of the bladder wall and it is thicker on the left side.  She also incidentally has a right pulmonary nodule.  Will have her follow-up with the pulmonary nodule clinic.  Will give Protonix here and p.o. challenge.    At this time, I do not feel there is any life-threatening condition present. I reviewed all nursing  notes, vitals, pertinent previous records.  All lab and urine results, EKGs, imaging ordered have been independently reviewed and interpreted by myself.  I reviewed all available radiology reports from any imaging ordered this visit.  Based on my assessment, I feel the patient is safe to be discharged home without further emergent workup and can continue workup as an outpatient as needed. Discussed all findings, treatment plan as well as usual and customary return precautions.  They verbalize understanding and are comfortable with this plan.  Outpatient follow-up has been provided as needed.  All questions have been answered.  CONSULTS:  none   OUTSIDE RECORDS REVIEWED: Reviewed last internal medicine visit with Adrian Prows on 10/25/2022.       FINAL CLINICAL IMPRESSION(S) / ED DIAGNOSES   Final diagnoses:  Lower abdominal pain  Enteritis  Pulmonary nodule     Rx / DC Orders   ED Discharge Orders          Ordered    AMB  Referral to Pulmonary Nodule Clinic        11/07/22 0628    dicyclomine (BENTYL) 20 MG tablet  Every 8 hours PRN        11/07/22 0635    ondansetron (ZOFRAN-ODT) 4 MG disintegrating tablet  Every 6 hours PRN        11/07/22 0635    pantoprazole (PROTONIX) 40 MG tablet  Daily        11/07/22 6222             Note:  This document was prepared using Dragon voice recognition software and may include unintentional dictation errors.   Melonee Gerstel, Delice Bison, DO 11/07/22 [phone]

## 2022-11-09 IMAGING — US US ABDOMEN COMPLETE
1 series · 14 of 25 positions shown · non-contrast
Comparison: None Available.

CLINICAL DATA: Abdominal bloating

EXAM:
ABDOMEN ULTRASOUND COMPLETE

[Series 1: us abdomen complete · 14 of 114 slices shown]
[im 1/114]
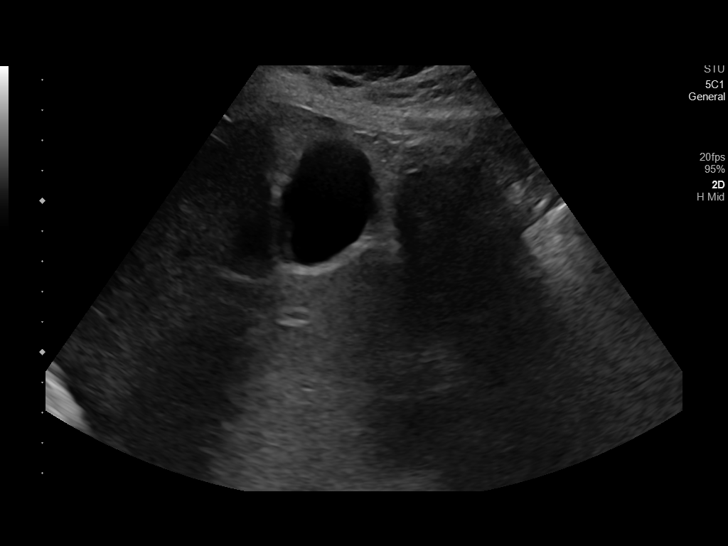
[im 10/114]
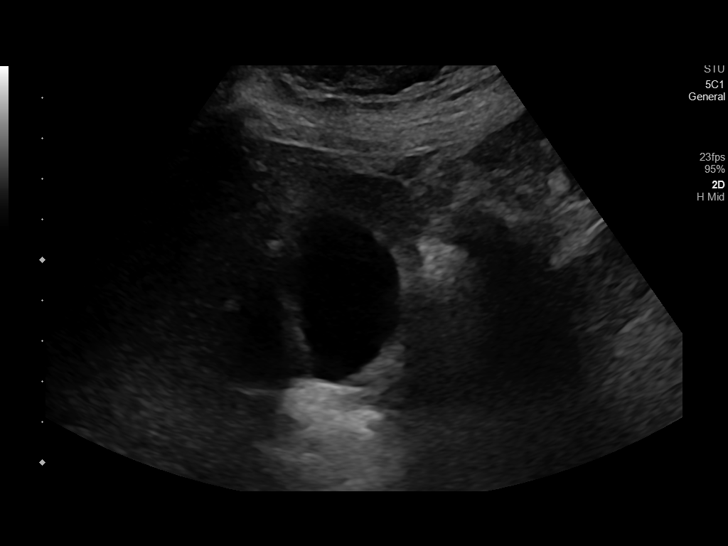
[im 19/114]
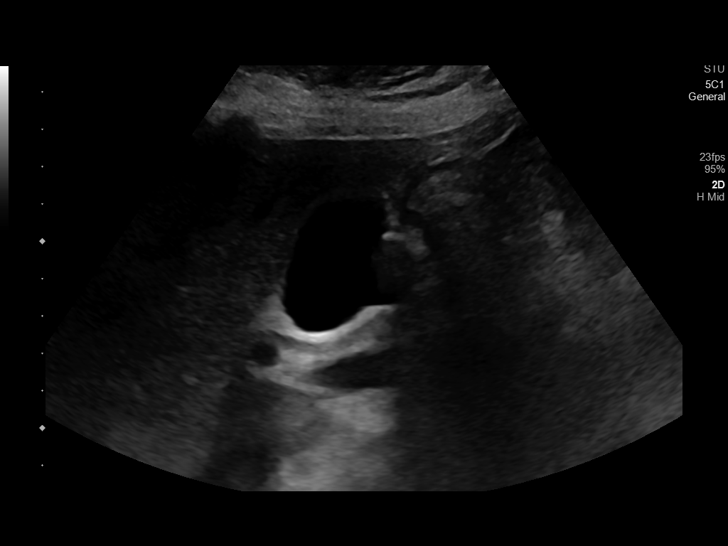
[im 29/114]
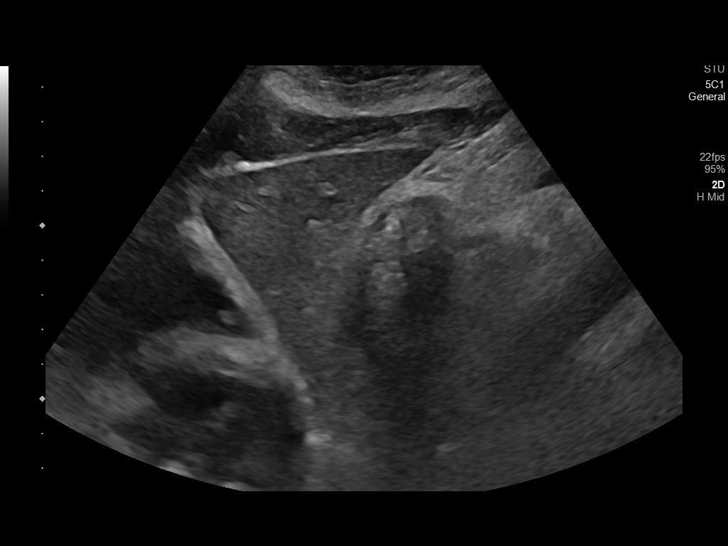
[im 38/114]
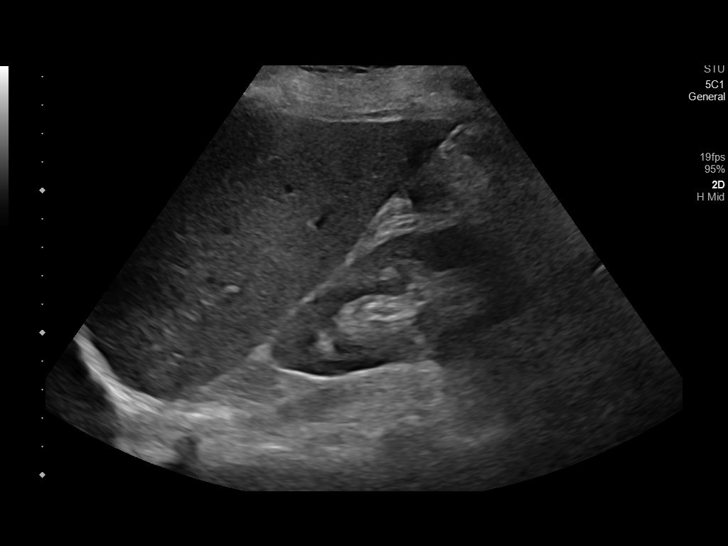
[im 43/114]
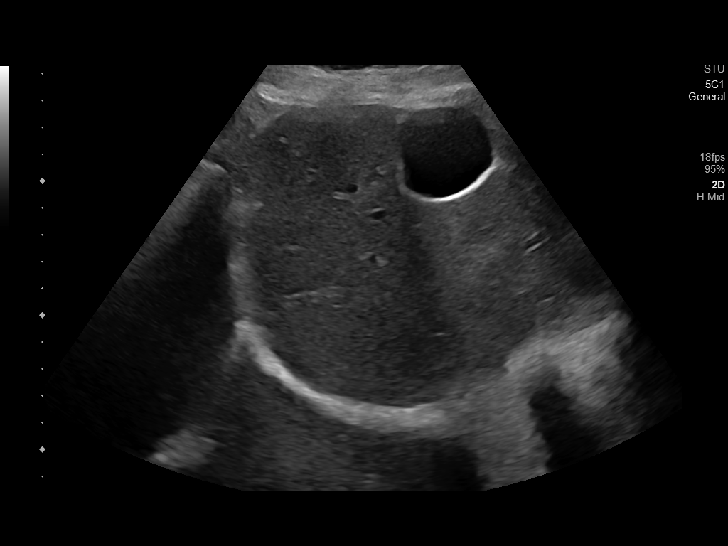
[im 52/114]
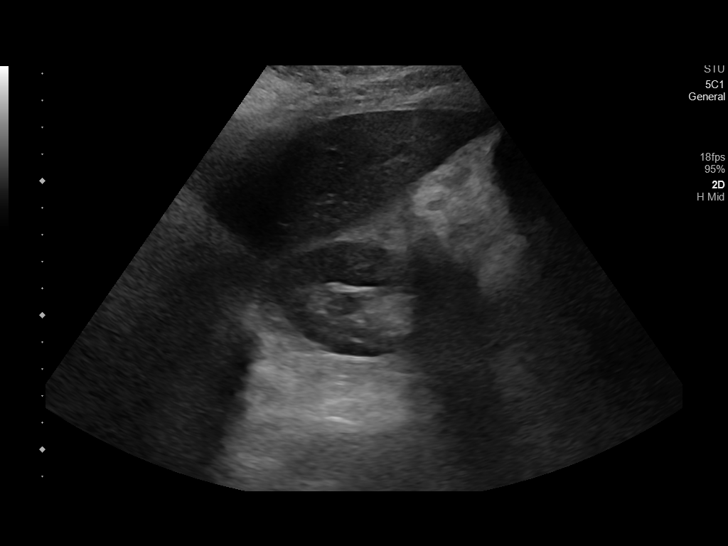
[im 62/114]
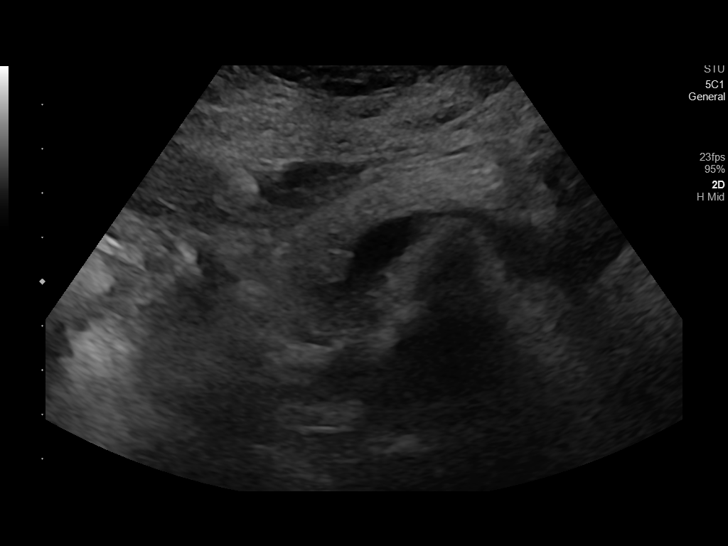
[im 71/114]
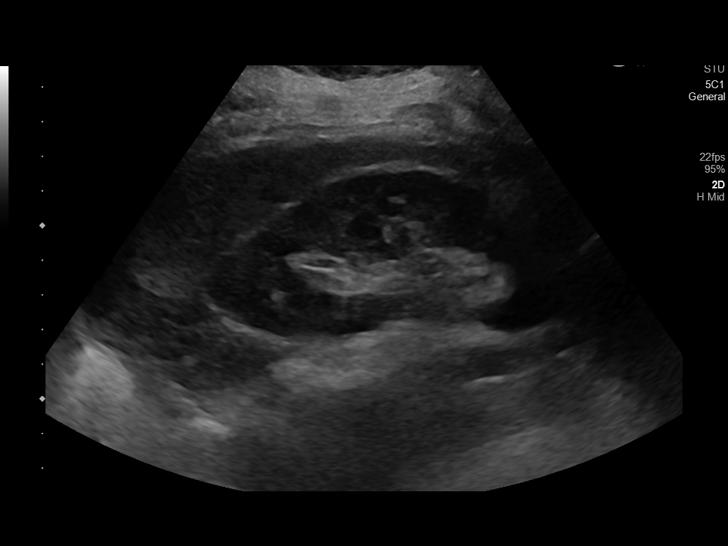
[im 76/114]
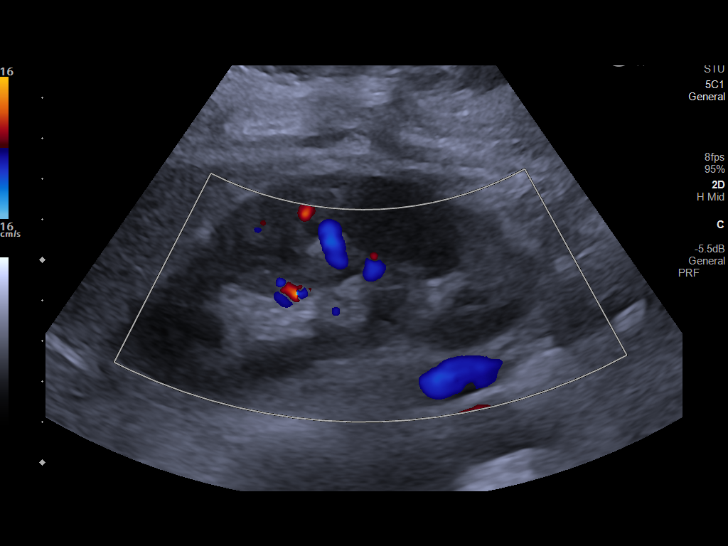
[im 85/114]
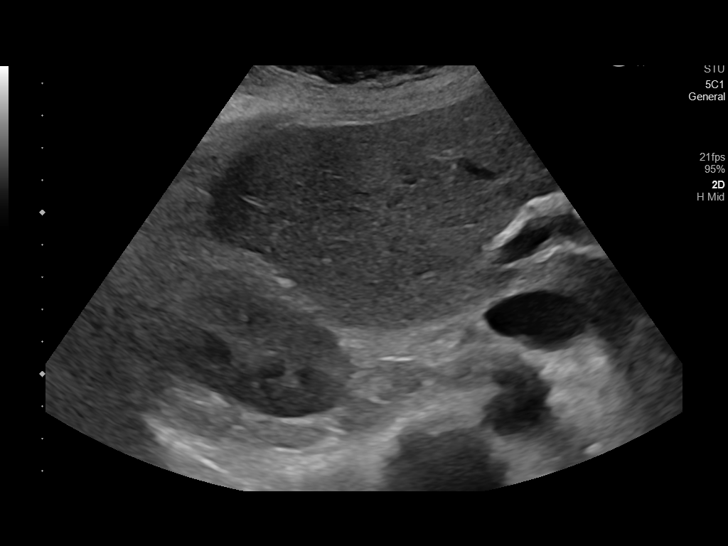
[im 95/114]
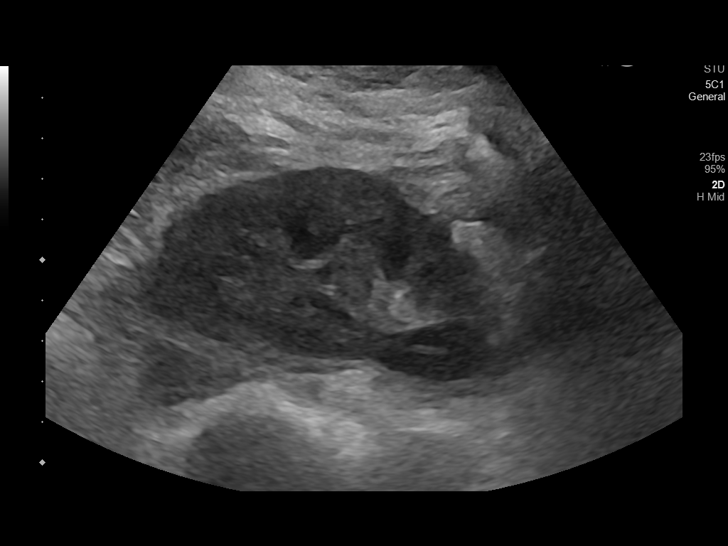
[im 104/114]
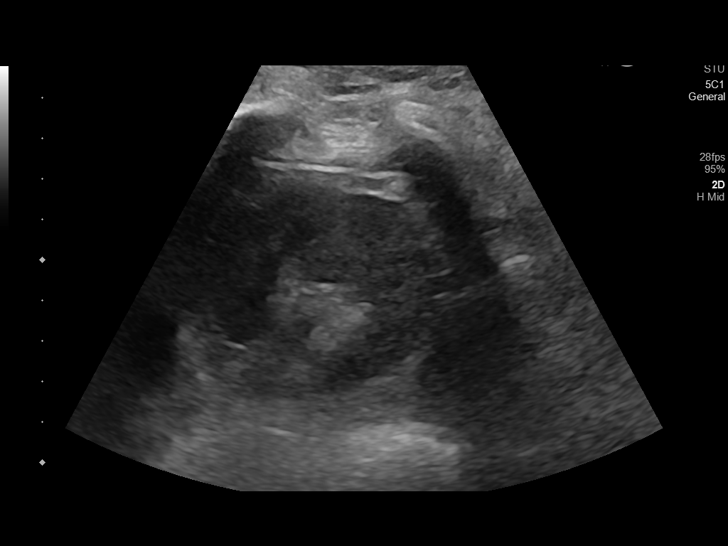
[im 114/114]
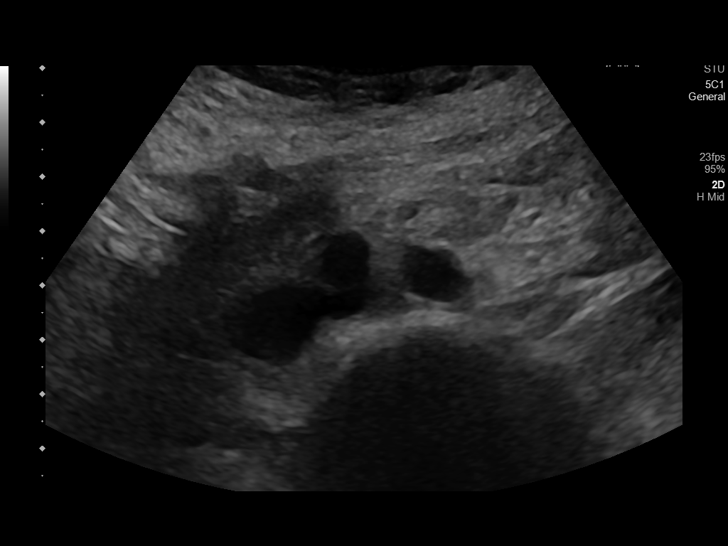

[14 of 25 positions shown; findings below may reference images not displayed]

FINDINGS: Gallbladder: No gallstones or wall thickening visualized. No
sonographic Murphy sign noted by sonographer.

Common bile duct: Diameter: 3 mm

Liver: No focal lesion identified. Within normal limits in
parenchymal echogenicity. Portal vein is patent on color Doppler
imaging with normal direction of blood flow towards the liver.

IVC: No abnormality visualized.

Pancreas: Visualized portion unremarkable.

Spleen: Size and appearance within normal limits.

Right Kidney: Length: 10.7 cm. Echogenicity within normal limits. No
mass or hydronephrosis visualized.

Left Kidney: Length: 9.2 cm. Echogenicity within normal limits. No
mass or hydronephrosis visualized.

Abdominal aorta: No aneurysm visualized.

Other findings: None.
IMPRESSION: No sonographic abnormality is seen in the abdomen.

## 2022-11-22 ENCOUNTER — Ambulatory Visit: Payer: BC Managed Care – PPO | Admitting: Dermatology

## 2022-11-22 ENCOUNTER — Encounter: Payer: Self-pay | Admitting: Dermatology

## 2022-11-22 VITALS — BP 155/70 | HR 60

## 2022-11-22 DIAGNOSIS — R202 Paresthesia of skin: Secondary | ICD-10-CM | POA: Diagnosis not present

## 2022-11-22 DIAGNOSIS — L309 Dermatitis, unspecified: Secondary | ICD-10-CM | POA: Diagnosis not present

## 2022-11-22 DIAGNOSIS — L409 Psoriasis, unspecified: Secondary | ICD-10-CM | POA: Diagnosis not present

## 2022-11-22 MED ORDER — CLOBETASOL PROPIONATE 0.05 % EX SOLN: CUTANEOUS | 3 refills | Status: DC

## 2022-11-22 NOTE — Patient Instructions (Addendum)
Back: Recommend OTC Gold Bond Rapid Relief Anti-Itch cream (pramoxine + menthol), CeraVe Anti-itch cream or lotion (pramoxine), Sarna lotion (Original- menthol + camphor or Sensitive- pramoxine) or Eucerin 12 hour Itch Relief lotion (menthol) up to 3 times per day to areas on body that are itchy.  OTC treatments which can help with itch include numbing creams like pramoxine or lidocaine which temporarily reduce itch or Capsaicin-containing creams which cause a burning sensation but which sometimes over time will reset the nerves to stop producing itch.  - If you choose to use Capsaicin cream, it is recommended to use it 5 times daily for 1 week followed by 3 times daily for 3-6 weeks. You may have to continue using it long-term.  - For severe cases, there are some prescription cream or pill options which may help.   Psoriasis: Start Clobetasol solution once or twice daily to affected areas on scalp as needed for itching. Avoid applying to face, groin, and axilla. Use as directed. Long-term use can cause thinning of the skin.  Topical steroids (such as triamcinolone, fluocinolone, fluocinonide, mometasone, clobetasol, halobetasol, betamethasone, hydrocortisone) can cause thinning and lightening of the skin if they are used for too long in the same area. Your physician has selected the right strength medicine for your problem and area affected on the body. Please use your medication only as directed by your physician to prevent side effects.    Recommend Selsun Blue Naturals or T/Sal shampoo. 2-3 times per week lather on scalp, leave on 8-10 minutes, rinse well.  Prosoria once or twice daily to scalp as needed.  Use Zoryve cream twice daily to affected areas below breasts.  Vtama samples given to be applied twice daily to affected areas below breasts.  Call for prescription of one that works the best.    Due to recent changes in healthcare laws, you may see results of your pathology and/or  laboratory studies on MyChart before the doctors have had a chance to review them. We understand that in some cases there may be results that are confusing or concerning to you. Please understand that not all results are received at the same time and often the doctors may need to interpret multiple results in order to provide you with the best plan of care or course of treatment. Therefore, we ask that you please give [place] 2 business days to thoroughly review all your results before contacting the office for clarification. Should we see a critical lab result, you will be contacted sooner.   If You Need Anything After Your Visit  If you have any questions or concerns for your doctor, please call our main line at [phone] and press option 4 to reach your doctor's medical assistant. If no one answers, please leave a voicemail as directed and we will return your call as soon as possible. Messages left after 4 pm will be answered the following business day.   You may also send [place] a message via [place]. We typically respond to MyChart messages within 1-2 business days.  For prescription refills, please ask your pharmacy to contact our office. Our fax number is [phone].  If you have an urgent issue when the clinic is closed that cannot wait until the next business day, you can page your doctor at the number below.    Please note that while we do our best to be available for urgent issues outside of office hours, we are not available 24/7.   If you have an urgent  issue and are unable to reach [place], you may choose to seek medical care at your doctor's office, retail clinic, urgent care center, or emergency room.  If you have a medical emergency, please immediately call 911 or go to the emergency department.  Pager Numbers  - Dr. Nehemiah Massed: [phone]  - Dr. Laurence Ferrari: [phone]  - Dr. Nicole Kindred: [phone]  In the event of inclement weather, please call our main line at [phone] for an  update on the status of any delays or closures.  Dermatology Medication Tips: Please keep the boxes that topical medications come in in order to help keep track of the instructions about where and how to use these. Pharmacies typically print the medication instructions only on the boxes and not directly on the medication tubes.   If your medication is too expensive, please contact our office at [phone] option 4 or send [place] a message through [place].   We are unable to tell what your co-pay for medications will be in advance as this is different depending on your insurance coverage. However, we may be able to find a substitute medication at lower cost or fill out paperwork to get insurance to cover a needed medication.   If a prior authorization is required to get your medication covered by your insurance company, please allow [place] 1-2 business days to complete this process.  Drug prices often vary depending on where the prescription is filled and some pharmacies may offer cheaper prices.  The website www.goodrx.com contains coupons for medications through different pharmacies. The prices here do not account for what the cost may be with help from insurance (it may be cheaper with your insurance), but the website can give you the price if you did not use any insurance.  - You can print the associated coupon and take it with your prescription to the pharmacy.  - You may also stop by our office during regular business hours and pick up a GoodRx coupon card.  - If you need your prescription sent electronically to a different pharmacy, notify our office through [facility] or by phone at [phone] option 4.     Si Usted Necesita Algo Despus de Su Visita  Tambin puede enviarnos un mensaje a travs de Pharmacist, community. Por lo general respondemos a los mensajes de MyChart en el transcurso de 1 a 2 das hbiles.  Para renovar recetas, por favor pida a su farmacia que se ponga en contacto con  nuestra oficina. Harland Dingwall de fax es [place] [phone].  Si tiene un asunto urgente cuando la clnica est cerrada y que no puede esperar hasta el siguiente da hbil, puede llamar/localizar a su doctor(a) al nmero que aparece a continuacin.   Por favor, tenga en cuenta que aunque hacemos todo lo posible para estar disponibles para asuntos urgentes fuera del horario de [place], no estamos disponibles las 24 horas del da, los 7 das de la [place].   Si tiene un problema urgente y no puede comunicarse con nosotros, puede optar por buscar atencin mdica  en el consultorio de su doctor(a), en una clnica privada, en un centro de atencin urgente o en una sala de emergencias.  Si tiene Engineering geologist, por favor llame inmediatamente al 911 o vaya a la sala de emergencias.  Nmeros de bper  - Dr. Nehemiah Massed: [phone]  - Dra. Moye: [phone]  - Dra. Nicole Kindred: [phone]  En caso de inclemencias del [place], por favor llame a nuestra lnea principal al [phone] para Ardelia Mems actualizacin  sobre el estado de cualquier retraso o cierre.  Consejos para la medicacin en dermatologa: Por favor, guarde las cajas en las que vienen los medicamentos de uso tpico para ayudarle a seguir las instrucciones sobre dnde y cmo usarlos. Las farmacias generalmente imprimen las instrucciones del medicamento slo en las cajas y no directamente en los tubos del [place].   Si su medicamento es muy caro, por favor, pngase en contacto con Zigmund Daniel llamando al [phone] y presione la opcin 4 o envenos un mensaje a travs de Pharmacist, community.   No podemos decirle cul ser su copago por los medicamentos por adelantado ya que esto es diferente dependiendo de la cobertura de su seguro. Sin embargo, es posible que podamos encontrar un medicamento sustituto a Electrical engineer un formulario para que el seguro cubra el medicamento que se considera necesario.   Si se requiere una autorizacin  previa para que su compaa de seguros [place] su medicamento, por favor permtanos de 1 a 2 das hbiles para completar este proceso.  Los precios de los medicamentos varan con frecuencia dependiendo del Environmental consultant de dnde se surte la receta y alguna farmacias pueden ofrecer precios ms baratos.  El sitio web www.goodrx.com tiene cupones para medicamentos de Airline pilot. Los precios aqu no tienen en cuenta lo que podra costar con la ayuda del seguro (puede ser ms barato con su seguro), pero el sitio web puede darle el precio si no utiliz Research scientist (physical sciences).  - Puede imprimir el cupn correspondiente y llevarlo con su receta a la farmacia.  - Tambin puede pasar por nuestra oficina durante el horario de atencin regular y Charity fundraiser una tarjeta de cupones de GoodRx.  - Si necesita que su receta se enve electrnicamente a una farmacia diferente, informe a nuestra oficina a travs de MyChart de [place] o por telfono llamando al [phone] y presione la opcin 4.

## 2022-11-22 NOTE — Progress Notes (Signed)
Follow-Up Visit   Subjective  Virginia Weaver is a 72 y.o. female who presents for the following: Psoriasis (Scalp, arms. Aveeno lotion is helping on arms. Tried OTC shampoo for psoriasis, dried hair out too much) and Skin Problem (Lesions under breast. Irritated by bra rubbing).  The patient has spots, moles and lesions to be evaluated, some may be new or changing and the patient has concerns that these could be cancer.   The following portions of the chart were reviewed this encounter and updated as appropriate:  Tobacco  Allergies  Meds  Problems  Med Hx  Surg Hx  Fam Hx      Review of Systems: No other skin or systemic complaints except as noted in HPI or Assessment and Plan.   Objective  Well appearing patient in no apparent distress; mood and affect are within normal limits.  A focused examination was performed including scalp, arms, chest, back. Relevant physical exam findings are noted in the Assessment and Plan.  Scalp, arms Scaly pink plaques with   inframammary Scaly pink thin plaques   Assessment & Plan  Psoriasis Scalp, arms  Vs seborrheic dermatitis  Chronic and persistent condition with duration or expected duration over one year. Condition is bothersome/symptomatic for patient. Currently flared.  Counseling on psoriasis and coordination of care  psoriasis is a chronic non-curable, but treatable genetic/hereditary disease that may have other systemic features affecting other organ systems such as joints (Psoriatic Arthritis). It is associated with an increased risk of inflammatory bowel disease, heart disease, non-alcoholic fatty liver disease, and depression.  Treatments include light and laser treatments; topical medications; and systemic medications including oral and injectables.  Has osteoarthritis. Lower back pain. Worse first thing in morning but improves quickly after starting to move. Moderate disc degeneration, per patient.  Start  Clobetasol solution once or twice daily to affected areas on scalp as needed for itching. Avoid applying to face, groin, and axilla. Use as directed. Long-term use can cause thinning of the skin.  Topical steroids (such as triamcinolone, fluocinolone, fluocinonide, mometasone, clobetasol, halobetasol, betamethasone, hydrocortisone) can cause thinning and lightening of the skin if they are used for too long in the same area. Your physician has selected the right strength medicine for your problem and area affected on the body. Please use your medication only as directed by your physician to prevent side effects.   Recommend Selsun Blue Naturals or T/Sal shampoo. 2-3 times per week lather on scalp, leave on 8-10 minutes, rinse well.   Can try Prosoria salicylic acid once or twice daily to scalp as needed.  Can use vtama or zoryve once daily, call with which works best  clobetasol (TEMOVATE) 0.05 % external solution - Scalp, arms Apply once or twice daily to affected areas on scalp as needed for itching.  Avoid applying to face, groin, and axilla.  Related Medications triamcinolone cream (KENALOG) 0.1 % USE TOPICALLY TWICE DAILY AS NEEDED  Tapinarof (VTAMA) 1 % CREA Apply 1 application topically daily.  Dermatitis inframammary  Favored over psoriasis  Use Zoryve cream twice daily to affected areas below breasts.   Vtama samples given to be applied twice daily to affected areas below breasts.   Call for prescription of one that works the best.    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 Chronic rubbing or scratching causes darkening of the skin.  -  OTC treatments which can help with itch include numbing creams like pramoxine or lidocaine which temporarily reduce itch or Capsaicin-containing creams which cause a burning sensation but which  sometimes over time will reset the nerves to stop producing itch.  - If you choose to use Capsaicin cream, it is recommended to use it 5 times daily for 1 week followed by 3 times daily for 3-6 weeks. You may have to continue using it long-term.  - For severe cases, there are some prescription cream or pill options which may help.  Recommend OTC Gold Bond Rapid Relief Anti-Itch cream (pramoxine + menthol), CeraVe Anti-itch cream or lotion (pramoxine), Sarna lotion (Original- menthol + camphor or Sensitive- pramoxine) or Eucerin 12 hour Itch Relief lotion (menthol) up to 3 times per day to areas on body that are itchy.   Return for TBSE As Scheduled.  I, Emelia Salisbury, CMA, am acting as scribe for Forest Gleason, MD.  Documentation: I have reviewed the above documentation for accuracy and completeness, and I agree with the above.  Forest Gleason, MD

## 2022-12-07 ENCOUNTER — Ambulatory Visit: Payer: BC Managed Care – PPO | Admitting: Urology

## 2022-12-07 VITALS — BP 155/63 | HR 69 | Ht 64.0 in | Wt 181.0 lb

## 2022-12-07 DIAGNOSIS — N3020 Other chronic cystitis without hematuria: Secondary | ICD-10-CM | POA: Diagnosis not present

## 2022-12-07 DIAGNOSIS — Z87448 Personal history of other diseases of urinary system: Secondary | ICD-10-CM | POA: Diagnosis not present

## 2022-12-07 DIAGNOSIS — N1330 Unspecified hydronephrosis: Secondary | ICD-10-CM

## 2022-12-07 NOTE — Progress Notes (Signed)
Haze Rushing Plume,acting as a Education administrator for [name], MD.,have documented all relevant documentation on the behalf of [name], MD,as directed by  [name], MD while in the presence of [name], MD.  12/07/2022 10:01 AM   Virginia Weaver [birth year] [id number]  Referring provider: Peggye Form, NP [place],  [place] [address]  Chief Complaint  Patient presents with   Hydronephrosis   chronic cystitis    HPI: 72 year-old female with a complex personal history of OAB, chronic cystitis, right hydropnephrosis, and microscopic hematuria who presents today for her annual follow up.   She is s/p negative bladder biopsy in 2017 that was consistent with chronic cystitis with lymphoid aggregates and mixed inflammatory infiltrates including eosinophils and plasma cells.   She underwent a cystoscopy on 09/29/2021 that showed left interior lateral bladder wall erythema, inflammation, and a small area of ulceration.Some bleeding of area with manipulation of the scope, no evert papillary tumor in the bladder   CT hematuria workup on 10/12/2021 visualized mild right hydronephrosis with abrupt transition from distended right renal pelvis to nondistended proximal right ureter. No stone at this location no soft tissue mass lesion evident. While imaging features suggest component of UPJ obstruction, no right-sided hydronephrosis.    She is s/p cystoscopy with bilateral retrograde pyelogram, diagnostic right ureteroscopy, targeted bladder biopsy and fulguration on 11/04/2021. Intraoperative findings showed patchy erythema of the left lateral bladder wall with area of ulceration seen on cystoscopy as well as some erythema on the right lateral bladder wall.  Moderate right hydronephrosis on retrograde pyelogram with an abrupt transition at the UPJ with somewhat high insertion fairly pathognomonic for UPJ obstruction.  Diagnostic ureteroscopy negative other than some  mild narrowing at the UPJ with almost scarlike appearance.  Easily traversable with 7 [place] digital flexible scope.  Slightly sluggish drainage of contrast material on the side.  Targeted bladder biopsies to left and right lateral bladder walls including fulguration.  Diffuse hemorrhage / weeping of bladder mucosa noted with bladder distention.  She also had a Lasix renal study in light of her right-sided hydronephrosis, which at the time was interoperatively concerning for a mild UPJ obstruction, which was unremarkable with excellent drainage of both of her kidneys and a minimally dilated right-sided collecting system.   She had a more recent CT on 11/07/2022, which I am personally reviewing in light of a recent episode of abdominal pain that mentions asymmetric bladder wall thickening left greater than right.   Today, she reports urgency, frequency, and painful urination.   Results for orders placed or performed during the hospital encounter of 11/07/22  Lipase, blood  Result Value Ref Range   Lipase 48 11 - 51 U/L  Comprehensive metabolic panel  Result Value Ref Range   Sodium 137 135 - 145 mmol/L   Potassium 3.6 3.5 - 5.1 mmol/L   Chloride 102 98 - 111 mmol/L   CO2 28 22 - 32 mmol/L   Glucose, Bld 236 (H) 70 - 99 mg/dL   BUN 17 8 - 23 mg/dL   Creatinine, Ser 0.67 0.44 - 1.00 mg/dL   Calcium 9.0 8.9 - 10.3 mg/dL   Total Protein 6.6 6.5 - 8.1 g/dL   Albumin 4.1 3.5 - 5.0 g/dL   AST 25 15 - 41 U/L   ALT 15 0 - 44 U/L   Alkaline Phosphatase 47 38 - 126 U/L   Total Bilirubin 0.7 0.3 - 1.2 mg/dL   GFR, Estimated >  60 >60 mL/min   Anion gap 7 5 - 15  CBC  Result Value Ref Range   WBC 7.0 4.0 - 10.5 K/uL   RBC 4.42 3.87 - 5.11 MIL/uL   Hemoglobin 13.3 12.0 - 15.0 g/dL   HCT 40.2 36.0 - 46.0 %   MCV 91.0 80.0 - 100.0 fL   MCH 30.1 26.0 - 34.0 pg   MCHC 33.1 30.0 - 36.0 g/dL   RDW 13.0 11.5 - 15.5 %   Platelets 189 150 - 400 K/uL   nRBC 0.0 0.0 - 0.2 %  Urinalysis, Routine w reflex  microscopic  Result Value Ref Range   Color, Urine YELLOW (A) YELLOW   APPearance CLEAR (A) CLEAR   Specific Gravity, Urine 1.020 1.005 - 1.030   pH 6.0 5.0 - 8.0   Glucose, UA >=500 (A) NEGATIVE mg/dL   Hgb urine dipstick NEGATIVE NEGATIVE   Bilirubin Urine NEGATIVE NEGATIVE   Ketones, ur NEGATIVE NEGATIVE mg/dL   Protein, ur NEGATIVE NEGATIVE mg/dL   Nitrite NEGATIVE NEGATIVE   Leukocytes,Ua NEGATIVE NEGATIVE   RBC / HPF 0-5 0 - 5 RBC/hpf   WBC, UA 0-5 0 - 5 WBC/hpf   Bacteria, UA NONE SEEN NONE SEEN   Squamous Epithelial / HPF 0-5 0 - 5 /HPF   Mucus PRESENT   Troponin I (High Sensitivity)  Result Value Ref Range   Troponin I (High Sensitivity) 4 <18 ng/L     PMH: Past Medical History:  Diagnosis Date   Acid reflux 04/02/2001   Adiposity 04/20/1993   Anemia    h/o   Arthritis    Benign essential HTN 11/11/1993   Carotid atherosclerosis, bilateral    COVID-19 08/2021   Diabetes mellitus without complication (HCC)    diet    Heart murmur    Hypertension    RSV (respiratory syncytial virus infection) 09/2021   Syncope and collapse 01/20/2009   Tinnitus    left ear   Urticaria 06/04/2015    Surgical History: Past Surgical History:  Procedure Laterality Date   CYSTOSCOPY W/ RETROGRADES Bilateral 11/04/2021   Procedure: CYSTOSCOPY WITH RETROGRADE PYELOGRAM;  Surgeon: [name], MD;  Location: [facility];  Service: Urology;  Laterality: Bilateral;   CYSTOSCOPY WITH BIOPSY N/A 12/22/2015   Procedure: CYSTOSCOPY WITH BIOPSY;  Surgeon: [name], MD;  Location: [facility];  Service: Urology;  Laterality: N/A;   CYSTOSCOPY WITH BIOPSY N/A 11/04/2021   Procedure: CYSTOSCOPY WITH BLADDER  BIOPSY;  Surgeon: [name], MD;  Location: [facility];  Service: Urology;  Laterality: N/A;   CYSTOSCOPY WITH FULGERATION N/A 11/04/2021   Procedure: CYSTOSCOPY WITH FULGERATION;  Surgeon: [name], MD;  Location: [facility];  Service: Urology;  Laterality: N/A;   TUBAL  LIGATION  1987   TUMMY TUCK  06/17/2010   URETEROSCOPY Right 11/04/2021   Procedure: DIAGNOSTIC URETEROSCOPY;  Surgeon: [name], MD;  Location: [facility];  Service: Urology;  Laterality: Right;    Home Medications:  Allergies as of 12/07/2022       Reactions   Ibuprofen    Other reaction(s): Abdominal Pain Can't take NSAIDS secondary to erosions due to NSAIDS.   Nsaids    Can't take NSAIDS secondary to erosions due to NSAIDS.        Medication List        Accurate as of December 07, 2022 10:01 AM. If you have any questions, ask your nurse or doctor.          ALPRAZolam  0.5 MG tablet Commonly known as: XANAX Take 1 tablet (0.5 mg total) by mouth daily as needed for up to 45 doses for anxiety (may repeat dose if needed; max of 15 times/month). What changed: when to take this   calcium carbonate 600 MG Tabs tablet Commonly known as: OS-CAL Take 1 tablet by mouth 2 (two) times daily with a meal.   Cholecalciferol 25 MCG (1000 UT) capsule Take 1,000 Units by mouth daily.   Cinnamon 500 MG capsule Take 1,000 mg by mouth 2 (two) times daily.   clobetasol 0.05 % external solution Commonly known as: TEMOVATE Apply once or twice daily to affected areas on scalp as needed for itching.  Avoid applying to face, groin, and axilla.   CoQ10 200 MG Caps Take 1 capsule by mouth daily.   dicyclomine 20 MG tablet Commonly known as: BENTYL Take 1 tablet (20 mg total) by mouth every 8 (eight) hours as needed for spasms (Abdominal cramping).   docusate sodium 100 MG capsule Commonly known as: COLACE Take 100 mg by mouth daily.   ESTER C PO Take 1,000 mg by mouth daily.   Fish Oil 500 MG Caps Take 500 mg by mouth daily.   fluorouracil 5 % cream Commonly known as: EFUDEX Apply to affected area on nose twice a day x 4 days   gabapentin 100 MG capsule Commonly known as: NEURONTIN TAKE 1 CAPSULE EVERY MORNING  AND TAKE 2 CAPSULES AT BEDTIME   Ginkgo Biloba 50 MG  Caps Take 50 mg by mouth daily.   Hair/Skin/Nails Tabs Take 1 tablet by mouth in the morning and at bedtime.   HYDROcodone-acetaminophen 5-325 MG tablet Commonly known as: NORCO/VICODIN Take 1-2 tablets by mouth every 6 (six) hours as needed for moderate pain.   hydrocortisone 25 MG suppository Commonly known as: ANUSOL-HC UNWRAP AND INSERT 1 SUPPOSITORY RECALLY TWICE A DAY   levocetirizine 5 MG tablet Commonly known as: XYZAL TAKE 1 TABLET BY MOUTH DAILY What changed:  how much to take how to take this when to take this   metFORMIN 500 MG tablet Commonly known as: GLUCOPHAGE TAKE ONE TABLET BY MOUTH TWICE DAILY WITH A MEAL   montelukast 10 MG tablet Commonly known as: SINGULAIR TAKE 1 TABLET BY MOUTH DAILY   ondansetron 4 MG disintegrating tablet Commonly known as: ZOFRAN-ODT Take 1 tablet (4 mg total) by mouth every 6 (six) hours as needed for nausea or vomiting.   pantoprazole 40 MG tablet Commonly known as: Protonix Take 1 tablet (40 mg total) by mouth daily.   pravastatin 40 MG tablet Commonly known as: PRAVACHOL Take 40 mg by mouth at bedtime.   PREVAGEN PO Take by mouth.   PROBIOTIC ACIDOPHILUS PO Take 1 capsule by mouth daily.   ramipril 5 MG capsule Commonly known as: ALTACE Take 5 mg by mouth at bedtime.   SUPER B COMPLEX & C Tabs Take 1 tablet by mouth daily.   triamcinolone cream 0.1 % Commonly known as: KENALOG USE TOPICALLY TWICE DAILY AS NEEDED   UNABLE TO FIND Total Beets   Vtama 1 % Crea Generic drug: Tapinarof Apply 1 application topically daily.        Allergies:  Allergies  Allergen Reactions   Ibuprofen     Other reaction(s): Abdominal Pain Can't take NSAIDS secondary to erosions due to NSAIDS.   Nsaids     Can't take NSAIDS secondary to erosions due to NSAIDS.    Family History: Family History  Problem Relation  Age of Onset   Heart attack Mother    Stroke Mother    Diabetes Mother    Throat cancer Mother     Heart attack Father    Hypertension Father    Glaucoma Father    AAA (abdominal aortic aneurysm) Father    Obesity Sister    Throat cancer Maternal Grandmother    Heart attack Maternal Grandfather    CVA Paternal Grandmother    Congestive Heart Failure Paternal Grandmother    Heart attack Paternal Grandfather    Breast cancer Cousin     Social History:  reports that she has never smoked. She has never used smokeless tobacco. She reports that she does not drink alcohol and does not use drugs.   Physical Exam: BP (!) 155/63   Pulse 69   Ht 5' 4"$  (1.626 m)   Wt 181 lb (82.1 kg)   BMI 31.07 kg/m   Constitutional:  Alert and oriented, No acute distress. HEENT:  AT, moist mucus membranes.  Trachea midline, no masses. Neurologic: Grossly intact, no focal deficits, moving all 4 extremities. Psychiatric: Normal mood and affect.  Pertinent Imaging:  CLINICAL DATA:  Abdominal pain.   EXAM: CT ABDOMEN AND PELVIS WITH CONTRAST   TECHNIQUE: Multidetector CT imaging of the abdomen and pelvis was performed using the standard protocol following bolus administration of intravenous contrast.   RADIATION DOSE REDUCTION: This exam was performed according to the departmental dose-optimization program which includes automated exposure control, adjustment of the mA and/or kV according to patient size and/or use of iterative reconstruction technique.   CONTRAST:  168m OMNIPAQUE IOHEXOL 300 MG/ML  SOLN   COMPARISON:  CT with IV contrast 09/24/2021, 11/26/2015.   FINDINGS: Lower chest: There is mild thickening of the distal thoracic esophagus but no more than previously. Probable chronic esophagitis. Endoscopy may be indicated but this seems unchanged.   There are calcifications in the coronary arteries and mitral ring. The cardiac size is normal. There is no pericardial effusion.   Above the plane of the prior studies there is a 6 mm subpleural solid right middle lobe nodule  anteriorly on 4:12.   There is a 3 mm stable right middle lobe nodule on 4:18. There is linear scarring in the base of the right middle lobe.   Remaining lung bases are clear.   Hepatobiliary: No focal liver abnormality is seen. No calcified gallstones, gallbladder wall thickening, or biliary dilatation.   Pancreas: No abnormality.   Spleen: No abnormality.   Adrenals/Urinary Tract: Adrenal glands are unremarkable. Kidneys are normal, without renal calculi, focal lesion, or hydronephrosis. There is mild bladder thickening versus underdistention. The bladder wall is asymmetrically thicker to the left and cystoscopy is recommended.   Correlate clinically for cystitis and/or hematuria. Symmetric renal excretion is noted in the delayed phase.   Stomach/Bowel: There are antropyloric gastric thickened folds. Query gastritis or peptic ulcer disease.   The unopacified small bowel bowel is normal caliber. There are thickened folds in the jejunum and portions of the ileum but no inflammatory changes. The appendix is normal. The large bowel wall normal in thickness with uncomplicated left-sided diverticula.   Vascular/Lymphatic: Aortic atherosclerosis. No enlarged abdominal or pelvic lymph nodes.   Reproductive: Uterus and bilateral adnexa are unremarkable.   Other: Surgical clips are again noted scattered over the abdominal wall, a few in the right groin area. There is no free air, free fluid, free hemorrhage or incarcerated hernia. There are calcified injection granulomas in both buttocks.  Musculoskeletal: Advanced facet hypertrophy L4-5 with grade 1 spondylolisthesis. Mild lumbar spondylosis. Advanced degenerative disc collapse L5-S1. No acute or significant osseous findings or suspicious lesions.   IMPRESSION: 1. Thickened folds in the antropyloric stomach, jejunum and portions of the ileum. Query gastritis or peptic ulcer disease, nonspecific enteritis. No bowel  obstruction or inflammatory changes. 2. Diverticulosis without evidence of diverticulitis. 3. Bladder thickening versus underdistention. The bladder wall asymmetrically thicker to the left. Correlate clinically for cystitis and/or hematuria. Follow-up cystoscopy recommended. 4. Aortic and coronary artery atherosclerosis. 5. 6 mm subpleural right middle lobe nodule. Non-contrast chest CT at 6-12 months is recommended. If the nodule is stable at time of repeat CT, then future CT at 18-24 months (from today's scan) is considered optional for low-risk patients, but is recommended for high-risk patients. This recommendation follows the consensus statement: Guidelines for Management of Incidental Pulmonary Nodules Detected on CT Images: From the Fleischner Society 2017; Radiology 2017; 284:228-243. 6. Probable chronic esophagitis. Endoscopy may be indicated but this seems unchanged from the prior study. 7. Degenerative changes of the spine. 8. Additional chronic findings described above.   Aortic Atherosclerosis (ICD10-I70.0).     Electronically Signed   By: Telford Nab M.D.   On: 11/07/2022 06:19  This was personally reviewed and I agree with the radiologic interpretation.  Assessment & Plan:    1. Right-sided hydronephrosis - Not appreciated on most recent study - Lasix renal scan was reassuring and she had a negative diagnostic ureteroscopy. - No further workup for this.   2. Chronic cystitis - Multiple biopsies indicate this is an inflammatory condition without malignancy or atypia.  - She does have asymmetric bladder wall thickening but this is a known and is consistent with her previous cystoscopies and biopsies.  - We will not pursue any additional workup for this given that this has been evaluated extensively in the past.  - For chronic OAB, cystitis, use AZO as needed.   Return in about 1 year (around 12/08/2023) for repeat UA.  I have reviewed the above documentation  for accuracy and completeness, and I agree with the above.   [name], MD   [facility] [address], [place], [place] [address] [phone]

## 2022-12-08 LAB — URINALYSIS, COMPLETE
Bilirubin, UA: NEGATIVE
Glucose, UA: NEGATIVE
Ketones, UA: NEGATIVE
Leukocytes,UA: NEGATIVE
Nitrite, UA: NEGATIVE
Protein,UA: NEGATIVE
Specific Gravity, UA: 1.02 (ref 1.005–1.030)
Urobilinogen, Ur: 0.2 mg/dL (ref 0.2–1.0)
pH, UA: 7 (ref 5.0–7.5)

## 2022-12-08 LAB — MICROSCOPIC EXAMINATION

## 2023-01-30 ENCOUNTER — Ambulatory Visit: Payer: BC Managed Care – PPO | Admitting: Dermatology

## 2023-02-09 ENCOUNTER — Ambulatory Visit: Payer: BC Managed Care – PPO | Admitting: Dermatology

## 2023-02-20 ENCOUNTER — Ambulatory Visit
Admission: RE | Admit: 2023-02-20 | Discharge: 2023-02-20 | Disposition: A | Discharge status: Home / Self Care | Payer: BC Managed Care – PPO | Source: Ambulatory Visit | Attending: Family Medicine | Admitting: Family Medicine

## 2023-02-20 DIAGNOSIS — Z1231 Encounter for screening mammogram for malignant neoplasm of breast: Secondary | ICD-10-CM | POA: Diagnosis present

## 2023-03-04 LAB — COLOGUARD: COLOGUARD: NEGATIVE

## 2023-04-13 ENCOUNTER — Other Ambulatory Visit: Payer: Self-pay | Admitting: Family Medicine

## 2023-04-13 DIAGNOSIS — F411 Generalized anxiety disorder: Secondary | ICD-10-CM

## 2023-04-13 NOTE — Telephone Encounter (Signed)
Requested medication (s) are due for refill today: yes  Requested medication (s) are on the active medication list: yes    Last refill: 10/08/21  #45  0 refills  Future visit scheduled   Notes to clinic: Could not verify if still pt here. Please review.  Requested Prescriptions  Pending Prescriptions Disp Refills   ALPRAZolam (XANAX) 0.5 MG tablet [Pharmacy Med Name: ALPRAZOLAM 0.5 MG TAB] 45 tablet     Sig: TAKE 1 TABLET BY MOUTH DAILY AS NEEDED FOR UP TO 45 DOSES FOR ANXIETY. **MAY REPEAT DOSE IF NEEDED, MAX OF 15 TIMES PER MONTH**     Not Delegated - Psychiatry: Anxiolytics/Hypnotics 2 Failed - 04/13/2023  1:34 PM      Failed - This refill cannot be delegated      Failed - Urine Drug Screen completed in last 360 days      Failed - Valid encounter within last 6 months    Recent Outpatient Visits           1 year ago Diabetes mellitus without complication [facility])   [facility] [place], Jodell Cipro, PA-C   2 years ago Acute non-recurrent pansinusitis   [facility] [place], Al[place]andra [place], [place]   2 years ago Blister   [facility] [place], [place], [place]   2 years ago Low back pain, unspecified back pain laterality, unspecified chronicity, unspecified whether sciatica present   [facility] [place], [place], [place]   3 years ago Routine general medical examination at a health care facility   [facility] [place], [place], [place]       Future Appointments             In 7 months [name], MD [facility] - Patient is not pregnant

## 2023-05-04 ENCOUNTER — Other Ambulatory Visit: Payer: Self-pay | Admitting: Specialist

## 2023-05-04 DIAGNOSIS — R918 Other nonspecific abnormal finding of lung field: Secondary | ICD-10-CM

## 2023-05-11 ENCOUNTER — Ambulatory Visit
Admission: RE | Admit: 2023-05-11 | Discharge: 2023-05-11 | Disposition: A | Discharge status: Home / Self Care | Payer: BC Managed Care – PPO | Source: Ambulatory Visit | Attending: Specialist | Admitting: Specialist

## 2023-05-11 DIAGNOSIS — R918 Other nonspecific abnormal finding of lung field: Secondary | ICD-10-CM

## 2023-05-30 ENCOUNTER — Encounter: Payer: Self-pay | Admitting: *Deleted

## 2023-05-30 ENCOUNTER — Emergency Department
Admission: EM | Admit: 2023-05-30 | Discharge: 2023-05-30 | Disposition: A | Discharge status: Home / Self Care | Payer: BC Managed Care – PPO | Attending: Student in an Organized Health Care Education/Training Program | Admitting: Student in an Organized Health Care Education/Training Program

## 2023-05-30 DIAGNOSIS — N3001 Acute cystitis with hematuria: Secondary | ICD-10-CM

## 2023-05-30 DIAGNOSIS — R319 Hematuria, unspecified: Secondary | ICD-10-CM | POA: Diagnosis present

## 2023-05-30 LAB — URINALYSIS, ROUTINE W REFLEX MICROSCOPIC
Bilirubin Urine: NEGATIVE
Glucose, UA: NEGATIVE mg/dL
Ketones, ur: NEGATIVE mg/dL
Leukocytes,Ua: NEGATIVE
Nitrite: POSITIVE — AB
Protein, ur: 30 mg/dL — AB
RBC / HPF: >50 RBC/hpf (ref 0–5)
Specific Gravity, Urine: 1.005 (ref 1.005–1.030)
pH: 6 (ref 5.0–8.0)

## 2023-05-30 MED ORDER — CEFTRIAXONE SODIUM 1 G IJ SOLR
1.0000 g | Freq: Once | INTRAMUSCULAR | Status: AC
  Administered 2023-05-30: 1 g via INTRAMUSCULAR
  Filled 2023-05-30: qty 10

## 2023-05-30 MED ORDER — LIDOCAINE HCL (PF) 1 % IJ SOLN
INTRAMUSCULAR | Status: AC
  Administered 2023-05-30: 1.2 mL
  Filled 2023-05-30: qty 5

## 2023-05-30 MED ORDER — CEFDINIR 300 MG PO CAPS: 300.0000 mg | ORAL_CAPSULE | Freq: Two times a day (BID) | ORAL | 0 refills | Status: AC

## 2023-05-30 NOTE — ED Notes (Signed)
Pt states she also took AZO tonight after her symptoms started

## 2023-05-30 NOTE — ED Triage Notes (Addendum)
Pt reports "light red" blood in her urine around 1800 tonight reports she feels like she was having urethral burning/spasms. Denies fevers/flank pain or abdominal pain.

## 2023-05-30 NOTE — ED Provider Notes (Signed)
[facility] Provider Note  Patient Contact: 10:30 PM (approximate)   History   Hematuria   HPI  Virginia Weaver is a 72 y.o. female who presents to the emergency department complaining of burning with urination, slight hematuria.  Patient has a history of interstitial cystitis, follows with urology for same.  She states that she was concerned she may have a UTI.  There is no flank pain.  No history of kidney stones.  Patient states that this feels like a UTI.  No fevers, chills, nausea, vomiting, diarrhea or constipation.     Physical Exam   Triage Vital Signs: ED Triage Vitals  Encounter Vitals Group     BP 05/30/23 2107 (!) 173/84     Systolic BP Percentile --      Diastolic BP Percentile --      Pulse Rate 05/30/23 2107 75     Resp 05/30/23 2107 17     Temp 05/30/23 2107 98.2 F (36.8 C)     Temp src --      SpO2 05/30/23 2107 96 %     Weight --      Height --      Head Circumference --      Peak Flow --      Pain Score 05/30/23 2105 5     Pain Loc --      Pain Education --      Exclude from Growth Chart --     Most recent vital signs: Vitals:   05/30/23 2107  BP: (!) 173/84  Pulse: 75  Resp: 17  Temp: 98.2 F (36.8 C)  SpO2: 96%     General: Alert and in no acute distress.  Cardiovascular:  Good peripheral perfusion Respiratory: Normal respiratory effort without tachypnea or retractions. Lungs CTAB.  Gastrointestinal: Bowel sounds 4 quadrants. Soft and nontender to palpation. No guarding or rigidity. No palpable masses. No distention. No CVA tenderness. Musculoskeletal: Full range of motion to all extremities.  Neurologic:  No gross focal neurologic deficits are appreciated.  Skin:   No rash noted Other:   ED Results / Procedures / Treatments   Labs (all labs ordered are listed, but only abnormal results are displayed) Labs Reviewed  URINALYSIS, ROUTINE W REFLEX MICROSCOPIC - Abnormal; Notable for the following  components:      Result Value   Color, Urine AMBER (*)    APPearance CLEAR (*)    Hgb urine dipstick LARGE (*)    Protein, ur 30 (*)    Nitrite POSITIVE (*)    Bacteria, UA RARE (*)    All other components within normal limits     EKG     RADIOLOGY    No results found.  PROCEDURES:  Critical Care performed: No  Procedures   MEDICATIONS ORDERED IN ED: Medications  cefTRIAXone (ROCEPHIN) injection 1 g (has no administration in time range)     IMPRESSION / MDM / ASSESSMENT AND PLAN / ED COURSE  I reviewed the triage vital signs and the nursing notes.                                 Differential diagnosis includes, but is not limited to, UTI, pyelonephritis, nephrolithiasis, interstitial cystitis   Patient's presentation is most consistent with acute presentation with potential threat to life or bodily function.   Patient's diagnosis is consistent with UTI.  Patient presents emergency department  with burning, frequency, small amount of hematuria.  Patient has no fevers, chills, flank pain.  No history of kidney stones.  Low suspicion at this time for any kidney stone or pyelonephritis.  Patient's urine is concerning for UTI and patient will be treated with Rocephin and Omnicef.  At this time recommend follow-up with urology as needed.  Return precautions discussed with the patient..  Patient is given ED precautions to return to the ED for any worsening or new symptoms.     FINAL CLINICAL IMPRESSION(S) / ED DIAGNOSES   Final diagnoses:  Acute cystitis with hematuria     Rx / DC Orders   ED Discharge Orders          Ordered    cefdinir (OMNICEF) 300 MG capsule  2 times daily        05/30/23 2233             Note:  This document was prepared using Dragon voice recognition software and may include unintentional dictation errors.   Lanette Hampshire 05/30/23 2233    Willy Eddy, MD 05/30/23 [phone]

## 2023-06-01 ENCOUNTER — Encounter: Payer: Self-pay | Admitting: Urology

## 2023-06-01 NOTE — Telephone Encounter (Signed)
Spoke with patient, patient called after hours nurse on 8/6 because symptoms started after work and made her feel sick really quick. Patient would like to know if she should follow up with someone or a lab visit for a culture when she is done with the medication to make sure she was treated appropriately?

## 2023-08-29 ENCOUNTER — Ambulatory Visit: Payer: BC Managed Care – PPO | Admitting: Dermatology

## 2023-08-29 ENCOUNTER — Encounter: Payer: Self-pay | Admitting: Dermatology

## 2023-08-29 DIAGNOSIS — L578 Other skin changes due to chronic exposure to nonionizing radiation: Secondary | ICD-10-CM

## 2023-08-29 DIAGNOSIS — Z1283 Encounter for screening for malignant neoplasm of skin: Secondary | ICD-10-CM | POA: Diagnosis not present

## 2023-08-29 DIAGNOSIS — Z79899 Other long term (current) drug therapy: Secondary | ICD-10-CM

## 2023-08-29 DIAGNOSIS — D1801 Hemangioma of skin and subcutaneous tissue: Secondary | ICD-10-CM

## 2023-08-29 DIAGNOSIS — W908XXA Exposure to other nonionizing radiation, initial encounter: Secondary | ICD-10-CM

## 2023-08-29 DIAGNOSIS — L409 Psoriasis, unspecified: Secondary | ICD-10-CM | POA: Diagnosis not present

## 2023-08-29 DIAGNOSIS — L814 Other melanin hyperpigmentation: Secondary | ICD-10-CM

## 2023-08-29 DIAGNOSIS — L821 Other seborrheic keratosis: Secondary | ICD-10-CM

## 2023-08-29 DIAGNOSIS — D229 Melanocytic nevi, unspecified: Secondary | ICD-10-CM

## 2023-08-29 MED ORDER — SKYRIZI PEN 150 MG/ML ~~LOC~~ SOAJ
150.0000 mg | SUBCUTANEOUS | 1 refills | Status: DC
Start: 2023-08-29 — End: 2023-09-13

## 2023-08-29 MED ORDER — CLOBETASOL PROPIONATE 0.05 % EX SOLN: CUTANEOUS | 3 refills | Status: DC

## 2023-08-29 NOTE — Progress Notes (Signed)
Follow-Up Visit   Subjective  Virginia Weaver is a 72 y.o. female who presents for the following: Skin Cancer Screening and Full Body Skin Exam. No personal Hx of skin cancer or dysplastic nevi.   Recheck psoriasis on scalp. Thinks she may have used Clobetasol solution for a month, did not help. Used [employer], did not notice any improvement. OTC shampoos do not help either. States she has had psoriasis   The patient presents for Total-Body Skin Exam (TBSE) for skin cancer screening and mole check. The patient has spots, moles and lesions to be evaluated, some may be new or changing and the patient may have concern these could be cancer.    The following portions of the chart were reviewed this encounter and updated as appropriate: medications, allergies, medical history  Review of Systems:  No other skin or systemic complaints except as noted in HPI or Assessment and Plan.  Objective  Well appearing patient in no apparent distress; mood and affect are within normal limits.  A full examination was performed including scalp, head, eyes, ears, nose, lips, neck, chest, axillae, abdomen, back, buttocks, bilateral upper extremities, bilateral lower extremities, hands, feet, fingers, toes, fingernails, and toenails. All findings within normal limits unless otherwise noted below.   Relevant physical exam findings are noted in the Assessment and Plan.    Assessment & Plan   SKIN CANCER SCREENING PERFORMED TODAY.  ACTINIC DAMAGE - Chronic condition, secondary to cumulative UV/sun exposure - diffuse scaly erythematous macules with underlying dyspigmentation - Recommend daily broad spectrum sunscreen SPF 30+ to sun-exposed areas, reapply every 2 hours as needed.  - Staying in the shade or wearing long sleeves, sun glasses (UVA+UVB protection) and wide brim hats (4-inch brim around the entire circumference of the hat) are also recommended for sun protection.  - Call for new or  changing lesions.  LENTIGINES, SEBORRHEIC KERATOSES, HEMANGIOMAS - Benign normal skin lesions - Benign-appearing - Call for any changes  MELANOCYTIC NEVI - Tan-brown and/or pink-flesh-colored symmetric macules and papules - Benign appearing on exam today - Observation - Call clinic for new or changing moles - Recommend daily use of broad spectrum spf 30+ sunscreen to sun-exposed areas.    PSORIASIS, present for > 50 years Failed clobetasol Exam: Well-demarcated erythematous papules/plaques with silvery scale, on scalp and upper arms, buttocks. Erythematous plaque at inframammary.  5% BSA.  Chronic and persistent condition with duration or expected duration over one year. Condition is symptomatic/ bothersome to patient. Not currently at goal.  Has osteoarthritis. Lower back pain. Worse first thing in morning but improves quickly after starting to move. Moderate disc degeneration, per patient.   Psoriasis is a chronic non-curable, but treatable genetic/hereditary disease that may have other systemic features affecting other organ systems such as joints (Psoriatic Arthritis). It is associated with an increased risk of inflammatory bowel disease, heart disease, non-alcoholic fatty liver disease, and depression.  Treatments include light and laser treatments; topical medications; and systemic medications including oral and injectables.  No personal Hx if IBS, IBD, ulcerative colitis or crohn's disease per patient.   Treatment Plan:   Start Clobetasol solution twice daily to affected areas on scalp until smooth or as needed for itching.   Plan to start [facility] pending insurance approval and negative TB test. 150 mg at week 0 4 16 and every 12 afterwards  Topical steroids (such as triamcinolone, fluocinolone, fluocinonide, mometasone, clobetasol, halobetasol, betamethasone, hydrocortisone) can cause thinning and lightening of the skin if  they are used for too long in the same area. Your  physician has selected the right strength medicine for your problem and area affected on the body. Please use your medication only as directed by your physician to prevent side effects.    Reviewed risks of biologics including immunosuppression, infections, injection site reaction, and failure to improve condition. Goal is control of skin condition, not cure.  Some older biologics such as Humira and Enbrel may slightly increase risk of malignancy and may worsen congestive heart failure.  Taltz and Cosentyx may cause inflammatory bowel disease to flare. The use of biologics requires long term medication management, including periodic office visits and monitoring of blood work.  Psoriasis  Related Procedures QuantiFERON-TB Gold Plus  Related Medications triamcinolone cream (KENALOG) 0.1 % USE TOPICALLY TWICE DAILY AS NEEDED  Tapinarof (VTAMA) 1 % CREA Apply 1 application topically daily.  Encounter for long-term (current) use of high-risk medication  Related Procedures QuantiFERON-TB Gold Plus   Return in about 1 year (around 08/28/2024) for TBSE.  I, Lawson Radar, CMA, am acting as scribe for Elie Goody, MD.   Documentation: I have reviewed the above documentation for accuracy and completeness, and I agree with the above.  Elie Goody, MD

## 2023-08-29 NOTE — Patient Instructions (Signed)
Start Clobetasol solution twice daily to affected areas on scalp until smooth or as needed for itching.   Plan to start [facility] pending insurance approval and negative TB test.   Topical steroids (such as triamcinolone, fluocinolone, fluocinonide, mometasone, clobetasol, halobetasol, betamethasone, hydrocortisone) can cause thinning and lightening of the skin if they are used for too long in the same area. Your physician has selected the right strength medicine for your problem and area affected on the body. Please use your medication only as directed by your physician to prevent side effects.    Reviewed risks of biologics including immunosuppression, infections, injection site reaction, and failure to improve condition. Goal is control of skin condition, not cure.  Some older biologics such as Humira and Enbrel may slightly increase risk of malignancy and may worsen congestive heart failure.  Taltz and Cosentyx may cause inflammatory bowel disease to flare. The use of biologics requires long term medication management, including periodic office visits and monitoring of blood work.    Recommend daily broad spectrum sunscreen SPF 30+ to sun-exposed areas, reapply every 2 hours as needed. Call for new or changing lesions.  Staying in the shade or wearing long sleeves, sun glasses (UVA+UVB protection) and wide brim hats (4-inch brim around the entire circumference of the hat) are also recommended for sun protection.     Melanoma ABCDEs  Melanoma is the most dangerous type of skin cancer, and is the leading cause of death from skin disease.  You are more likely to develop melanoma if you: Have light-colored skin, light-colored eyes, or red or blond hair Spend a lot of time in the sun Tan regularly, either outdoors or in a tanning bed Have had blistering sunburns, especially during childhood Have a close family member who has had a melanoma Have atypical moles or large birthmarks  Early  detection of melanoma is key since treatment is typically straightforward and cure rates are extremely high if we catch it early.   The first sign of melanoma is often a change in a mole or a new dark spot.  The ABCDE system is a way of remembering the signs of melanoma.  A for asymmetry:  The two halves do not match. B for border:  The edges of the growth are irregular. C for color:  A mixture of colors are present instead of an even brown color. D for diameter:  Melanomas are usually (but not always) greater than 6mm - the size of a pencil eraser. E for evolution:  The spot keeps changing in size, shape, and color.  Please check your skin once per month between visits. You can use a small mirror in front and a large mirror behind you to keep an eye on the back side or your body.   If you see any new or changing lesions before your next follow-up, please call to schedule a visit.  Please continue daily skin protection including broad spectrum sunscreen SPF 30+ to sun-exposed areas, reapplying every 2 hours as needed when you're outdoors.   Staying in the shade or wearing long sleeves, sun glasses (UVA+UVB protection) and wide brim hats (4-inch brim around the entire circumference of the hat) are also recommended for sun protection.      Due to recent changes in healthcare laws, you may see results of your pathology and/or laboratory studies on MyChart before the doctors have had a chance to review them. We understand that in some cases there may be results that are confusing or  concerning to you. Please understand that not all results are received at the same time and often the doctors may need to interpret multiple results in order to provide you with the best plan of care or course of treatment. Therefore, we ask that you please give [place] 2 business days to thoroughly review all your results before contacting the office for clarification. Should we see a critical lab result, you will be  contacted sooner.   If You Need Anything After Your Visit  If you have any questions or concerns for your doctor, please call our main line at [phone] and press option 4 to reach your doctor's medical assistant. If no one answers, please leave a voicemail as directed and we will return your call as soon as possible. Messages left after 4 pm will be answered the following business day.   You may also send [place] a message via MyChart. We typically respond to MyChart messages within 1-2 business days.  For prescription refills, please ask your pharmacy to contact our office. Our fax number is [phone].  If you have an urgent issue when the clinic is closed that cannot wait until the next business day, you can page your doctor at the number below.    Please note that while we do our best to be available for urgent issues outside of office hours, we are not available 24/7.   If you have an urgent issue and are unable to reach [place], you may choose to seek medical care at your doctor's office, retail clinic, urgent care center, or emergency room.  If you have a medical emergency, please immediately call 911 or go to the emergency department.  Pager Numbers  - Dr. Gwen Pounds: [phone]  - Dr. Roseanne Reno: [phone]  - Dr. Katrinka Blazing: [phone]   In the event of inclement weather, please call our main line at [phone] for an update on the status of any delays or closures.  Dermatology Medication Tips: Please keep the boxes that topical medications come in in order to help keep track of the instructions about where and how to use these. Pharmacies typically print the medication instructions only on the boxes and not directly on the medication tubes.   If your medication is too expensive, please contact our office at [phone] option 4 or send [place] a message through MyChart.   We are unable to tell what your co-pay for medications will be in advance as this is different depending on  your insurance coverage. However, we may be able to find a substitute medication at lower cost or fill out paperwork to get insurance to cover a needed medication.   If a prior authorization is required to get your medication covered by your insurance company, please allow [place] 1-2 business days to complete this process.  Drug prices often vary depending on where the prescription is filled and some pharmacies may offer cheaper prices.  The website www.goodrx.com contains coupons for medications through different pharmacies. The prices here do not account for what the cost may be with help from insurance (it may be cheaper with your insurance), but the website can give you the price if you did not use any insurance.  - You can print the associated coupon and take it with your prescription to the pharmacy.  - You may also stop by our office during regular business hours and pick up a GoodRx coupon card.  - If you need your prescription sent electronically to a different pharmacy, notify our office  through [facility] or by phone at [phone] option 4.     Si Usted Necesita Algo Despus de Su Visita  Tambin puede enviarnos un mensaje a travs de Clinical cytogeneticist. Por lo general respondemos a los mensajes de MyChart en el transcurso de 1 a 2 das hbiles.  Para renovar recetas, por favor pida a su farmacia que se ponga en contacto con nuestra oficina. Annie Sable de fax es [place] [phone].  Si tiene un asunto urgente cuando la clnica est cerrada y que no puede esperar hasta el siguiente da hbil, puede llamar/localizar a su doctor(a) al nmero que aparece a continuacin.   Por favor, tenga en cuenta que aunque hacemos todo lo posible para estar disponibles para asuntos urgentes fuera del horario de [place], no estamos disponibles las 24 horas del da, los 7 [address]  [address] de la [place].   Si tiene un problema urgente y no puede comunicarse con nosotros, puede optar por buscar atencin mdica  en el  consultorio de su doctor(a), en una clnica privada, en un centro de atencin urgente o en una sala de emergencias.  Si tiene Engineer, drilling, por favor llame inmediatamente al 911 o vaya a la sala de emergencias.  Nmeros de bper  - Dr. Gwen Pounds: [phone]  - Dra. Roseanne Reno: [phone]  - Dr. Katrinka Blazing: [phone]   En caso de inclemencias del tiempo, por favor llame a Lacy Duverney principal al [phone] para una actualizacin sobre el [place] de cualquier retraso o cierre.  Consejos para la medicacin en dermatologa: Por favor, guarde las cajas en las que vienen los medicamentos de uso tpico para ayudarle a seguir las instrucciones sobre dnde y cmo usarlos. Las farmacias generalmente imprimen las instrucciones del medicamento slo en las cajas y no directamente en los tubos del [place].   Si su medicamento es muy caro, por favor, pngase en contacto con Rolm Gala llamando al [phone] y presione la opcin 4 o envenos un mensaje a travs de Clinical cytogeneticist.   No podemos decirle cul ser su copago por los medicamentos por adelantado ya que esto es diferente dependiendo de la cobertura de su seguro. Sin embargo, es posible que podamos encontrar un medicamento sustituto a Audiological scientist un formulario para que el seguro cubra el medicamento que se considera necesario.   Si se requiere una autorizacin previa para que su compaa de seguros [place] su medicamento, por favor permtanos de 1 a 2 das hbiles para completar [address]  Los precios de los medicamentos varan con frecuencia dependiendo del Environmental consultant de dnde se surte la receta y alguna farmacias pueden ofrecer precios ms baratos.  El sitio web www.goodrx.com tiene cupones para medicamentos de Health and safety inspector. Los precios aqu no tienen en cuenta lo que podra costar con la ayuda del seguro (puede ser ms barato con su seguro), pero el sitio web puede darle el precio si no utiliz Tourist information centre manager.  -  Puede imprimir el cupn correspondiente y llevarlo con su receta a la farmacia.  - Tambin puede pasar por nuestra oficina durante el horario de atencin regular y Education officer, museum una tarjeta de cupones de GoodRx.  - Si necesita que su receta se enve electrnicamente a una farmacia diferente, informe a nuestra oficina a travs de MyChart de [place] o por telfono llamando al [phone] y presione la opcin 4.

## 2023-09-01 ENCOUNTER — Encounter: Payer: Self-pay | Admitting: Urology

## 2023-09-01 LAB — QUANTIFERON-TB GOLD PLUS
QuantiFERON Mitogen Value: >10 [IU]/mL
QuantiFERON Nil Value: 0.12 [IU]/mL
QuantiFERON TB1 Ag Value: 0.15 [IU]/mL
QuantiFERON TB2 Ag Value: 0.12 [IU]/mL
QuantiFERON-TB Gold Plus: NEGATIVE

## 2023-09-04 ENCOUNTER — Telehealth: Payer: Self-pay

## 2023-09-04 NOTE — Telephone Encounter (Addendum)
Called patient. N/A. LMOVM to C/B to schedule time on nurse schedule for Skyrizi start week 0 injection and week 4 injection then 2 month follow up with Dr. Katrinka Blazing. Skyrizi maintenance dose Rx sent to [facility] day of appointment 08/29/2023.

## 2023-09-04 NOTE — Telephone Encounter (Signed)
-----   Message from [place] sent at 09/03/2023  3:52 PM EST ----- Please call to share that quant gold was negative meaning no TB infection and arrange for patient to start skyrizi samples with follow up in 2 months. Thank you

## 2023-09-13 ENCOUNTER — Other Ambulatory Visit: Payer: Self-pay

## 2023-09-13 DIAGNOSIS — L409 Psoriasis, unspecified: Secondary | ICD-10-CM

## 2023-09-13 DIAGNOSIS — Z79899 Other long term (current) drug therapy: Secondary | ICD-10-CM

## 2023-09-13 MED ORDER — SKYRIZI PEN 150 MG/ML ~~LOC~~ SOAJ
150.0000 mg | SUBCUTANEOUS | 1 refills | Status: DC
Start: 2023-09-13 — End: 2023-10-02

## 2023-10-02 ENCOUNTER — Telehealth: Payer: Self-pay

## 2023-10-02 DIAGNOSIS — L409 Psoriasis, unspecified: Secondary | ICD-10-CM

## 2023-10-02 DIAGNOSIS — Z79899 Other long term (current) drug therapy: Secondary | ICD-10-CM

## 2023-10-02 MED ORDER — SKYRIZI PEN 150 MG/ML ~~LOC~~ SOAJ: 150.0000 mg | SUBCUTANEOUS | 1 refills | Status: DC

## 2023-10-02 NOTE — Telephone Encounter (Signed)
Patient called and insurance requires her to fill through [employer]. RX transferred. aw

## 2023-10-10 ENCOUNTER — Other Ambulatory Visit: Payer: Self-pay

## 2023-10-10 ENCOUNTER — Other Ambulatory Visit (HOSPITAL_COMMUNITY): Payer: Self-pay

## 2023-10-10 ENCOUNTER — Encounter (HOSPITAL_COMMUNITY): Payer: Self-pay

## 2023-10-10 DIAGNOSIS — L409 Psoriasis, unspecified: Secondary | ICD-10-CM

## 2023-10-10 DIAGNOSIS — Z79899 Other long term (current) drug therapy: Secondary | ICD-10-CM

## 2023-10-10 MED ORDER — SKYRIZI PEN 150 MG/ML ~~LOC~~ SOAJ
150.0000 mg | SUBCUTANEOUS | 1 refills | Status: DC
Start: 2023-10-10 — End: 2024-07-22
  Filled 2023-10-10: qty 1, fill #0
  Filled 2024-02-01: qty 1, 84d supply, fill #0
  Filled 2024-04-22: qty 1, 84d supply, fill #1

## 2023-10-10 MED ORDER — SKYRIZI PEN 150 MG/ML ~~LOC~~ SOAJ
150.0000 mg | SUBCUTANEOUS | 1 refills | Status: DC
Start: 2023-10-10 — End: 2023-10-10
  Filled 2023-10-10: qty 1, fill #0

## 2023-10-10 NOTE — Progress Notes (Signed)
Transfer of pharmacy per [facility]. aw

## 2023-10-11 ENCOUNTER — Other Ambulatory Visit (HOSPITAL_COMMUNITY): Payer: Self-pay

## 2023-10-11 ENCOUNTER — Encounter: Payer: Self-pay | Admitting: Urology

## 2023-10-11 ENCOUNTER — Telehealth: Payer: Self-pay | Admitting: *Deleted

## 2023-10-11 ENCOUNTER — Other Ambulatory Visit: Payer: Self-pay

## 2023-10-11 MED ORDER — RISANKIZUMAB-RZAA 150 MG/ML ~~LOC~~ SOSY: PREFILLED_SYRINGE | SUBCUTANEOUS | 1 refills | Status: DC

## 2023-10-11 NOTE — Telephone Encounter (Signed)
Pt left message on triage VM asking to come and leave urine sample. Pt would need an office visit.  Marland Kitchenleft message to have patient return my call to get more information.

## 2023-10-11 NOTE — Telephone Encounter (Signed)
Pt returned my call and stated she seen blood in her urine twice today. I explained that she would need to be seen, pt states she was told if she seen blood that she could drop off a sample. I advised again she would need an appt and pt states she will call back if it gets worse.

## 2023-10-26 ENCOUNTER — Telehealth: Payer: Self-pay

## 2023-10-26 NOTE — Telephone Encounter (Signed)
 Patient's insurance is cover maintenance dosing but not loading dose. Patient supplied loading dose from our office. Will fill maintenance dose through [facility].   1) LOT [id number] EXP 07/2024 2) LOT [id number] EXP 11/2024   Dorathy Daft, RMA

## 2023-11-10 ENCOUNTER — Other Ambulatory Visit: Payer: Self-pay

## 2023-11-13 ENCOUNTER — Other Ambulatory Visit: Payer: Self-pay

## 2023-11-15 ENCOUNTER — Other Ambulatory Visit: Payer: Self-pay

## 2023-11-16 ENCOUNTER — Other Ambulatory Visit: Payer: Self-pay

## 2023-11-16 ENCOUNTER — Encounter (HOSPITAL_COMMUNITY): Payer: Self-pay

## 2023-11-16 NOTE — Progress Notes (Signed)
Patient received sample from office for first maintenance dose on 1/28. She will not need until April.

## 2023-11-16 NOTE — Progress Notes (Signed)
Specialty Pharmacy Initiation Note   Virginia Weaver is a 73 y.o. female who will be followed by the specialty pharmacy service for RxSp Psoriasis    Review of administration, indication, effectiveness, safety, potential side effects, storage/disposable, and missed dose instructions occurred today for patient's specialty medication(s) Risankizumab-rzaa (Skyrizi Pen)     Patient/Caregiver did not have any additional questions or concerns.   Patient's therapy is appropriate to: Continue    Goals Addressed             This Visit's Progress    Reduce signs and symptoms       Patient is on track. Patient will maintain adherence. Patient has already initiated therapy with samples an is seeing good results.          Otto Herb Specialty Pharmacist

## 2023-12-06 ENCOUNTER — Ambulatory Visit: Payer: BC Managed Care – PPO | Admitting: Urology

## 2023-12-06 VITALS — BP 157/80 | HR 80 | Ht 64.0 in | Wt 170.0 lb

## 2023-12-06 DIAGNOSIS — N3281 Overactive bladder: Secondary | ICD-10-CM | POA: Diagnosis not present

## 2023-12-06 DIAGNOSIS — N3020 Other chronic cystitis without hematuria: Secondary | ICD-10-CM

## 2023-12-06 LAB — MICROSCOPIC EXAMINATION: Bacteria, UA: NONE SEEN

## 2023-12-06 LAB — URINALYSIS, COMPLETE
Bilirubin, UA: NEGATIVE
Glucose, UA: NEGATIVE
Ketones, UA: NEGATIVE
Leukocytes,UA: NEGATIVE
Nitrite, UA: NEGATIVE
Protein,UA: NEGATIVE
Specific Gravity, UA: 1.015 (ref 1.005–1.030)
Urobilinogen, Ur: 0.2 mg/dL (ref 0.2–1.0)
pH, UA: 7 (ref 5.0–7.5)

## 2023-12-06 NOTE — Patient Instructions (Signed)

## 2023-12-06 NOTE — Progress Notes (Signed)
Marcelle Overlie Plume,acting as a scribe for [name], MD.,have documented all relevant documentation on the behalf of [name], MD,as directed by  [name], MD while in the presence of [name], MD.  12/06/23 10:30 AM   Virginia Weaver 1951/07/08 [id number]  Referring provider: Alm Bustard, NP [address] [place],  [place] [address]  Chief Complaint  Patient presents with   Cystitis    HPI: 73 year-old female with a personal history of breast cancer, chronic cystitis, OAB, and microscopic hematuria who presents today for follow up.   She has had an extensive evaluation of her bladder, including several biopsies that all show chronic cystitis with lymphoid aggregates and mixed inflammatory infiltrates, including eosinophils and plasma cells. Her last biopsy was in 2022 that showed areas of ulceration. She has also had a workup in 2022 for a possibly low-grade UPJ obstruction that included a diagnostic ureteroscopy and ultimately a lasix renal scan that was normal.   Her urinalysis today is negative.  She had a urinalysis in December that showed 36 RBC per high powered field, but was otherwise unremarkable.   Since her last visit, she has been feeling more comfortable. She did have 1 UTI in 05/2023 for which she went to the emergency room. She continues to experience burning, which is alleviated by Azo.   PMH: Past Medical History:  Diagnosis Date   Acid reflux 04/02/2001   Adiposity 04/20/1993   Anemia    h/o   Arthritis    Benign essential HTN 11/11/1993   Carotid atherosclerosis, bilateral    COVID-19 08/2021   Diabetes mellitus without complication (HCC)    diet    Heart murmur    Hypertension    RSV (respiratory syncytial virus infection) 09/2021   Syncope and collapse 01/20/2009   Tinnitus    left ear   Urticaria 06/04/2015    Surgical History: Past Surgical History:  Procedure Laterality Date   CYSTOSCOPY W/ RETROGRADES  Bilateral 11/04/2021   Procedure: CYSTOSCOPY WITH RETROGRADE PYELOGRAM;  Surgeon: [name], MD;  Location: [facility];  Service: Urology;  Laterality: Bilateral;   CYSTOSCOPY WITH BIOPSY N/A 12/22/2015   Procedure: CYSTOSCOPY WITH BIOPSY;  Surgeon: [name], MD;  Location: [facility];  Service: Urology;  Laterality: N/A;   CYSTOSCOPY WITH BIOPSY N/A 11/04/2021   Procedure: CYSTOSCOPY WITH BLADDER  BIOPSY;  Surgeon: [name], MD;  Location: [facility];  Service: Urology;  Laterality: N/A;   CYSTOSCOPY WITH FULGERATION N/A 11/04/2021   Procedure: CYSTOSCOPY WITH FULGERATION;  Surgeon: [name], MD;  Location: [facility];  Service: Urology;  Laterality: N/A;   TUBAL LIGATION  1987   TUMMY TUCK  06/17/2010   URETEROSCOPY Right 11/04/2021   Procedure: DIAGNOSTIC URETEROSCOPY;  Surgeon: [name], MD;  Location: [facility];  Service: Urology;  Laterality: Right;    Home Medications:  Allergies as of 12/06/2023       Reactions   Ibuprofen    Other reaction(s): Abdominal Pain Can't take NSAIDS secondary to erosions due to NSAIDS.   Nsaids    Can't take NSAIDS secondary to erosions due to NSAIDS.        Medication List        Accurate as of December 06, 2023 10:30 AM. If you have any questions, ask your nurse or doctor.          STOP taking these medications    Ginkgo Biloba 50 MG Caps  TAKE these medications    ALPRAZolam 0.5 MG tablet Commonly known as: XANAX Take 1 tablet (0.5 mg total) by mouth daily as needed for up to 45 doses for anxiety (may repeat dose if needed; max of 15 times/month). What changed: when to take this   calcium carbonate 600 MG Tabs tablet Commonly known as: OS-CAL Take 1 tablet by mouth 2 (two) times daily with a meal.   Cholecalciferol 25 MCG (1000 UT) capsule Take 1,000 Units by mouth daily.   Cinnamon 500 MG capsule Take 1,000 mg by mouth 2 (two) times daily.   clobetasol 0.05 % external solution Commonly known  as: TEMOVATE Apply once or twice daily to affected areas on scalp as needed for itching. Avoid applying to face, groin, and axilla.   CoQ10 200 MG Caps Take 1 capsule by mouth daily.   dicyclomine 20 MG tablet Commonly known as: BENTYL Take 1 tablet (20 mg total) by mouth every 8 (eight) hours as needed for spasms (Abdominal cramping).   docusate sodium 100 MG capsule Commonly known as: COLACE Take 100 mg by mouth daily.   ESTER C PO Take 1,000 mg by mouth daily.   Fish Oil 500 MG Caps Take 500 mg by mouth daily.   fluorouracil 5 % cream Commonly known as: EFUDEX Apply to affected area on nose twice a day x 4 days   gabapentin 100 MG capsule Commonly known as: NEURONTIN TAKE 1 CAPSULE EVERY MORNING  AND TAKE 2 CAPSULES AT BEDTIME   Hair/Skin/Nails Tabs Take 1 tablet by mouth in the morning and at bedtime.   HYDROcodone-acetaminophen 5-325 MG tablet Commonly known as: NORCO/VICODIN Take 1-2 tablets by mouth every 6 (six) hours as needed for moderate pain.   hydrocortisone 25 MG suppository Commonly known as: ANUSOL-HC UNWRAP AND INSERT 1 SUPPOSITORY RECALLY TWICE A DAY   levocetirizine 5 MG tablet Commonly known as: XYZAL TAKE 1 TABLET BY MOUTH DAILY What changed:  how much to take how to take this when to take this   metFORMIN 500 MG tablet Commonly known as: GLUCOPHAGE TAKE ONE TABLET BY MOUTH TWICE DAILY WITH A MEAL   montelukast 10 MG tablet Commonly known as: SINGULAIR TAKE 1 TABLET BY MOUTH DAILY   ondansetron 4 MG disintegrating tablet Commonly known as: ZOFRAN-ODT Take 1 tablet (4 mg total) by mouth every 6 (six) hours as needed for nausea or vomiting.   pantoprazole 40 MG tablet Commonly known as: Protonix Take 1 tablet (40 mg total) by mouth daily.   pravastatin 40 MG tablet Commonly known as: PRAVACHOL Take 40 mg by mouth at bedtime.   PREVAGEN PO Take by mouth.   PROBIOTIC ACIDOPHILUS PO Take 1 capsule by mouth daily.   ramipril 5 MG  capsule Commonly known as: ALTACE Take 5 mg by mouth at bedtime.   risankizumab-rzaa 150 MG/ML Sosy prefilled syringe Commonly known as: SKYRIZI inject 1ml into the skin as directed every 12 weeks for maintenance   Skyrizi Pen 150 MG/ML pen Generic drug: risankizumab-rzaa Inject 1 mL (150 mg total) into the skin as directed. Every 12 weeks for maintenance.   SUPER B COMPLEX & C Tabs Take 1 tablet by mouth daily.   triamcinolone cream 0.1 % Commonly known as: KENALOG USE TOPICALLY TWICE DAILY AS NEEDED   UNABLE TO FIND Total Beets   Vtama 1 % Crea Generic drug: Tapinarof Apply 1 application topically daily.        Allergies:  Allergies  Allergen Reactions  Ibuprofen     Other reaction(s): Abdominal Pain Can't take NSAIDS secondary to erosions due to NSAIDS.   Nsaids     Can't take NSAIDS secondary to erosions due to NSAIDS.    Family History: Family History  Problem Relation Age of Onset   Heart attack Mother    Stroke Mother    Diabetes Mother    Throat cancer Mother    Heart attack Father    Hypertension Father    Glaucoma Father    AAA (abdominal aortic aneurysm) Father    Obesity Sister    Throat cancer Maternal Grandmother    Heart attack Maternal Grandfather    CVA Paternal Grandmother    Congestive Heart Failure Paternal Grandmother    Heart attack Paternal Grandfather    Breast cancer Cousin     Social History:  reports that she has never smoked. She has never used smokeless tobacco. She reports that she does not drink alcohol and does not use drugs.   Physical Exam: BP (!) 157/80 (BP Location: Left Arm, Patient Position: Sitting, Cuff Size: Large)   Pulse 80   Ht 5\' 4"  (1.626 m)   Wt 170 lb (77.1 kg)   BMI 29.18 kg/m   Constitutional:  Alert and oriented, No acute distress. HEENT: [place] AT, moist mucus membranes.  Trachea midline, no masses. Neurologic: Grossly intact, no focal deficits, moving all 4 extremities. Psychiatric: Normal mood  and affect.   Assessment & Plan:    1. Chronic cystitis/ Microscopic hematuria - She has not had a cystoscopy since 10/2021.  - Her last CT scan was in 10/2022, which has showed bladder wall thickening, otherwise no other pathology - Would consider an office cystoscopy to ensure that she has not developed any interval underlying bladder pathology in light of ongoing hematuria  2. OAB/ chronic dysuria - Continue current management with Azo for symptomatic relief as she reports improvement with its use.  Return for cystoscopy.  I have reviewed the above documentation for accuracy and completeness, and I agree with the above.   [name], MD  [facility] [address] [place], [place] [address] [phone]

## 2024-01-29 ENCOUNTER — Other Ambulatory Visit: Payer: Self-pay

## 2024-01-29 NOTE — Progress Notes (Signed)
 Pharmacy Patient Advocate Encounter  Insurance verification completed.   The patient is insured through [employer]   Ran test claim for [employer]. Cco-pay is $0.  This test claim was processed through [facility]- copay amounts may vary at other pharmacies due to pharmacy/plan contracts, or as the patient moves through the different stages of their insurance plan.

## 2024-02-01 ENCOUNTER — Other Ambulatory Visit: Payer: Self-pay

## 2024-02-01 ENCOUNTER — Other Ambulatory Visit (HOSPITAL_COMMUNITY): Payer: Self-pay

## 2024-02-01 NOTE — Progress Notes (Signed)
 Specialty Pharmacy Initial Fill Coordination Note  SCHAE CANDO is a 73 y.o. female contacted today regarding initial fill of specialty medication(s) Risankizumab-rzaa Cristy Folks Pen)   Patient requested Delivery   Delivery date: 02/07/24   Verified address: [address]   Medication will be filled on 4/15.   Patient is aware of $0 copayment.

## 2024-02-06 ENCOUNTER — Other Ambulatory Visit: Payer: Self-pay

## 2024-02-07 ENCOUNTER — Ambulatory Visit: Payer: BC Managed Care – PPO | Admitting: Urology

## 2024-02-07 VITALS — BP 127/79 | HR 77

## 2024-02-07 DIAGNOSIS — N3020 Other chronic cystitis without hematuria: Secondary | ICD-10-CM | POA: Diagnosis not present

## 2024-02-07 DIAGNOSIS — N3281 Overactive bladder: Secondary | ICD-10-CM

## 2024-02-07 LAB — URINALYSIS, COMPLETE
Bilirubin, UA: NEGATIVE
Glucose, UA: NEGATIVE
Ketones, UA: NEGATIVE
Leukocytes,UA: NEGATIVE
Nitrite, UA: NEGATIVE
Protein,UA: NEGATIVE
Specific Gravity, UA: 1.015 (ref 1.005–1.030)
Urobilinogen, Ur: 0.2 mg/dL (ref 0.2–1.0)
pH, UA: 6 (ref 5.0–7.5)

## 2024-02-07 LAB — MICROSCOPIC EXAMINATION: Epithelial Cells (non renal): >10 /HPF — AB (ref 0–10)

## 2024-02-07 NOTE — Progress Notes (Unsigned)
   02/07/24  CC:  Chief Complaint  Patient presents with   Cysto    HPI: Virginia Weaver is a 73 y.o.female with a personal history of OAB, nocturia, and microscopic hematuria, who presents today for a cystoscopy.   She is s/p negative bladder biopsy in 2017 that was consistent with chronic cystitis with lymphoid aggregates and mixed inflammatory infiltrates including eosinophils and plasma cells.  She is doing well today.    Vitals:   02/07/24 0847  BP: 127/79  Pulse: 77  NED. A&Ox3.   No respiratory distress   Abd soft, NT, ND Normal external genitalia with patent urethral meatus  Cystoscopy Procedure Note  Patient identification was confirmed, informed consent was obtained, and patient was prepped using Betadine solution.  Lidocaine jelly was administered per urethral meatus.    Procedure: - Flexible cystoscope introduced, without any difficulty.   - Thorough search of the bladder revealed:    normal urethral meatus    no stones    no ulcers     no tumors    no urethral polyps    no trabeculation     Left interior lateral bladder wall has chronic mild erythema, inflammation,no papillary tumor in the bladder.  This appears more subtle than on prior cystoscopy.    - Ureteral orifices were normal in position and appearance.  Post-Procedure: - Patient tolerated the procedure well  Assessment/ Plan:  Chronic cystitis/chronic microscopic hematuria - bladder appears to have stable chronic cystitis which she had previously with negative biopsy - Clinically doing well at this time - Continue conservative management for both low-grade UPJ obstruction as well as the above  Follow-up in 1 year with [facility]  Dustin Gimenez, MD

## 2024-03-27 ENCOUNTER — Other Ambulatory Visit: Payer: Self-pay | Admitting: Family Medicine

## 2024-03-27 DIAGNOSIS — Z1231 Encounter for screening mammogram for malignant neoplasm of breast: Secondary | ICD-10-CM

## 2024-03-28 ENCOUNTER — Ambulatory Visit
Admission: RE | Admit: 2024-03-28 | Discharge: 2024-03-28 | Disposition: A | Discharge status: Home / Self Care | Source: Ambulatory Visit | Attending: Family Medicine | Admitting: Family Medicine

## 2024-03-28 DIAGNOSIS — Z1231 Encounter for screening mammogram for malignant neoplasm of breast: Secondary | ICD-10-CM | POA: Insufficient documentation

## 2024-04-16 ENCOUNTER — Other Ambulatory Visit: Payer: Self-pay | Admitting: Specialist

## 2024-04-16 DIAGNOSIS — R918 Other nonspecific abnormal finding of lung field: Secondary | ICD-10-CM

## 2024-04-18 ENCOUNTER — Other Ambulatory Visit: Payer: Self-pay

## 2024-04-22 ENCOUNTER — Ambulatory Visit
Admission: RE | Admit: 2024-04-22 | Discharge: 2024-04-22 | Disposition: A | Discharge status: Home / Self Care | Source: Ambulatory Visit | Attending: Specialist | Admitting: Specialist

## 2024-04-22 ENCOUNTER — Other Ambulatory Visit: Payer: Self-pay

## 2024-04-22 DIAGNOSIS — R918 Other nonspecific abnormal finding of lung field: Secondary | ICD-10-CM

## 2024-04-22 NOTE — Progress Notes (Signed)
 Specialty Pharmacy Refill Coordination Note  Virginia Weaver is a 73 y.o. female contacted today regarding refills of specialty medication(s) Risankizumab -rzaa (Skyrizi  Pen)   Patient requested Delivery   Delivery date: 05/07/24   Verified address: [address]   Medication will be filled on 05/06/24.

## 2024-04-22 NOTE — Progress Notes (Signed)
 Specialty Pharmacy Ongoing Clinical Assessment Note  Virginia Weaver is a 73 y.o. female who is being followed by the specialty pharmacy service for RxSp Psoriasis   Patient's specialty medication(s) reviewed today: Risankizumab -rzaa (Skyrizi  Pen)   Missed doses in the last 4 weeks: 0   Patient/Caregiver did not have any additional questions or concerns.   Therapeutic benefit summary: Patient is achieving benefit   Adverse events/side effects summary: No adverse events/side effects   Patient's therapy is appropriate to: Continue    Goals Addressed             This Visit's Progress    Reduce signs and symptoms   On track    Patient is on track. Patient will maintain adherence. Patient has already initiated therapy with samples an is seeing good results.          Follow up: 12 months  [name] Specialty Pharmacist

## 2024-05-03 ENCOUNTER — Other Ambulatory Visit: Payer: Self-pay

## 2024-05-23 ENCOUNTER — Other Ambulatory Visit: Payer: Self-pay | Admitting: Family Medicine

## 2024-05-23 DIAGNOSIS — I350 Nonrheumatic aortic (valve) stenosis: Secondary | ICD-10-CM

## 2024-05-23 DIAGNOSIS — E7800 Pure hypercholesterolemia, unspecified: Secondary | ICD-10-CM

## 2024-05-23 DIAGNOSIS — I10 Essential (primary) hypertension: Secondary | ICD-10-CM

## 2024-05-23 DIAGNOSIS — R001 Bradycardia, unspecified: Secondary | ICD-10-CM

## 2024-05-23 DIAGNOSIS — R42 Dizziness and giddiness: Secondary | ICD-10-CM

## 2024-06-06 ENCOUNTER — Ambulatory Visit
Admission: RE | Admit: 2024-06-06 | Discharge: 2024-06-06 | Disposition: A | Discharge status: Home / Self Care | Payer: Self-pay | Source: Ambulatory Visit | Attending: Family Medicine | Admitting: Family Medicine

## 2024-06-06 DIAGNOSIS — I10 Essential (primary) hypertension: Secondary | ICD-10-CM | POA: Insufficient documentation

## 2024-06-06 DIAGNOSIS — R001 Bradycardia, unspecified: Secondary | ICD-10-CM | POA: Insufficient documentation

## 2024-06-06 DIAGNOSIS — I350 Nonrheumatic aortic (valve) stenosis: Secondary | ICD-10-CM | POA: Insufficient documentation

## 2024-06-06 DIAGNOSIS — R42 Dizziness and giddiness: Secondary | ICD-10-CM | POA: Insufficient documentation

## 2024-06-06 DIAGNOSIS — E7800 Pure hypercholesterolemia, unspecified: Secondary | ICD-10-CM | POA: Insufficient documentation

## 2024-06-10 ENCOUNTER — Other Ambulatory Visit (HOSPITAL_COMMUNITY): Payer: Self-pay

## 2024-06-11 ENCOUNTER — Other Ambulatory Visit (HOSPITAL_COMMUNITY): Payer: Self-pay

## 2024-06-14 NOTE — Progress Notes (Signed)
 Established Patient Visit   Chief Complaint: Coronary artery disease  Date of Service: 06/14/2024 Date of Birth: 1950-12-19 PCP: Harvey Gaetana Helling, NP  History of Present Illness: Virginia Weaver is a 73 y.o.female patient who presented for evaluation of coronary artery disease  Past medical history significant for CAD as noted by high CAC score of 1729 on cardiac CT 05/2024 which is 99 percentile, hypertension, diabetes, hyperlipidemia, mild aortic stenosis, family history of CAD.  Echocardiogram 10/2023 with normal biventricular systolic function with mild aortic stenosis.  Holter 05/2024 with sinus rhythm and frequent PACs with burden of 21%.  5 patient symptomatic episodes associated with sinus rhythm with frequent PACs.  Patient details that she overall stays active, walks for 20 to 30 minutes without any complaints of chest pain/pressure or shortness of breath.  Has some fatigue.  No palpitation.  Has intermittent dizziness.  Prior EKG showed sinus rhythm.  Past Medical and Surgical History  Past Medical History Past Medical History:  Diagnosis Date  . Ache in joint   . Anxiety   . Carotid atherosclerosis   . Depression   . Diabetes mellitus type 2, uncomplicated (CMS/HHS-HCC)   . GERD (gastroesophageal reflux disease)   . Heart murmur   . Hyperlipidemia   . Hypertension   . Myalgia   . Syncope and collapse   . Tinnitus of left ear   . Urticaria   . Valvular heart disease   . Vitamin B 12 deficiency     Past Surgical History She has a past surgical history that includes Tubal ligation and tummy tuck.   Medications and Allergies  Current Medications  Current Outpatient Medications  Medication Sig Dispense Refill  . ALPRAZolam  (XANAX ) 0.5 MG tablet Take 1 tablet (0.5 mg total) by mouth at bedtime as needed for Sleep 30 tablet 4  . apoaequorin (PREVAGEN) capsule Take 1 capsule by mouth once daily    . calcium carbonate 500 mg calcium (1,250 mg) tablet Take 1,000 mg of  elemental by mouth once daily      . cholecalciferol (VITAMIN D3) 1,000 unit capsule Take 1,000 Units by mouth once daily      . cinnamon bark 500 mg capsule Take 500 mg by mouth 2 (two) times daily       . clobetasoL  (CORMAX ) 0.05 % external solution Apply once or twice daily to affected areas on scalp as needed for itching. Avoid applying to face, groin, and axilla.    . cyanocobalamin  (VITAMIN B12) 1000 MCG tablet Take 1,000 mcg by mouth once daily.      SABRA levocetirizine (XYZAL) 5 MG tablet Take 1 tablet (5 mg total) by mouth once daily 90 tablet 3  . metFORMIN  (GLUCOPHAGE ) 500 MG tablet Take 1 tablet (500 mg total) by mouth 2 (two) times daily 180 tablet 1  . pantoprazole  (PROTONIX ) 40 MG DR tablet Take 1 tablet (40 mg total) by mouth once daily 90 tablet 1  . ramipriL  (ALTACE ) 5 MG capsule TAKE 1 CAPSULE EVERY DAY 90 capsule 3  . risankizumab -rzaa (SKYRIZI ) 150 mg/mL PnIj Inject 150 mg subcutaneously every 3 (three) months    . tapinarof  (VTAMA ) 1 % Crea Apply 1 Application topically as needed    . triamcinolone  0.1 % cream APPLY TO AFFECTED AREAS TWICE DAILY    . UNABLE TO FIND Take 2 capsules by mouth 2 (two) times daily Total Beets    . atorvastatin (LIPITOR) 40 MG tablet Take 1 tablet (40 mg total) by mouth once daily  30 tablet 11  . metoprolol TARTrate (LOPRESSOR) 25 MG tablet Take 1 tablet (25 mg total) by mouth 2 (two) times daily 60 tablet 11  . montelukast (SINGULAIR) 10 mg tablet Take 1 tablet (10 mg total) by mouth at bedtime (Patient not taking: Reported on 06/14/2024) 30 tablet 3   Current Facility-Administered Medications  Medication Dose Route Frequency Provider Last Rate Last Admin  . cyanocobalamin  (VITAMIN B12) injection 1,000 mcg  1,000 mcg Intramuscular Q30 Days Fields, Glenda Lynn, NP   1,000 mcg at 05/22/24 9148    Allergies: Ibuprofen, Nsaids (non-steroidal anti-inflammatory drug), and Tolmetin  Social and Family History  Social History  reports that she has  never smoked. She has never used smokeless tobacco. She reports that she does not drink alcohol and does not use drugs.  Family History Family History  Problem Relation Name Age of Onset  . Arthritis Mother    . Diabetes Mother    . Arthritis Father    . Coronary Artery Disease (Blocked arteries around heart) Father    . Arthritis Daughter    . Psoriasis Daughter      Review of Systems   Review of Systems: The patient denies chest pain, shortness of breath, orthopnea, paroxysmal nocturnal dyspnea, pedal edema, palpitations, heart racing, presyncope, syncope.   Physical Examination   Vitals:BP 122/74   Pulse 74   Ht 162.6 cm (5' 4)   Wt 72.1 kg (159 lb)   LMP  (LMP Unknown)   SpO2 97%   BMI 27.29 kg/m  Ht:162.6 cm (5' 4) Wt:72.1 kg (159 lb) ADJ:Anib surface area is 1.8 meters squared. Body mass index is 27.29 kg/m.  HEENT: Pupils equally reactive to light and accomodation  Neck: Supple, no significant JVD Lungs: clear to auscultation bilaterally; no wheezes, rales, rhonchi Heart: Regular rate and rhythm.grade 2/6 systolic murmur aortic area Extremities: no pedal edema  Assessment and Plan   73 y.o. female with Coronary artery disease as noted by very high CAC score of 1729 on cardiac CT 05/2024 Mild aortic stenosis Frequent PACs with burden of 21 point Hypertension Hyperlipidemia Diabetes  Will have exercise nuclear stress test to evaluate for any significant ischemia. Discussed aggressive risk factor modification Recommend aspirin 81 mg daily. Will change pravastatin  to atorvastatin 40 mg daily. Metoprolol 25 mg twice daily to help with frequent PACs Mediterranean diet and regular activity  Orders Placed This Encounter  Procedures  . NM myocardial perfusion SPECT multiple (stress and rest)  . ECG stress test only    Return in about 3 weeks (around 07/05/2024).  Virginia CHAITANYA ALLURI, MD  This dictation was prepared with dragon dictation. Any  transcription errors that result from this process are unintentional.

## 2024-06-17 ENCOUNTER — Other Ambulatory Visit: Payer: Self-pay

## 2024-06-29 ENCOUNTER — Emergency Department (HOSPITAL_BASED_OUTPATIENT_CLINIC_OR_DEPARTMENT_OTHER)
Admission: EM | Admit: 2024-06-29 | Discharge: 2024-06-29 | Disposition: A | Discharge status: Home / Self Care | Attending: Emergency Medicine | Admitting: Emergency Medicine

## 2024-06-29 ENCOUNTER — Other Ambulatory Visit: Payer: Self-pay

## 2024-06-29 DIAGNOSIS — Z7984 Long term (current) use of oral hypoglycemic drugs: Secondary | ICD-10-CM | POA: Insufficient documentation

## 2024-06-29 DIAGNOSIS — R252 Cramp and spasm: Secondary | ICD-10-CM | POA: Diagnosis present

## 2024-06-29 DIAGNOSIS — E119 Type 2 diabetes mellitus without complications: Secondary | ICD-10-CM | POA: Diagnosis not present

## 2024-06-29 DIAGNOSIS — I10 Essential (primary) hypertension: Secondary | ICD-10-CM | POA: Insufficient documentation

## 2024-06-29 DIAGNOSIS — R109 Unspecified abdominal pain: Secondary | ICD-10-CM | POA: Diagnosis not present

## 2024-06-29 DIAGNOSIS — Z79899 Other long term (current) drug therapy: Secondary | ICD-10-CM | POA: Diagnosis not present

## 2024-06-29 LAB — CBC WITH DIFFERENTIAL/PLATELET
Abs Immature Granulocytes: 0.02 K/uL (ref 0.00–0.07)
Basophils Absolute: 0 K/uL (ref 0.0–0.1)
Basophils Relative: 1 %
Eosinophils Absolute: 0.3 K/uL (ref 0.0–0.5)
Eosinophils Relative: 5 %
HCT: 39.2 % (ref 36.0–46.0)
Hemoglobin: 13.3 g/dL (ref 12.0–15.0)
Immature Granulocytes: 0 %
Lymphocytes Relative: 24 %
Lymphs Abs: 1.5 K/uL (ref 0.7–4.0)
MCH: 30.9 pg (ref 26.0–34.0)
MCHC: 33.9 g/dL (ref 30.0–36.0)
MCV: 91 fL (ref 80.0–100.0)
Monocytes Absolute: 0.5 K/uL (ref 0.1–1.0)
Monocytes Relative: 7 %
Neutro Abs: 4 K/uL (ref 1.7–7.7)
Neutrophils Relative %: 63 %
Platelets: 181 K/uL (ref 150–400)
RBC: 4.31 MIL/uL (ref 3.87–5.11)
RDW: 12.8 % (ref 11.5–15.5)
WBC: 6.4 K/uL (ref 4.0–10.5)
nRBC: 0 % (ref 0.0–0.2)

## 2024-06-29 LAB — COMPREHENSIVE METABOLIC PANEL WITH GFR
ALT: 14 U/L (ref 0–44)
AST: 26 U/L (ref 15–41)
Albumin: 4.3 g/dL (ref 3.5–5.0)
Alkaline Phosphatase: 57 U/L (ref 38–126)
Anion gap: 12 (ref 5–15)
BUN: 19 mg/dL (ref 8–23)
CO2: 26 mmol/L (ref 22–32)
Calcium: 9.9 mg/dL (ref 8.9–10.3)
Chloride: 104 mmol/L (ref 98–111)
Creatinine, Ser: 0.78 mg/dL (ref 0.44–1.00)
GFR, Estimated: >60 mL/min (ref >60)
Glucose, Bld: 134 mg/dL — ABNORMAL HIGH (ref 70–99)
Potassium: 4.4 mmol/L (ref 3.5–5.1)
Sodium: 142 mmol/L (ref 135–145)
Total Bilirubin: 0.7 mg/dL (ref 0.0–1.2)
Total Protein: 6.5 g/dL (ref 6.5–8.1)

## 2024-06-29 LAB — CK: Total CK: 97 U/L (ref 38–234)

## 2024-06-29 NOTE — ED Provider Notes (Signed)
 [place] EMERGENC[facility] DEPARTMENT AT [facility] Provider Note   CSN: [id number] Arrival date & time: 06/29/24  1052     Patient presents with: Hand Pain   Virginia Weaver is a 73 y.o. female.   The history is provided by the patient and medical records. No language interpreter was used.  Hand Pain     73 year old female with history of hypertension, diabetes, anemia, B12 deficiency presenting with complaint of cramping.  Patient reports cramping in her left hand and left leg since last night.  She mention she has had cramps before but this feels more intense.  She also mention she recently had a bout of dizzy spells prompting cardiology visit and states she had a coronary CT scan done and I failed.  She just had a coronary stress test done and currently waiting for the result.  Due to her hand cramping she reached out to her doctor and was recommended to come to the ER.  Patient also reports about 2 weeks ago her cardiologist took her off Pravacol and placed her on Lipitor which is a new medication for her.  Currently she denies having any chest pain shortness of breath lightheadedness dizziness nausea or dysuria.  No recent strenuous activities or heavy lifting.  She is right-hand dominant.  No history of PE or DVT.  Prior to Admission medications   Medication Sig Start Date End Date Taking? Authorizing Provider  ALPRAZolam  (XANAX ) 0.5 MG tablet Take 1 tablet (0.5 mg total) by mouth daily as needed for up to 45 doses for anxiety (may repeat dose if needed; max of 15 times/month). Patient taking differently: Take 0.5 mg by mouth at bedtime. 10/08/21   Emilio Kelly DASEN, FNP  Apoaequorin (PREVAGEN PO) Take by mouth.    [provider]  Bioflavonoid Products (ESTER C PO) Take 1,000 mg by mouth daily.    [provider]  calcium carbonate (OS-CAL) 600 MG TABS tablet Take 1 tablet by mouth 2 (two) times daily with a meal.    [provider]  Cholecalciferol  1000 UNITS capsule Take 1,000 Units by mouth daily.    [provider]  Cinnamon 500 MG capsule Take 1,000 mg by mouth 2 (two) times daily.    [provider]  clobetasol  (TEMOVATE ) 0.05 % external solution Apply once or twice daily to affected areas on scalp as needed for itching. Avoid applying to face, groin, and axilla. 08/29/23   Claudene Lehmann, MD  Coenzyme Q10 (COQ10) 200 MG CAPS Take 1 capsule by mouth daily. 12/15/16   Vivienne Delon HERO, PA-C  docusate sodium (COLACE) 100 MG capsule Take 100 mg by mouth daily.    [provider]  fluorouracil  (EFUDEX ) 5 % cream Apply to affected area on nose twice a day x 4 days 11/09/21   Moye, Virginia , MD  gabapentin  (NEURONTIN ) 100 MG capsule TAKE 1 CAPSULE EVERY MORNING  AND TAKE 2 CAPSULES AT BEDTIME 11/04/20   Vivienne Delon HERO, PA-C  HYDROcodone -acetaminophen  (NORCO/VICODIN) 5-325 MG tablet Take 1-2 tablets by mouth every 6 (six) hours as needed for moderate pain. 11/04/21   Penne Knee, MD  hydrocortisone  (ANUSOL -HC) 25 MG suppository UNWRAP AND INSERT 1 SUPPOSITORY RECALLY TWICE A DAY 04/21/20   Vivienne Delon M, PA-C  Lactobacillus (PROBIOTIC ACIDOPHILUS PO) Take 1 capsule by mouth daily.    [provider]  levocetirizine (XYZAL) 5 MG tablet TAKE 1 TABLET BY MOUTH DAILY Patient taking differently: every morning. 10/01/21   Gasper Nancyann BRAVO,  MD  metFORMIN  (GLUCOPHAGE ) 500 MG tablet TAKE ONE TABLET BY MOUTH TWICE DAILY WITH A MEAL 11/29/21   Gasper Nancyann BRAVO, MD  montelukast (SINGULAIR) 10 MG tablet TAKE 1 TABLET BY MOUTH DAILY 06/30/20   Burnette, Jennifer M, PA-C  Multiple Vitamins-Minerals (HAIR/SKIN/NAILS) TABS Take 1 tablet by mouth in the morning and at bedtime.    [provider]  Omega-3 Fatty Acids (FISH OIL) 500 MG CAPS Take 500 mg by mouth daily.    [provider]  ondansetron  (ZOFRAN -ODT) 4 MG disintegrating tablet Take 1 tablet (4 mg total) by mouth every 6 (six) hours as  needed for nausea or vomiting. 11/07/22   Ward, Josette SAILOR, DO  pantoprazole  (PROTONIX ) 40 MG tablet Take 1 tablet (40 mg total) by mouth daily. 11/07/22 11/07/23  Ward, Josette SAILOR, DO  pravastatin  (PRAVACHOL ) 40 MG tablet Take 40 mg by mouth at bedtime. 10/20/20   [provider]  ramipril  (ALTACE ) 5 MG capsule Take 5 mg by mouth at bedtime. 10/20/20 10/27/21  [provider]  risankizumab -rzaa (SKYRIZI  PEN) 150 MG/ML pen Inject 1 mL (150 mg total) into the skin as directed. Every 12 weeks for maintenance. 10/10/23   Claudene Lehmann, MD  SUPER B COMPLEX & C TABS Take 1 tablet by mouth daily.    [provider]  Tapinarof  (VTAMA ) 1 % CREA Apply 1 application topically daily. 11/09/21   Moye, Virginia , MD  triamcinolone  cream (KENALOG ) 0.1 % USE TOPICALLY TWICE DAILY AS NEEDED 05/24/21   Chrismon, Marinda BRAVO, PA-C  UNABLE TO FIND Total Beets    [provider]    Allergies: Ibuprofen and Nsaids    Review of Systems  All other systems reviewed and are negative.   Updated Vital Signs BP (!) 149/66   Pulse 66   Temp 98.1 F (36.7 C) (Oral)   Resp 16   SpO2 97%   Physical Exam Vitals and nursing note reviewed.  Constitutional:      General: She is not in acute distress.    Appearance: She is well-developed.  HENT:     Head: Atraumatic.  Eyes:     Conjunctiva/sclera: Conjunctivae normal.  Cardiovascular:     Rate and Rhythm: Normal rate and regular rhythm.     Pulses: Normal pulses.     Heart sounds: Murmur heard.  Pulmonary:     Effort: Pulmonary effort is normal.     Breath sounds: No wheezing, rhonchi or rales.  Abdominal:     Palpations: Abdomen is soft.     Tenderness: There is abdominal tenderness.  Musculoskeletal:        General: No tenderness (No significant tenderness to palpation of left hand and left leg without any overlying skin changes.).     Cervical back: Neck supple.  Skin:    Capillary Refill: Capillary refill takes less than 2  seconds.     Findings: No rash.  Neurological:     Mental Status: She is alert and oriented to person, place, and time.  Psychiatric:        Mood and Affect: Mood normal.     (all labs ordered are listed, but only abnormal results are displayed) Labs Reviewed  COMPREHENSIVE METABOLIC PANEL WITH GFR - Abnormal; Notable for the following components:      Result Value   Glucose, Bld 134 (*)    All other components within normal limits  CBC WITH DIFFERENTIAL/PLATELET  CK    EKG: EKG Interpretation Date/Time:  Saturday June 29 2024 11:57:50 EDT Ventricular Rate:  67 PR Interval:  149 QRS Duration:  82 QT Interval:  374 QTC Calculation: 395 R Axis:   20  Text Interpretation: Sinus rhythm Low voltage, precordial leads Probable anteroseptal infarct, old Confirmed by Jerrol Agent ([id number]) on 06/29/2024 1:18:38 PM  Radiology: No results found.   Procedures   Medications Ordered in the ED - No data to display                                  Medical Decision Making Amount and/or Complexity of Data Reviewed Labs: ordered. ECG/medicine tests: ordered.   BP (!) 149/66   Pulse 66   Temp 98.1 F (36.7 C) (Oral)   Resp 16   SpO2 97%   62:33 AM  73 year old female with history of hypertension, diabetes, anemia, B12 deficiency presenting with complaint of cramping.  Patient reports cramping in her left hand and left leg since last night.  She mention she has had cramps before but this feels more intense.  She also mention she recently had a bout of dizzy spells prompting cardiology visit and states she had a coronary CT scan done and I failed.  She just had a coronary stress test done and currently waiting for the result.  Due to her hand cramping she reached out to her doctor and was recommended to come to the ER.  Patient also reports about 2 weeks ago her cardiologist took her off Pravacol and placed her on Lipitor which is a new medication for her.  Currently she denies  having any chest pain shortness of breath lightheadedness dizziness nausea or dysuria.  No recent strenuous activities or heavy lifting.  She is right-hand dominant.  No history of PE or DVT.  Exam overall reassuring, no reproducible tenderness to left hand and left leg.  No swelling no overlying skin changes.  Grip strength equal bilaterally radial pulse 2+.   Patient was started on a new statin medication and now having cramping will check CK to rule out rhabdomyolysis or myositis.  EKG ordered as patient initially voiced concerns for cardiac cause leading to her hand cramping although her symptom is not suggestive of ACS.  -Labs ordered, independently viewed and interpreted by me.  Labs remarkable for normal CK, doubt rhabdomyolysis -The patient was maintained on a cardiac monitor.  I personally viewed and interpreted the cardiac monitored which showed an underlying rhythm of: NSR -Imaging not considered -This patient presents to the ED for concern of cramps, this involves an extensive number of treatment options, and is a complaint that carries with it a high risk of complications and morbidity.  The differential diagnosis includes electrolytes imbalance, rhabdomyolysis, myositis, strain, sprain, cellulitis, acs, stroke, dvt -Co morbidities that complicate the patient evaluation includes cad, dm, htn, b12 deficiency -Treatment includes observation -Reevaluation of the patient after these medicines showed that the patient improved.  Ihave low suspicion for acute emergent medical condition.  I have considered stroke but felt it is less likely as pt is without focal neuro deficit, no weakness, and sxs improving.  Pt have an appointment with her doctor in 3 days.  Recommend discussion if Lipitor is right for her as her sxs may be medication induced.  I have strict return precaution for any stroke like sxs -PCP office notes or outside notes reviewed -Discussion with attending Dr. Jerrol -Escalation  to admission/observation considered: patients feels much better, is  comfortable with discharge, and will follow up with PCP -Prescription medication considered, patient comfortable with home medication -Social Determinant of Health considered       Final diagnoses:  Cramps, extremity    ED Discharge Orders     None          Nivia Colon, PA-C 06/29/24 1343    Jerrol Agent, MD 06/29/24 1531

## 2024-06-29 NOTE — Discharge Instructions (Signed)
 You have been evaluated for your symptoms.  Fortunately your EKG and your labs are overall reassuring.  Please follow-up with your doctor as previously scheduled and discuss if Lipitor is right for you as statin medication may cause muscle cramps.  If you develop any weakness or numbness or if you have any concern please return to the ER for further assessment.

## 2024-06-29 NOTE — ED Triage Notes (Signed)
 Patient reports left hand and left leg cramping last night. States PCP told her to com in to get blood work.

## 2024-07-22 ENCOUNTER — Encounter (INDEPENDENT_AMBULATORY_CARE_PROVIDER_SITE_OTHER): Payer: Self-pay

## 2024-07-22 ENCOUNTER — Other Ambulatory Visit: Payer: Self-pay | Admitting: Pharmacy Technician

## 2024-07-22 ENCOUNTER — Other Ambulatory Visit: Payer: Self-pay | Admitting: Dermatology

## 2024-07-22 ENCOUNTER — Other Ambulatory Visit: Payer: Self-pay

## 2024-07-22 DIAGNOSIS — L409 Psoriasis, unspecified: Secondary | ICD-10-CM

## 2024-07-22 DIAGNOSIS — Z79899 Other long term (current) drug therapy: Secondary | ICD-10-CM

## 2024-07-22 MED ORDER — SKYRIZI PEN 150 MG/ML ~~LOC~~ SOAJ
150.0000 mg | SUBCUTANEOUS | 0 refills | Status: DC
  Filled 2024-07-23: qty 1, 84d supply, fill #0

## 2024-07-22 NOTE — Progress Notes (Signed)
 Specialty Pharmacy Refill Coordination Note  Virginia Weaver is a 73 y.o. female contacted today regarding refills of specialty medication(s) Risankizumab -rzaa (Skyrizi  Pen)   Patient requested (Patient-Rptd) Delivery   Delivery date: 07/30/24 Verified address: (Patient-Rptd) [address]   Medication will be filled on 07/29/24.   RR sent to MD.

## 2024-07-23 ENCOUNTER — Other Ambulatory Visit (HOSPITAL_COMMUNITY): Payer: Self-pay

## 2024-07-23 ENCOUNTER — Other Ambulatory Visit: Payer: Self-pay

## 2024-08-28 ENCOUNTER — Ambulatory Visit: Payer: BC Managed Care – PPO | Admitting: Dermatology

## 2024-08-29 ENCOUNTER — Encounter: Payer: Self-pay | Admitting: Dermatology

## 2024-08-29 ENCOUNTER — Ambulatory Visit: Admitting: Dermatology

## 2024-08-29 DIAGNOSIS — Z79899 Other long term (current) drug therapy: Secondary | ICD-10-CM

## 2024-08-29 DIAGNOSIS — L814 Other melanin hyperpigmentation: Secondary | ICD-10-CM | POA: Diagnosis not present

## 2024-08-29 DIAGNOSIS — W908XXA Exposure to other nonionizing radiation, initial encounter: Secondary | ICD-10-CM | POA: Diagnosis not present

## 2024-08-29 DIAGNOSIS — Z1283 Encounter for screening for malignant neoplasm of skin: Secondary | ICD-10-CM | POA: Diagnosis not present

## 2024-08-29 DIAGNOSIS — L578 Other skin changes due to chronic exposure to nonionizing radiation: Secondary | ICD-10-CM

## 2024-08-29 DIAGNOSIS — D229 Melanocytic nevi, unspecified: Secondary | ICD-10-CM

## 2024-08-29 DIAGNOSIS — L409 Psoriasis, unspecified: Secondary | ICD-10-CM

## 2024-08-29 DIAGNOSIS — D1801 Hemangioma of skin and subcutaneous tissue: Secondary | ICD-10-CM

## 2024-08-29 DIAGNOSIS — L821 Other seborrheic keratosis: Secondary | ICD-10-CM

## 2024-08-29 DIAGNOSIS — Z7189 Other specified counseling: Secondary | ICD-10-CM

## 2024-08-29 MED ORDER — SKYRIZI PEN 150 MG/ML ~~LOC~~ SOAJ: 150.0000 mg | SUBCUTANEOUS | 3 refills | Status: DC

## 2024-08-29 NOTE — Progress Notes (Signed)
 Follow-Up Visit   Subjective  Virginia Weaver is a 73 y.o. female who presents for the following: Skin Cancer Screening and Full Body Skin Exam  The patient presents for Total-Body Skin Exam (TBSE) for skin cancer screening and mole check. The patient has spots, moles and lesions to be evaluated, some may be new or changing and the patient may have concern these could be cancer.  No hx skin cancer. Patient is on Skyrizi  for scalp psoriasis and is very much improved. Rash at back of neck but better, break out a left foot sometimes.   The following portions of the chart were reviewed this encounter and updated as appropriate: medications, allergies, medical history  Review of Systems:  No other skin or systemic complaints except as noted in HPI or Assessment and Plan.  Objective  Well appearing patient in no apparent distress; mood and affect are within normal limits.  A full examination was performed including scalp, head, eyes, ears, nose, lips, neck, chest, axillae, abdomen, back, buttocks, bilateral upper extremities, bilateral lower extremities, hands, feet, fingers, toes, fingernails, and toenails. All findings within normal limits unless otherwise noted below.   Relevant physical exam findings are noted in the Assessment and Plan.    Assessment & Plan   SKIN CANCER SCREENING PERFORMED TODAY.  ACTINIC DAMAGE - Chronic condition, secondary to cumulative UV/sun exposure - diffuse scaly erythematous macules with underlying dyspigmentation - Recommend daily broad spectrum sunscreen SPF 30+ to sun-exposed areas, reapply every 2 hours as needed.  - Staying in the shade or wearing long sleeves, sun glasses (UVA+UVB protection) and wide brim hats (4-inch brim around the entire circumference of the hat) are also recommended for sun protection.  - Call for new or changing lesions.  LENTIGINES, SEBORRHEIC KERATOSES, HEMANGIOMAS - Benign normal skin lesions - Benign-appearing - Call  for any changes  MELANOCYTIC NEVI - Tan-brown and/or pink-flesh-colored symmetric macules and papules - Benign appearing on exam today - Observation - Call clinic for new or changing moles - Recommend daily use of broad spectrum spf 30+ sunscreen to sun-exposed areas.   PSORIASIS on Systemic Treatment Exam: clear 0% BSA.  Chronic and persistent condition with duration or expected duration over one year. Well controlled   ordered Quantiferon-TB today  Psoriasis - severe on systemic treatment.  Psoriasis is a chronic non-curable, but treatable genetic/hereditary disease that may have other systemic features affecting other organ systems such as joints (Psoriatic Arthritis).  It is linked with heart disease, inflammatory bowel disease, non-alcoholic fatty liver disease, and depression. Significant skin psoriasis and/or psoriatic arthritis may have significant symptoms and affects activities of daily activity and often benefits from systemic treatments.  These systemic treatments have some potential side effects including immunosuppression and require pre-treatment laboratory screening and periodic laboratory monitoring and periodic in person evaluation and monitoring by the attending dermatologist physician (long term medication management).    Treatment Plan: Continue Skyrizi  150 mg SQ Q12 wks  Reviewed risks of biologics including immunosuppression, infections (i.e. TB reactivation), injection site reaction, and failure to improve condition. Goal is control of skin condition, not cure.  Some older biologics such as Humira and Enbrel may slightly increase risk of malignancy and may worsen congestive heart failure.  Taltz, Cosentyx, and Bimzelx may cause inflammatory bowel disease to flare or may increase incidence of yeast infections. Skyrizi , Tremfya, and Stelara may also slightly increase risk of infection. The use of biologics requires long term medication management, including periodic  office visits, annual  TB screening test and monitoring of blood work.  Monitoring for Tuberculosis Infection:  Patient is currently receiving biologic therapy for psoriasis, which carries a potential risk for reactivation of tuberculosis infection. As part of ongoing safety monitoring, a QuantiFERON-TB Gold (QFT-G) test is checked prior to initiation of biologic therapy and yearly per guidelines. - Most recent QFT-G result:    - Will recheck yearly  - No signs or symptoms of active TB noted. - Will continue to monitor per standard biologic safety protocol. - If QFT-G becomes positive or patient develops symptoms suggestive of TB, appropriate evaluation will be initiated  Long term medication management.  Patient is using long term (months to years) prescription medication  to control their dermatologic condition.  These medications require periodic monitoring to evaluate for efficacy and side effects and may require periodic laboratory monitoring.      MULTIPLE BENIGN NEVI   LENTIGINES   ACTINIC ELASTOSIS   CHERRY ANGIOMA   SEBORRHEIC KERATOSES   PSORIASIS   Related Procedures QuantiFERON-TB Gold Plus Related Medications triamcinolone  cream (KENALOG ) 0.1 % USE TOPICALLY TWICE DAILY AS NEEDED Tapinarof  (VTAMA ) 1 % CREA Apply 1 application topically daily. clobetasol  (TEMOVATE ) 0.05 % external solution Apply once or twice daily to affected areas on scalp as needed for itching. Avoid applying to face, groin, and axilla. risankizumab -rzaa (SKYRIZI  PEN) 150 MG/ML pen Inject 1 mL (150 mg total) into the skin as directed. Every 12 weeks for maintenance. LONG-TERM USE OF HIGH-RISK MEDICATION   Related Procedures QuantiFERON-TB Gold Plus COUNSELING AND COORDINATION OF CARE   MEDICATION MANAGEMENT   ENCOUNTER FOR LONG-TERM (CURRENT) USE OF HIGH-RISK MEDICATION   Related Medications risankizumab -rzaa (SKYRIZI  PEN) 150 MG/ML pen Inject 1 mL (150 mg total) into the skin  as directed. Every 12 weeks for maintenance. Return in about 1 year (around 08/29/2025) for TBSE, with Dr. Claudene, Psoriasis.  [name] Lonell Drones, RMA, am acting as scribe for Boneta Claudene, MD .   Documentation: I have reviewed the above documentation for accuracy and completeness, and I agree with the above.  Boneta Claudene, MD

## 2024-08-29 NOTE — Patient Instructions (Signed)

## 2024-09-06 ENCOUNTER — Other Ambulatory Visit: Payer: Self-pay | Admitting: Internal Medicine

## 2024-09-06 DIAGNOSIS — Z1231 Encounter for screening mammogram for malignant neoplasm of breast: Secondary | ICD-10-CM

## 2024-09-15 ENCOUNTER — Encounter: Payer: Self-pay | Admitting: Dermatology

## 2024-09-16 ENCOUNTER — Other Ambulatory Visit: Payer: Self-pay

## 2024-09-16 DIAGNOSIS — L409 Psoriasis, unspecified: Secondary | ICD-10-CM

## 2024-09-16 DIAGNOSIS — Z79899 Other long term (current) drug therapy: Secondary | ICD-10-CM

## 2024-09-16 MED ORDER — SKYRIZI PEN 150 MG/ML ~~LOC~~ SOAJ
150.0000 mg | SUBCUTANEOUS | 3 refills | Status: AC
  Filled 2024-09-16 – 2024-10-25 (×4): qty 1, 84d supply, fill #0

## 2024-10-14 ENCOUNTER — Other Ambulatory Visit: Payer: Self-pay

## 2024-10-14 ENCOUNTER — Telehealth: Payer: Self-pay

## 2024-10-14 ENCOUNTER — Other Ambulatory Visit: Payer: Self-pay | Admitting: Specialist

## 2024-10-14 DIAGNOSIS — R918 Other nonspecific abnormal finding of lung field: Secondary | ICD-10-CM

## 2024-10-14 NOTE — Progress Notes (Signed)
 PA pending

## 2024-10-14 NOTE — Telephone Encounter (Signed)
 Pharmacy Patient Advocate Encounter   Received notification from Pt Calls Messages that prior authorization for Skyrizi  is required/requested.   Insurance verification completed.   The patient is insured through [facility].   Per test claim: PA required; PA submitted to above mentioned insurance via Latent Key/confirmation #/EOC [id number] Status is pending

## 2024-10-15 NOTE — Telephone Encounter (Signed)
 Pharmacy Patient Advocate Encounter  Received notification from [facility] that Prior Authorization for Skyrizi  has been APPROVED from 10/14/24 to 10/14/25   PA #/Case ID/Reference #: [id number]

## 2024-10-16 ENCOUNTER — Other Ambulatory Visit (HOSPITAL_COMMUNITY): Payer: Self-pay

## 2024-10-21 ENCOUNTER — Other Ambulatory Visit: Payer: Self-pay

## 2024-10-23 ENCOUNTER — Other Ambulatory Visit (HOSPITAL_COMMUNITY): Payer: Self-pay

## 2024-10-25 ENCOUNTER — Other Ambulatory Visit: Payer: Self-pay

## 2024-10-25 NOTE — Progress Notes (Signed)
 Specialty Pharmacy Refill Coordination Note  Virginia Weaver is a 74 y.o. female contacted today regarding refills of specialty medication(s) Risankizumab -rzaa (Skyrizi  Pen)   Patient requested Delivery   Delivery date: 10/31/24   Verified address: [address], [place], [address]   Medication will be filled on: 10/30/24

## 2024-11-07 ENCOUNTER — Ambulatory Visit
Admission: RE | Admit: 2024-11-07 | Discharge: 2024-11-07 | Disposition: A | Source: Ambulatory Visit | Attending: Specialist | Admitting: Specialist

## 2024-11-07 DIAGNOSIS — R918 Other nonspecific abnormal finding of lung field: Secondary | ICD-10-CM

## 2024-11-20 ENCOUNTER — Ambulatory Visit: Admitting: Physician Assistant

## 2024-11-20 VITALS — BP 152/69 | HR 58 | Ht 64.0 in | Wt 160.0 lb

## 2024-11-20 DIAGNOSIS — N3020 Other chronic cystitis without hematuria: Secondary | ICD-10-CM | POA: Diagnosis not present

## 2024-11-20 DIAGNOSIS — R10A2 Flank pain, left side: Secondary | ICD-10-CM | POA: Diagnosis not present

## 2024-11-20 LAB — URINALYSIS, COMPLETE
Bilirubin, UA: NEGATIVE
Glucose, UA: NEGATIVE
Ketones, UA: NEGATIVE
Nitrite, UA: NEGATIVE
Specific Gravity, UA: 1.015 (ref 1.005–1.030)
Urobilinogen, Ur: 0.2 mg/dL (ref 0.2–1.0)
pH, UA: 7.5 (ref 5.0–7.5)

## 2024-11-20 LAB — MICROSCOPIC EXAMINATION: RBC, Urine: >30 /HPF — AB (ref 0–2)

## 2024-11-20 MED ORDER — HYDROXYZINE HCL 10 MG PO TABS: 10.0000 mg | ORAL_TABLET | Freq: Every day | ORAL | 0 refills | Status: AC

## 2024-11-20 NOTE — Progress Notes (Signed)
 "  11/20/2024 11:41 AM   Virginia Weaver 1950-12-24 [id number]  CC: Chief Complaint  Patient presents with   Hematuria   HPI: Virginia Weaver is a 74 y.o. female with PMH breast cancer, chronic cystitis, OAB, possible low-grade right UPJ obstruction, and microscopic hematuria with benign bladder biopsies in 2023 and cystoscopy with Dr. Penne in April 2025 who presents today for evaluation of possible UTI.   Today she reports sudden onset gross hematuria and dysuria this morning without fever, chills, nausea, or vomiting.  She has been having some left flank discomfort, though this is not atypical for her.  No known stone history.    Prior bladder biopsy with Dr. Penne was notable for chronic cystitis with rare eosinophils and patchy lymphoid aggregates.  She is not on any antihistamines at this time.  In-office UA today positive for red color, trace protein, 3+ blood, and trace leukocyte; urine microscopy with >30 RBCs/HPF.  PMH: Past Medical History:  Diagnosis Date   Acid reflux 04/02/2001   Adiposity 04/20/1993   Anemia    h/o   Arthritis    Benign essential HTN 11/11/1993   Carotid atherosclerosis, bilateral    COVID-19 08/2021   Diabetes mellitus without complication (HCC)    diet    Heart murmur    Hypertension    RSV (respiratory syncytial virus infection) 09/2021   Syncope and collapse 01/20/2009   Tinnitus    left ear   Urticaria 06/04/2015    Surgical History: Past Surgical History:  Procedure Laterality Date   CYSTOSCOPY W/ RETROGRADES Bilateral 11/04/2021   Procedure: CYSTOSCOPY WITH RETROGRADE PYELOGRAM;  Surgeon: Penne Knee, MD;  Location: [facility];  Service: Urology;  Laterality: Bilateral;   CYSTOSCOPY WITH BIOPSY N/A 12/22/2015   Procedure: CYSTOSCOPY WITH BIOPSY;  Surgeon: Knee Penne, MD;  Location: [facility];  Service: Urology;  Laterality: N/A;   CYSTOSCOPY WITH BIOPSY N/A 11/04/2021   Procedure: CYSTOSCOPY WITH BLADDER  BIOPSY;   Surgeon: Penne Knee, MD;  Location: [facility];  Service: Urology;  Laterality: N/A;   CYSTOSCOPY WITH FULGERATION N/A 11/04/2021   Procedure: CYSTOSCOPY WITH FULGERATION;  Surgeon: Penne Knee, MD;  Location: [facility];  Service: Urology;  Laterality: N/A;   TUBAL LIGATION  1987   TUMMY TUCK  06/17/2010   URETEROSCOPY Right 11/04/2021   Procedure: DIAGNOSTIC URETEROSCOPY;  Surgeon: Penne Knee, MD;  Location: [facility];  Service: Urology;  Laterality: Right;    Home Medications:  Allergies as of 11/20/2024       Reactions   Ibuprofen    Other reaction(s): Abdominal Pain Can't take NSAIDS secondary to erosions due to NSAIDS.   Nsaids    Can't take NSAIDS secondary to erosions due to NSAIDS.        Medication List        Accurate as of November 20, 2024 11:41 AM. If you have any questions, ask your nurse or doctor.          STOP taking these medications    clobetasol  0.05 % external solution Commonly known as: TEMOVATE    gabapentin  100 MG capsule Commonly known as: NEURONTIN    HYDROcodone -acetaminophen  5-325 MG tablet Commonly known as: NORCO/VICODIN   ondansetron  4 MG disintegrating tablet Commonly known as: ZOFRAN -ODT       TAKE these medications    ALPRAZolam  0.5 MG tablet Commonly known as: XANAX  Take 1 tablet (0.5 mg total) by mouth daily as needed for up to 45 doses for anxiety (may repeat dose if needed;  max of 15 times/month). What changed: when to take this   atorvastatin 40 MG tablet Commonly known as: LIPITOR Take 40 mg by mouth daily.   calcium carbonate 600 MG Tabs tablet Commonly known as: OS-CAL Take 1 tablet by mouth 2 (two) times daily with a meal.   Cholecalciferol 25 MCG (1000 UT) capsule Take 1,000 Units by mouth daily.   Cinnamon 500 MG capsule Take 1,000 mg by mouth 2 (two) times daily.   clopidogrel 75 MG tablet Commonly known as: PLAVIX Take 75 mg by mouth daily.   CoQ10 200 MG Caps Take 1 capsule by mouth daily.    docusate sodium 100 MG capsule Commonly known as: COLACE Take 100 mg by mouth daily.   ESTER C PO Take 1,000 mg by mouth daily.   Fish Oil 500 MG Caps Take 500 mg by mouth daily.   fluorouracil  5 % cream Commonly known as: EFUDEX  Apply to affected area on nose twice a day x 4 days   Hair/Skin/Nails Tabs Take 1 tablet by mouth in the morning and at bedtime.   hydrocortisone  25 MG suppository Commonly known as: ANUSOL -HC UNWRAP AND INSERT 1 SUPPOSITORY RECALLY TWICE A DAY   levocetirizine 5 MG tablet Commonly known as: XYZAL TAKE 1 TABLET BY MOUTH DAILY What changed:  how much to take how to take this when to take this   metFORMIN  500 MG tablet Commonly known as: GLUCOPHAGE  TAKE ONE TABLET BY MOUTH TWICE DAILY WITH A MEAL   metoprolol tartrate 25 MG tablet Commonly known as: LOPRESSOR Take 25 mg by mouth 2 (two) times daily.   montelukast 10 MG tablet Commonly known as: SINGULAIR TAKE 1 TABLET BY MOUTH DAILY   pantoprazole  40 MG tablet Commonly known as: Protonix  Take 1 tablet (40 mg total) by mouth daily.   pravastatin  40 MG tablet Commonly known as: PRAVACHOL  Take 40 mg by mouth at bedtime.   PREVAGEN PO Take by mouth.   PROBIOTIC ACIDOPHILUS PO Take 1 capsule by mouth daily.   ramipril  5 MG capsule Commonly known as: ALTACE  Take 5 mg by mouth at bedtime.   Skyrizi  Pen 150 MG/ML pen Generic drug: risankizumab -rzaa Inject 1 mL (150 mg total) into the skin as directed. Every 12 weeks for maintenance.   SUPER B COMPLEX & C Tabs Take 1 tablet by mouth daily.   triamcinolone  cream 0.1 % Commonly known as: KENALOG  USE TOPICALLY TWICE DAILY AS NEEDED   UNABLE TO FIND Total Beets   Vtama  1 % Crea Generic drug: Tapinarof  Apply 1 application topically daily.        Allergies:  Allergies[1]  Family History: Family History  Problem Relation Age of Onset   Heart attack Mother    Stroke Mother    Diabetes Mother    Throat cancer Mother     Heart attack Father    Hypertension Father    Glaucoma Father    AAA (abdominal aortic aneurysm) Father    Obesity Sister    Throat cancer Maternal Grandmother    Heart attack Maternal Grandfather    CVA Paternal Grandmother    Congestive Heart Failure Paternal Grandmother    Heart attack Paternal Grandfather    Breast cancer Cousin     Social History:   reports that she has never smoked. She has never used smokeless tobacco. She reports that she does not drink alcohol and does not use drugs.  Physical Exam: BP (!) 152/69   Pulse (!) 58   Ht  5' 4 (1.626 m)   Wt 160 lb (72.6 kg)   SpO2 97%   BMI 27.46 kg/m   Constitutional:  Alert and oriented, no acute distress, nontoxic appearing HEENT: [place], AT Cardiovascular: No clubbing, cyanosis, or edema Respiratory: Normal respiratory effort, no increased work of breathing Skin: No rashes, bruises or suspicious lesions Neurologic: Grossly intact, no focal deficits, moving all 4 extremities Psychiatric: Normal mood and affect  Laboratory Data: Results for orders placed or performed in visit on 11/20/24  Microscopic Examination   Collection Time: 11/20/24 11:35 AM   Urine  Result Value Ref Range   WBC, UA 0-5 0 - 5 /hpf   RBC, Urine >30 (A) 0 - 2 /hpf   Epithelial Cells (non renal) 0-10 0 - 10 /hpf   Bacteria, UA Few None seen/Few  Urinalysis, Complete   Collection Time: 11/20/24 11:35 AM  Result Value Ref Range   Specific Gravity, UA 1.015 1.005 - 1.030   pH, UA 7.5 5.0 - 7.5   Color, UA Red (A) Yellow   Appearance Ur Slightly cloudy Clear   Leukocytes,UA Trace (A) Negative   Protein,UA Trace Negative/Trace   Glucose, UA Negative Negative   Ketones, UA Negative Negative   RBC, UA 3+ (A) Negative   Bilirubin, UA Negative Negative   Urobilinogen, Ur 0.2 0.2 - 1.0 mg/dL   Nitrite, UA Negative Negative   Microscopic Examination See below:    Assessment & Plan:   1. Chronic cystitis (Primary) UA today notable only for  hematuria, low suspicion for infectious etiology of her symptoms.  I will send for urine cytology today, and will defer repeat cystoscopy unless any evidence of malignancy with this.  I am starting her on p.m. hydroxyzine  to manage more of an eosinophilic cystitis picture. - Urinalysis, Complete - CULTURE, URINE COMPREHENSIVE - Cytology Plus Monitoring Profile: Pap & Feulgen - hydrOXYzine  (ATARAX ) 10 MG tablet; Take 1 tablet (10 mg total) by mouth at bedtime.  Dispense: 30 tablet; Refill: 0  2. Left flank pain Unclear if related, no history of stones.  Will get a renal ultrasound and see her back in a month for recheck. - US  RENAL; Future   Return in about 4 weeks (around 12/18/2024) for Sx recheck and RUS results.  Lucie Hones, PA-C  [facility] [place] [address] [place], [place] [address] [phone]      [1]  Allergies Allergen Reactions   Ibuprofen     Other reaction(s): Abdominal Pain Can't take NSAIDS secondary to erosions due to NSAIDS.   Nsaids     Can't take NSAIDS secondary to erosions due to NSAIDS.   "

## 2024-11-20 NOTE — Patient Instructions (Signed)
 Ultrasound scheduling: [phone]

## 2024-11-23 LAB — CULTURE, URINE COMPREHENSIVE

## 2024-11-27 ENCOUNTER — Ambulatory Visit: Payer: Self-pay | Admitting: Physician Assistant

## 2024-11-27 ENCOUNTER — Ambulatory Visit

## 2024-11-27 ENCOUNTER — Other Ambulatory Visit: Payer: Self-pay

## 2024-11-27 LAB — "CYTOLOGY PLUS MONITORING PROFILE: PAP & FEULGEN "

## 2024-11-27 MED ORDER — FOSFOMYCIN TROMETHAMINE 3 G PO PACK: 3.0000 g | PACK | Freq: Once | ORAL | 0 refills | Status: AC

## 2024-11-27 NOTE — Progress Notes (Signed)
 Called patient an patient understood. Forfonycin was sent in.

## 2024-12-03 ENCOUNTER — Ambulatory Visit

## 2024-12-18 ENCOUNTER — Ambulatory Visit: Admitting: Physician Assistant

## 2025-02-10 ENCOUNTER — Ambulatory Visit: Admitting: Urology

## 2025-04-01 ENCOUNTER — Encounter

## 2025-09-01 ENCOUNTER — Ambulatory Visit: Admitting: Dermatology
